# Patient Record
Sex: Male | Born: 1965
Health system: Southern US, Community
[De-identification: ages and names within clinical notes are randomized; demographics above are authoritative.]

## PROBLEM LIST (undated history)

## (undated) DIAGNOSIS — H269 Unspecified cataract: Secondary | ICD-10-CM

## (undated) DIAGNOSIS — R12 Heartburn: Secondary | ICD-10-CM

## (undated) DIAGNOSIS — T7840XA Allergy, unspecified, initial encounter: Secondary | ICD-10-CM

## (undated) DIAGNOSIS — Z87442 Personal history of urinary calculi: Secondary | ICD-10-CM

## (undated) DIAGNOSIS — M2550 Pain in unspecified joint: Secondary | ICD-10-CM

## (undated) DIAGNOSIS — Z8669 Personal history of other diseases of the nervous system and sense organs: Secondary | ICD-10-CM

## (undated) DIAGNOSIS — M1990 Unspecified osteoarthritis, unspecified site: Secondary | ICD-10-CM

## (undated) DIAGNOSIS — Z8619 Personal history of other infectious and parasitic diseases: Secondary | ICD-10-CM

## (undated) DIAGNOSIS — H3552 Pigmentary retinal dystrophy: Secondary | ICD-10-CM

## (undated) DIAGNOSIS — I10 Essential (primary) hypertension: Secondary | ICD-10-CM

## (undated) DIAGNOSIS — K219 Gastro-esophageal reflux disease without esophagitis: Secondary | ICD-10-CM

## (undated) HISTORY — DX: Pain in unspecified joint: M25.50

## (undated) HISTORY — DX: Allergy, unspecified, initial encounter: T78.40XA

## (undated) HISTORY — DX: Heartburn: R12

## (undated) HISTORY — PX: EYE SURGERY: SHX253

## (undated) HISTORY — DX: Pigmentary retinal dystrophy: H35.52

## (undated) HISTORY — DX: Personal history of other infectious and parasitic diseases: Z86.19

## (undated) HISTORY — DX: Hereditary hemochromatosis: E83.110

## (undated) HISTORY — DX: Personal history of other diseases of the nervous system and sense organs: Z86.69

## (undated) HISTORY — PX: JOINT REPLACEMENT: SHX530

---
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
## 1999-02-03 ENCOUNTER — Ambulatory Visit (HOSPITAL_COMMUNITY): Admission: RE | Admit: 1999-02-03 | Discharge: 1999-02-03 | Payer: Self-pay | Admitting: Specialist

## 1999-02-17 ENCOUNTER — Ambulatory Visit (HOSPITAL_COMMUNITY): Admission: RE | Admit: 1999-02-17 | Discharge: 1999-02-17 | Payer: Self-pay | Admitting: Specialist

## 1999-06-14 HISTORY — PX: CATARACT EXTRACTION BILATERAL W/ ANTERIOR VITRECTOMY: SHX1304

## 2006-05-16 ENCOUNTER — Encounter: Admission: RE | Admit: 2006-05-16 | Discharge: 2006-05-16 | Payer: Self-pay

## 2006-06-09 ENCOUNTER — Encounter: Admission: RE | Admit: 2006-06-09 | Discharge: 2006-06-09 | Payer: Self-pay | Admitting: Orthopedic Surgery

## 2007-02-05 ENCOUNTER — Encounter: Admission: RE | Admit: 2007-02-05 | Discharge: 2007-02-05 | Payer: Self-pay | Admitting: Orthopedic Surgery

## 2012-01-13 ENCOUNTER — Encounter (INDEPENDENT_AMBULATORY_CARE_PROVIDER_SITE_OTHER): Payer: Medicare Other | Admitting: Ophthalmology

## 2012-01-13 DIAGNOSIS — H27139 Posterior dislocation of lens, unspecified eye: Secondary | ICD-10-CM

## 2012-01-13 DIAGNOSIS — H2700 Aphakia, unspecified eye: Secondary | ICD-10-CM

## 2012-01-13 DIAGNOSIS — H43819 Vitreous degeneration, unspecified eye: Secondary | ICD-10-CM

## 2012-01-13 DIAGNOSIS — H35459 Secondary pigmentary degeneration, unspecified eye: Secondary | ICD-10-CM

## 2012-01-13 NOTE — H&P (Signed)
Michael Vaughan is an 46 y.o. male.   Chief Complaint:sudden loss of vision left eye HPI: Intra ocular lens dislocation left eye  Retinitis pigmentosa OU  No past surgical history on file.  No family history on file. Social History:  does not have a smoking history on file. He does not have any smokeless tobacco history on file. His alcohol and drug histories not on file.  Allergies: Allergies not on file  No prescriptions prior to admission    Review of systems otherwise negative  There were no vitals taken for this visit.  Physical exam: Mental status: oriented x3. Eyes: See eye exam associated with this date of surgery in media tab.  Scanned in by scanning center Ears, Nose, Throat: within normal limits Neck: Within Normal limits General: within normal limits Chest: Within normal limits Breast: deferred Heart: Within normal limits Abdomen: Within normal limits GU: deferred Extremities: within normal limits Skin: within normal limits  Assessment/Plan Dislocated IntraOcular Lens left eye Plan: To [facility] for Pars plana vitrectomy, removal of intraocular lens from vitreous,placement of secondary intraocular lens with suture.  Laser therapy.  Sherrie George 01/13/2012, 4:27 PM

## 2012-01-23 ENCOUNTER — Encounter (HOSPITAL_COMMUNITY): Payer: Self-pay

## 2012-01-23 MED ORDER — CEFAZOLIN SODIUM-DEXTROSE 2-3 GM-% IV SOLR
2.0000 g | INTRAVENOUS | Status: DC
  Filled 2012-01-23: qty 50

## 2012-01-24 ENCOUNTER — Ambulatory Visit (HOSPITAL_COMMUNITY): Payer: Medicare Other | Admitting: *Deleted

## 2012-01-24 ENCOUNTER — Ambulatory Visit (HOSPITAL_COMMUNITY)
Admission: RE | Admit: 2012-01-24 | Discharge: 2012-01-25 | Disposition: A | Discharge status: Home / Self Care | Payer: Medicare Other | Source: Ambulatory Visit | Attending: Ophthalmology | Admitting: Ophthalmology

## 2012-01-24 ENCOUNTER — Encounter (HOSPITAL_COMMUNITY): Payer: Self-pay | Admitting: Surgery

## 2012-01-24 ENCOUNTER — Encounter (HOSPITAL_COMMUNITY): Payer: Self-pay | Admitting: *Deleted

## 2012-01-24 ENCOUNTER — Encounter (HOSPITAL_COMMUNITY)
Admission: RE | Disposition: A | Discharge status: Home / Self Care | Payer: Self-pay | Source: Ambulatory Visit | Attending: Ophthalmology

## 2012-01-24 ENCOUNTER — Ambulatory Visit (HOSPITAL_COMMUNITY): Payer: Medicare Other

## 2012-01-24 DIAGNOSIS — H27139 Posterior dislocation of lens, unspecified eye: Secondary | ICD-10-CM

## 2012-01-24 DIAGNOSIS — H43819 Vitreous degeneration, unspecified eye: Secondary | ICD-10-CM | POA: Insufficient documentation

## 2012-01-24 DIAGNOSIS — Y831 Surgical operation with implant of artificial internal device as the cause of abnormal reaction of the patient, or of later complication, without mention of misadventure at the time of the procedure: Secondary | ICD-10-CM | POA: Insufficient documentation

## 2012-01-24 DIAGNOSIS — T8522XA Displacement of intraocular lens, initial encounter: Secondary | ICD-10-CM | POA: Diagnosis present

## 2012-01-24 DIAGNOSIS — H3552 Pigmentary retinal dystrophy: Secondary | ICD-10-CM | POA: Insufficient documentation

## 2012-01-24 DIAGNOSIS — H43399 Other vitreous opacities, unspecified eye: Secondary | ICD-10-CM | POA: Insufficient documentation

## 2012-01-24 DIAGNOSIS — T8529XA Other mechanical complication of intraocular lens, initial encounter: Secondary | ICD-10-CM | POA: Insufficient documentation

## 2012-01-24 HISTORY — DX: Unspecified osteoarthritis, unspecified site: M19.90

## 2012-01-24 HISTORY — PX: PARS PLANA VITRECTOMY: SHX2166

## 2012-01-24 HISTORY — DX: Unspecified cataract: H26.9

## 2012-01-24 LAB — CBC
Hemoglobin: 15 g/dL (ref 13.0–17.0)
MCH: 32.2 pg (ref 26.0–34.0)
MCHC: 35.1 g/dL (ref 30.0–36.0)
MCV: 91.6 fL (ref 78.0–100.0)
Platelets: 261 10*3/uL (ref 150–400)
RBC: 4.66 MIL/uL (ref 4.22–5.81)
RDW: 13.1 % (ref 11.5–15.5)
WBC: 9.9 10*3/uL (ref 4.0–10.5)

## 2012-01-24 LAB — SURGICAL PCR SCREEN
MRSA, PCR: NEGATIVE
Staphylococcus aureus: NEGATIVE

## 2012-01-24 SURGERY — PARS PLANA VITRECTOMY WITH 25G REMOVAL/SUTURE INTRAOCULAR LENS
Anesthesia: General | Site: Eye | Laterality: Left | Wound class: Clean

## 2012-01-24 MED ORDER — ACETAZOLAMIDE SODIUM 500 MG IJ SOLR
500.0000 mg | Freq: Once | INTRAMUSCULAR | Status: AC
  Administered 2012-01-25: 500 mg via INTRAVENOUS
  Filled 2012-01-24: qty 500

## 2012-01-24 MED ORDER — TRIAMCINOLONE ACETONIDE 40 MG/ML IJ SUSP
INTRAMUSCULAR | Status: AC
  Filled 2012-01-24: qty 1

## 2012-01-24 MED ORDER — TROPICAMIDE 1 % OP SOLN
OPHTHALMIC | Status: AC
  Administered 2012-01-24: 1 [drp] via OPHTHALMIC
  Filled 2012-01-24: qty 3

## 2012-01-24 MED ORDER — HYDROMORPHONE HCL PF 1 MG/ML IJ SOLN
INTRAMUSCULAR | Status: AC
  Administered 2012-01-24: 0.5 mg via INTRAVENOUS
  Filled 2012-01-24: qty 1

## 2012-01-24 MED ORDER — GLYCOPYRROLATE 0.2 MG/ML IJ SOLN
INTRAMUSCULAR | Status: DC | PRN
  Administered 2012-01-24: .5 mg via INTRAVENOUS

## 2012-01-24 MED ORDER — BSS PLUS IO SOLN
INTRAOCULAR | Status: AC
  Filled 2012-01-24: qty 500

## 2012-01-24 MED ORDER — LIDOCAINE HCL 4 % MT SOLN
OROMUCOSAL | Status: DC | PRN
  Administered 2012-01-24: 4 mL via TOPICAL

## 2012-01-24 MED ORDER — BUPIVACAINE HCL 0.75 % IJ SOLN
INTRAMUSCULAR | Status: DC | PRN
  Administered 2012-01-24: 10 mL

## 2012-01-24 MED ORDER — MORPHINE SULFATE 2 MG/ML IJ SOLN: 1.0000 mg | INTRAMUSCULAR | Status: DC | PRN

## 2012-01-24 MED ORDER — BRIMONIDINE TARTRATE 0.2 % OP SOLN
1.0000 [drp] | Freq: Two times a day (BID) | OPHTHALMIC | Status: DC
  Filled 2012-01-24: qty 5

## 2012-01-24 MED ORDER — ONDANSETRON HCL 4 MG/2ML IJ SOLN: 4.0000 mg | Freq: Once | INTRAMUSCULAR | Status: DC | PRN

## 2012-01-24 MED ORDER — POLYMYXIN B SULFATE 500000 UNITS IJ SOLR
INTRAMUSCULAR | Status: AC
  Filled 2012-01-24: qty 1

## 2012-01-24 MED ORDER — SODIUM HYALURONATE 10 MG/ML IO SOLN
INTRAOCULAR | Status: DC | PRN
  Administered 2012-01-24: 0.85 mL via INTRAOCULAR

## 2012-01-24 MED ORDER — BACITRACIN-POLYMYXIN B 500-10000 UNIT/GM OP OINT
TOPICAL_OINTMENT | OPHTHALMIC | Status: AC
  Filled 2012-01-24: qty 3.5

## 2012-01-24 MED ORDER — ACETAMINOPHEN 325 MG PO TABS: 325.0000 mg | ORAL_TABLET | ORAL | Status: DC | PRN

## 2012-01-24 MED ORDER — ONDANSETRON HCL 4 MG/2ML IJ SOLN: 4.0000 mg | Freq: Four times a day (QID) | INTRAMUSCULAR | Status: DC | PRN

## 2012-01-24 MED ORDER — ATROPINE SULFATE 1 % OP SOLN
OPHTHALMIC | Status: AC
  Filled 2012-01-24: qty 2

## 2012-01-24 MED ORDER — HYALURONIDASE HUMAN 150 UNIT/ML IJ SOLN
INTRAMUSCULAR | Status: AC
  Filled 2012-01-24: qty 1

## 2012-01-24 MED ORDER — LATANOPROST 0.005 % OP SOLN
1.0000 [drp] | Freq: Every day | OPHTHALMIC | Status: DC
  Filled 2012-01-24: qty 2.5

## 2012-01-24 MED ORDER — BSS IO SOLN
INTRAOCULAR | Status: DC | PRN
  Administered 2012-01-24: 500 mL via INTRAOCULAR

## 2012-01-24 MED ORDER — LIDOCAINE HCL (CARDIAC) 20 MG/ML IV SOLN
INTRAVENOUS | Status: DC | PRN
  Administered 2012-01-24: 60 mg via INTRAVENOUS

## 2012-01-24 MED ORDER — TEMAZEPAM 15 MG PO CAPS: 15.0000 mg | ORAL_CAPSULE | Freq: Every evening | ORAL | Status: DC | PRN

## 2012-01-24 MED ORDER — BSS PLUS IO SOLN
INTRAOCULAR | Status: DC | PRN
  Administered 2012-01-24: 1 via OPHTHALMIC

## 2012-01-24 MED ORDER — NEOSTIGMINE METHYLSULFATE 1 MG/ML IJ SOLN
INTRAMUSCULAR | Status: DC | PRN
  Administered 2012-01-24: 3 mg via INTRAVENOUS

## 2012-01-24 MED ORDER — BACITRACIN-POLYMYXIN B 500-10000 UNIT/GM OP OINT
1.0000 "application " | TOPICAL_OINTMENT | Freq: Four times a day (QID) | OPHTHALMIC | Status: DC
  Filled 2012-01-24: qty 3.5

## 2012-01-24 MED ORDER — DOCUSATE SODIUM 100 MG PO CAPS: 100.0000 mg | ORAL_CAPSULE | Freq: Two times a day (BID) | ORAL | Status: DC

## 2012-01-24 MED ORDER — DEXAMETHASONE SODIUM PHOSPHATE 10 MG/ML IJ SOLN
INTRAMUSCULAR | Status: AC
  Filled 2012-01-24: qty 1

## 2012-01-24 MED ORDER — CLINDAMYCIN PHOSPHATE 600 MG/50ML IV SOLN
INTRAVENOUS | Status: AC
  Filled 2012-01-24: qty 50

## 2012-01-24 MED ORDER — MAGNESIUM HYDROXIDE 400 MG/5ML PO SUSP: 15.0000 mL | Freq: Four times a day (QID) | ORAL | Status: DC | PRN

## 2012-01-24 MED ORDER — SODIUM CHLORIDE 0.9 % IJ SOLN
INTRAMUSCULAR | Status: DC | PRN
  Administered 2012-01-24: 10 mL

## 2012-01-24 MED ORDER — MINERAL OIL LIGHT 100 % EX OIL
TOPICAL_OIL | CUTANEOUS | Status: AC
  Filled 2012-01-24: qty 25

## 2012-01-24 MED ORDER — TROPICAMIDE 1 % OP SOLN
1.0000 [drp] | OPHTHALMIC | Status: AC | PRN
  Administered 2012-01-24 (×3): 1 [drp] via OPHTHALMIC

## 2012-01-24 MED ORDER — BUPIVACAINE HCL 0.75 % IJ SOLN
INTRAMUSCULAR | Status: AC
  Filled 2012-01-24: qty 10

## 2012-01-24 MED ORDER — EPINEPHRINE HCL 1 MG/ML IJ SOLN
INTRAMUSCULAR | Status: DC | PRN
  Administered 2012-01-24: 1 mg

## 2012-01-24 MED ORDER — TROPICAMIDE 1 % OP SOLN
OPHTHALMIC | Status: AC
  Filled 2012-01-24: qty 3

## 2012-01-24 MED ORDER — GENTAMICIN SULFATE 40 MG/ML IJ SOLN
INTRAMUSCULAR | Status: DC | PRN
  Administered 2012-01-24: 80 mg

## 2012-01-24 MED ORDER — ROCURONIUM BROMIDE 100 MG/10ML IV SOLN
INTRAVENOUS | Status: DC | PRN
  Administered 2012-01-24: 50 mg via INTRAVENOUS

## 2012-01-24 MED ORDER — GATIFLOXACIN 0.5 % OP SOLN: 1.0000 [drp] | OPHTHALMIC | Status: DC | PRN

## 2012-01-24 MED ORDER — SODIUM CHLORIDE 0.9 % IV SOLN
INTRAVENOUS | Status: DC | PRN
  Administered 2012-01-24: 12:00:00 via INTRAVENOUS

## 2012-01-24 MED ORDER — SODIUM CHLORIDE 0.9 % IV SOLN
INTRAVENOUS | Status: DC
  Administered 2012-01-24: 11:00:00 via INTRAVENOUS

## 2012-01-24 MED ORDER — HYPROMELLOSE (GONIOSCOPIC) 2.5 % OP SOLN
OPHTHALMIC | Status: AC
  Filled 2012-01-24: qty 15

## 2012-01-24 MED ORDER — GENTAMICIN SULFATE 40 MG/ML IJ SOLN
INTRAMUSCULAR | Status: AC
  Filled 2012-01-24: qty 2

## 2012-01-24 MED ORDER — CYCLOPENTOLATE HCL 1 % OP SOLN: 1.0000 [drp] | OPHTHALMIC | Status: DC | PRN

## 2012-01-24 MED ORDER — BSS IO SOLN
INTRAOCULAR | Status: AC
  Filled 2012-01-24: qty 15

## 2012-01-24 MED ORDER — MUPIROCIN 2 % EX OINT
TOPICAL_OINTMENT | CUTANEOUS | Status: AC
  Administered 2012-01-24: 1 via NASAL
  Filled 2012-01-24: qty 22

## 2012-01-24 MED ORDER — TETRACAINE HCL 0.5 % OP SOLN
2.0000 [drp] | Freq: Once | OPHTHALMIC | Status: DC
  Filled 2012-01-24: qty 2

## 2012-01-24 MED ORDER — MUPIROCIN 2 % EX OINT
TOPICAL_OINTMENT | Freq: Two times a day (BID) | CUTANEOUS | Status: DC
  Administered 2012-01-24: 1 via NASAL
  Filled 2012-01-24: qty 22

## 2012-01-24 MED ORDER — GATIFLOXACIN 0.5 % OP SOLN
OPHTHALMIC | Status: AC
  Filled 2012-01-24: qty 2.5

## 2012-01-24 MED ORDER — PROPOFOL 10 MG/ML IV EMUL
INTRAVENOUS | Status: DC | PRN
  Administered 2012-01-24: 200 mg via INTRAVENOUS

## 2012-01-24 MED ORDER — MIDAZOLAM HCL 5 MG/5ML IJ SOLN
INTRAMUSCULAR | Status: DC | PRN
  Administered 2012-01-24: 2 mg via INTRAVENOUS

## 2012-01-24 MED ORDER — TROPICAMIDE 1 % OP SOLN: 1.0000 [drp] | OPHTHALMIC | Status: DC | PRN

## 2012-01-24 MED ORDER — SODIUM CHLORIDE 0.45 % IV SOLN
INTRAVENOUS | Status: DC
  Administered 2012-01-24: 20 mL/h via INTRAVENOUS

## 2012-01-24 MED ORDER — HYDROMORPHONE HCL PF 1 MG/ML IJ SOLN
0.2500 mg | INTRAMUSCULAR | Status: DC | PRN
  Administered 2012-01-24 (×3): 0.5 mg via INTRAVENOUS

## 2012-01-24 MED ORDER — LIDOCAINE HCL 2 % IJ SOLN
INTRAMUSCULAR | Status: AC
  Filled 2012-01-24: qty 1

## 2012-01-24 MED ORDER — POLYMYXIN B SULFATE 500000 UNITS IJ SOLR
INTRAMUSCULAR | Status: DC | PRN
  Administered 2012-01-24: 500000 [IU]

## 2012-01-24 MED ORDER — ACETYLCHOLINE CHLORIDE 1:100 IO SOLR
INTRAOCULAR | Status: AC
  Filled 2012-01-24: qty 1

## 2012-01-24 MED ORDER — PHENYLEPHRINE HCL 2.5 % OP SOLN
1.0000 [drp] | OPHTHALMIC | Status: DC | PRN
  Administered 2012-01-24 (×2): 1 [drp] via OPHTHALMIC

## 2012-01-24 MED ORDER — CYCLOPENTOLATE HCL 1 % OP SOLN
OPHTHALMIC | Status: AC
  Administered 2012-01-24: 1 [drp] via OPHTHALMIC
  Filled 2012-01-24: qty 2

## 2012-01-24 MED ORDER — PHENYLEPHRINE HCL 2.5 % OP SOLN
OPHTHALMIC | Status: AC
  Administered 2012-01-24: 1 [drp] via OPHTHALMIC
  Filled 2012-01-24: qty 3

## 2012-01-24 MED ORDER — 0.9 % SODIUM CHLORIDE (POUR BTL) OPTIME
TOPICAL | Status: DC | PRN
  Administered 2012-01-24: 1000 mL

## 2012-01-24 MED ORDER — PHENYLEPHRINE HCL 2.5 % OP SOLN
1.0000 [drp] | OPHTHALMIC | Status: DC | PRN
  Administered 2012-01-24: 1 [drp] via OPHTHALMIC

## 2012-01-24 MED ORDER — DEXAMETHASONE SODIUM PHOSPHATE 10 MG/ML IJ SOLN
INTRAMUSCULAR | Status: DC | PRN
  Administered 2012-01-24: 10 mg

## 2012-01-24 MED ORDER — CLINDAMYCIN PHOSPHATE 600 MG/50ML IV SOLN
INTRAVENOUS | Status: DC | PRN
  Administered 2012-01-24: 600 mg via INTRAVENOUS

## 2012-01-24 MED ORDER — EPINEPHRINE HCL 1 MG/ML IJ SOLN
INTRAMUSCULAR | Status: AC
  Filled 2012-01-24: qty 1

## 2012-01-24 MED ORDER — GATIFLOXACIN 0.5 % OP SOLN
1.0000 [drp] | Freq: Four times a day (QID) | OPHTHALMIC | Status: DC
  Filled 2012-01-24: qty 2.5

## 2012-01-24 MED ORDER — PREDNISOLONE ACETATE 1 % OP SUSP
1.0000 [drp] | Freq: Four times a day (QID) | OPHTHALMIC | Status: DC
  Filled 2012-01-24: qty 1
  Filled 2012-01-24: qty 5

## 2012-01-24 MED ORDER — CYCLOPENTOLATE HCL 1 % OP SOLN
1.0000 [drp] | OPHTHALMIC | Status: AC | PRN
  Administered 2012-01-24 (×3): 1 [drp] via OPHTHALMIC

## 2012-01-24 MED ORDER — GATIFLOXACIN 0.5 % OP SOLN
1.0000 [drp] | OPHTHALMIC | Status: AC | PRN
  Administered 2012-01-24 (×3): 1 [drp] via OPHTHALMIC

## 2012-01-24 MED ORDER — ACETAZOLAMIDE SODIUM 500 MG IJ SOLR
INTRAMUSCULAR | Status: AC
  Filled 2012-01-24: qty 500

## 2012-01-24 MED ORDER — BACITRACIN-POLYMYXIN B 500-10000 UNIT/GM OP OINT
TOPICAL_OINTMENT | OPHTHALMIC | Status: DC | PRN
  Administered 2012-01-24: 1 via OPHTHALMIC

## 2012-01-24 MED ORDER — GATIFLOXACIN 0.5 % OP SOLN
OPHTHALMIC | Status: AC
  Administered 2012-01-24: 1 [drp] via OPHTHALMIC
  Filled 2012-01-24: qty 2.5

## 2012-01-24 MED ORDER — SODIUM HYALURONATE 10 MG/ML IO SOLN
INTRAOCULAR | Status: AC
  Filled 2012-01-24: qty 0.85

## 2012-01-24 MED ORDER — FENTANYL CITRATE 0.05 MG/ML IJ SOLN
INTRAMUSCULAR | Status: DC | PRN
  Administered 2012-01-24: 150 ug via INTRAVENOUS

## 2012-01-24 MED ORDER — ONDANSETRON HCL 4 MG/2ML IJ SOLN
INTRAMUSCULAR | Status: DC | PRN
  Administered 2012-01-24: 4 mg via INTRAVENOUS

## 2012-01-24 MED ORDER — HYDROCODONE-ACETAMINOPHEN 5-325 MG PO TABS
1.0000 | ORAL_TABLET | ORAL | Status: DC | PRN
Start: 2012-01-24 — End: 2012-01-25
  Administered 2012-01-24: 2 via ORAL
  Filled 2012-01-24: qty 2

## 2012-01-24 SURGICAL SUPPLY — 72 items
ACCESSORY FRAGMATOME (MISCELLANEOUS) IMPLANT
APL SRG 3 HI ABS STRL LF PLS (MISCELLANEOUS)
APPLICATOR DR MATTHEWS STRL (MISCELLANEOUS) IMPLANT
BALL CTTN LRG ABS STRL LF (GAUZE/BANDAGES/DRESSINGS) ×3
BLADE EYE CATARACT 19 1.4 BEAV (BLADE) IMPLANT
BLADE KERATOME 2.75 (BLADE) ×2 IMPLANT
CANNULA FLEX TIP 25G (CANNULA) IMPLANT
CLOTH BEACON ORANGE TIMEOUT ST (SAFETY) ×2 IMPLANT
CORDS BIPOLAR (ELECTRODE) IMPLANT
COTTONBALL LRG STERILE PKG (GAUZE/BANDAGES/DRESSINGS) ×6 IMPLANT
COVER MAYO STAND STRL (DRAPES) IMPLANT
DRAPE INCISE 51X51 W/FILM STRL (DRAPES) ×2 IMPLANT
DRAPE OPHTHALMIC 77X100 STRL (CUSTOM PROCEDURE TRAY) ×2 IMPLANT
FILTER BLUE MILLIPORE (MISCELLANEOUS) IMPLANT
FORCEPS ECKARDT ILM 25G SERR (OPHTHALMIC RELATED) ×2 IMPLANT
FORCEPS HORIZONTAL 25G DISP (OPHTHALMIC RELATED) IMPLANT
GAS OPHTHALMIC (MISCELLANEOUS) IMPLANT
GAUZE PACKING FOLDED 2  STR (GAUZE/BANDAGES/DRESSINGS) ×1
GAUZE PACKING FOLDED 2 STR (GAUZE/BANDAGES/DRESSINGS) ×1 IMPLANT
GLOVE BIOGEL PI IND STRL 7.0 (GLOVE) ×1 IMPLANT
GLOVE BIOGEL PI INDICATOR 7.0 (GLOVE) ×1
GLOVE SS BIOGEL STRL SZ 6.5 (GLOVE) ×2 IMPLANT
GLOVE SS BIOGEL STRL SZ 7 (GLOVE) ×1 IMPLANT
GLOVE SUPERSENSE BIOGEL SZ 6.5 (GLOVE) ×2
GLOVE SUPERSENSE BIOGEL SZ 7 (GLOVE) ×1
GLOVE SURG 8.5 LATEX PF (GLOVE) ×2 IMPLANT
GLOVE SURG SS PI 7.0 STRL IVOR (GLOVE) ×2 IMPLANT
GOWN STRL NON-REIN LRG LVL3 (GOWN DISPOSABLE) ×6 IMPLANT
ILLUMINATOR CHOW PICK 25GA (MISCELLANEOUS) ×2 IMPLANT
KIT BASIN OR (CUSTOM PROCEDURE TRAY) ×2 IMPLANT
KIT ROOM TURNOVER OR (KITS) ×2 IMPLANT
KNIFE CRESCENT 1.75 EDGEAHEAD (BLADE) IMPLANT
LENS IOL POST 1PIECE DIOP 20.5 (Intraocular Lens) ×2 IMPLANT
MARKER SKIN DUAL TIP RULER LAB (MISCELLANEOUS) IMPLANT
MASK EYE SHIELD (GAUZE/BANDAGES/DRESSINGS) ×2 IMPLANT
MICROPICK 25G (MISCELLANEOUS)
NEEDLE 18GX1X1/2 (RX/OR ONLY) (NEEDLE) ×4 IMPLANT
NEEDLE 25GX 5/8IN NON SAFETY (NEEDLE) ×2 IMPLANT
NEEDLE 27GAX1X1/2 (NEEDLE) IMPLANT
NEEDLE FILTER BLUNT 18X 1/2SAF (NEEDLE) ×1
NEEDLE FILTER BLUNT 18X1 1/2 (NEEDLE) ×1 IMPLANT
NEEDLE HYPO 30X.5 LL (NEEDLE) ×2 IMPLANT
NS IRRIG 1000ML POUR BTL (IV SOLUTION) ×2 IMPLANT
PACK VITRECTOMY CUSTOM (CUSTOM PROCEDURE TRAY) ×2 IMPLANT
PAD ARMBOARD 7.5X6 YLW CONV (MISCELLANEOUS) ×4 IMPLANT
PAD EYE OVAL STERILE LF (GAUZE/BANDAGES/DRESSINGS) IMPLANT
PAK VITRECTOMY PIK 25 GA (OPHTHALMIC RELATED) ×2 IMPLANT
PENCIL BIPOLAR 25GA STR DISP (OPHTHALMIC RELATED) IMPLANT
PICK MICROPICK 25G (MISCELLANEOUS) IMPLANT
PROBE DIRECTIONAL LASER (MISCELLANEOUS) IMPLANT
PROBE VITREOUS 25+ ACCURUS (MISCELLANEOUS) IMPLANT
ROLLS DENTAL (MISCELLANEOUS) ×4 IMPLANT
SCRAPER DIAMOND 25GA (OPHTHALMIC RELATED) IMPLANT
SPEAR EYE SURG WECK-CEL (MISCELLANEOUS) ×2 IMPLANT
SPONGE SURGIFOAM ABS GEL 12-7 (HEMOSTASIS) ×2 IMPLANT
STOPCOCK 4 WAY LG BORE MALE ST (IV SETS) IMPLANT
SUT CHROMIC 7 0 TG140 8 (SUTURE) ×2 IMPLANT
SUT ETHILON 10 0 CS140 6 (SUTURE) ×2 IMPLANT
SUT ETHILON 9 0 BV100 4 (SUTURE) IMPLANT
SUT POLY NON ABSORB 10-0 8 STR (SUTURE) ×4 IMPLANT
SUT SILK 4 0 RB 1 (SUTURE) ×2 IMPLANT
SYR 20CC LL (SYRINGE) ×2 IMPLANT
SYR 5ML LL (SYRINGE) IMPLANT
SYR BULB 3OZ (MISCELLANEOUS) ×2 IMPLANT
SYR TB 1ML LUER SLIP (SYRINGE) IMPLANT
SYRINGE 10CC LL (SYRINGE) ×2 IMPLANT
TAPE SURG TRANSPORE 1 IN (GAUZE/BANDAGES/DRESSINGS) ×1 IMPLANT
TAPE SURGICAL TRANSPORE 1 IN (GAUZE/BANDAGES/DRESSINGS) ×1
TOWEL OR 17X24 6PK STRL BLUE (TOWEL DISPOSABLE) ×6 IMPLANT
TROCAR CANNULA 25GA (CANNULA) IMPLANT
WATER STERILE IRR 1000ML POUR (IV SOLUTION) ×2 IMPLANT
WIPE INSTRUMENT VISIWIPE 73X73 (MISCELLANEOUS) ×2 IMPLANT

## 2012-01-24 NOTE — Anesthesia Preprocedure Evaluation (Signed)
Anesthesia Evaluation  Patient identified by MRN, date of birth, ID band Patient awake    Reviewed: Allergy & Precautions, H&P , NPO status , Patient's Chart, lab work & pertinent test results  Airway Mallampati: I TM Distance: >3 FB Neck ROM: full    Dental   Pulmonary          Cardiovascular Rhythm:regular Rate:Normal     Neuro/Psych    GI/Hepatic   Endo/Other    Renal/GU      Musculoskeletal   Abdominal   Peds  Hematology   Anesthesia Other Findings   Reproductive/Obstetrics                           Anesthesia Physical Anesthesia Plan  ASA: I  Anesthesia Plan: General   Post-op Pain Management:    Induction: Intravenous  Airway Management Planned: Oral ETT  Additional Equipment:   Intra-op Plan:   Post-operative Plan: Extubation in OR  Informed Consent: I have reviewed the patients History and Physical, chart, labs and discussed the procedure including the risks, benefits and alternatives for the proposed anesthesia with the patient or authorized representative who has indicated his/her understanding and acceptance.     Plan Discussed with: CRNA, Anesthesiologist and Surgeon  Anesthesia Plan Comments:         Anesthesia Quick Evaluation  

## 2012-01-24 NOTE — Progress Notes (Signed)
Report given ro kay rn as caregiver

## 2012-01-24 NOTE — Anesthesia Postprocedure Evaluation (Signed)
  Anesthesia Post-op Note  Patient: Michael Vaughan  Procedure(s) Performed: Procedure(s) (LRB): PARS PLANA VITRECTOMY WITH 25G REMOVAL/SUTURE INTRAOCULAR LENS (Left)  Patient Location: PACU  Anesthesia Type: General  Level of Consciousness: awake  Airway and Oxygen Therapy: Patient Spontanous Breathing  Post-op Pain: mild  Post-op Assessment: Post-op Vital signs reviewed, Patient's Cardiovascular Status Stable, Respiratory Function Stable, Patent Airway, No signs of Nausea or vomiting and Pain level controlled  Post-op Vital Signs: stable  Complications: No apparent anesthesia complications

## 2012-01-24 NOTE — Op Note (Signed)
Brief Operative note   Preoperative diagnosis:  Pre-Op Diagnosis Codes:    * Posterior dislocation of lens [379.34] Postoperative diagnosis  Post-Op Diagnosis Codes:    * Posterior dislocation of lens [379.34]  Procedures: Vitrectomy to remove IOL from the vitreous and placement of secondary IOL with suture  Left eye  Surgeon:  Sherrie George, MD...  Assistant:  Rosalie Doctor SA    Anesthesia: General  Specimen: none  Estimated blood loss:  1cc  Complications: none  Patient sent to PACU in good condition  Composed by Sherrie George MD  Dictation number: [phone]

## 2012-01-24 NOTE — Transfer of Care (Signed)
Immediate Anesthesia Transfer of Care Note  Patient: Michael Vaughan  Procedure(s) Performed: Procedure(s) (LRB): PARS PLANA VITRECTOMY WITH 25G REMOVAL/SUTURE INTRAOCULAR LENS (Left)  Patient Location: PACU  Anesthesia Type: General  Level of Consciousness: awake, oriented and patient cooperative  Airway & Oxygen Therapy: Patient Spontanous Breathing and Patient connected to nasal cannula oxygen  Post-op Assessment: Report given to PACU RN, Post -op Vital signs reviewed and stable and Patient moving all extremities X 4  Post vital signs: Reviewed and stable  Complications: No apparent anesthesia complications

## 2012-01-24 NOTE — Preoperative (Signed)
Beta Blockers   Reason not to administer Beta Blockers:Not Applicable 

## 2012-01-24 NOTE — H&P (Signed)
I examined the patient today and there is no change in the medical status 

## 2012-01-24 NOTE — Anesthesia Procedure Notes (Addendum)
Performed by: Glendora Score A   Procedure Name: Intubation Date/Time: 01/24/2012 11:50 AM Performed by: Glendora Score A Pre-anesthesia Checklist: Patient identified, Emergency Drugs available, Suction available and Patient being monitored Patient Re-evaluated:Patient Re-evaluated prior to inductionOxygen Delivery Method: Circle system utilized Preoxygenation: Pre-oxygenation with 100% oxygen Intubation Type: IV induction Ventilation: Mask ventilation without difficulty Laryngoscope Size: Mac and 3 Grade View: Grade I Tube type: Oral Tube size: 7.5 mm Number of attempts: 1 Airway Equipment and Method: Stylet and LTA kit utilized Placement Confirmation: ETT inserted through vocal cords under direct vision,  positive ETCO2 and breath sounds checked- equal and bilateral Secured at: 22 cm Tube secured with: Tape Dental Injury: Teeth and Oropharynx as per pre-operative assessment

## 2012-01-25 ENCOUNTER — Encounter (HOSPITAL_COMMUNITY): Payer: Self-pay | Admitting: General Practice

## 2012-01-25 DIAGNOSIS — H27139 Posterior dislocation of lens, unspecified eye: Secondary | ICD-10-CM | POA: Diagnosis present

## 2012-01-25 MED ORDER — PREDNISOLONE ACETATE 1 % OP SUSP: 1.0000 [drp] | Freq: Four times a day (QID) | OPHTHALMIC | Status: AC

## 2012-01-25 MED ORDER — GATIFLOXACIN 0.5 % OP SOLN: 1.0000 [drp] | Freq: Four times a day (QID) | OPHTHALMIC | Status: DC

## 2012-01-25 MED ORDER — BACITRACIN-POLYMYXIN B 500-10000 UNIT/GM OP OINT: 1.0000 "application " | TOPICAL_OINTMENT | Freq: Four times a day (QID) | OPHTHALMIC | Status: AC

## 2012-01-25 NOTE — Discharge Summary (Signed)
Discharge summary not needed on [facility] patients per medical records. 

## 2012-01-25 NOTE — Progress Notes (Signed)
Discharge home.

## 2012-01-25 NOTE — Op Note (Signed)
NAMERALF, KONOPKA               ACCOUNT NO.:  [id number]  MEDICAL RECORD NO.:  [id number]  LOCATION:  [facility]                         FACILITY:  [facility]  PHYSICIAN:  Beulah Gandy. [name], [name] DATE OF BIRTH:  1965/09/03  DATE OF PROCEDURE:  01/24/2012 DATE OF DISCHARGE:                              OPERATIVE REPORT   ADMISSION DIAGNOSES:  Dislocated intraocular lens, left eye.  Vitreous degeneration and opacities, left eye.  Retinitis pigmentosa, left eye.  PROCEDURES:  Pars plana vitrectomy, removal of dislocated intraocular lens from the vitreous cavity, placement of secondary intraocular lens with suture in the left eye.  SURGEON:  Beulah Gandy. [name], [name]  ASSISTANT:  Rosalie Doctor, SA.  ANESTHESIA:  General.  DETAILS:  Usual prep and drape, a half thickness scleral flaps were raised at 3 and 9 o'clock in anticipation of IOL suture.  A corneoscleral wound was performed and created in a three-layered fashion from 10 o'clock to 2 o'clock in anticipation of IOL removal and replacement, 25-gauge trocars placed at 10 o'clock, 2 o'clock, and 4 o'clock with infusion at 4 o'clock.  The pars plana vitrectomy was begun just behind the pupillary axis where capsular remnants were removed and the vitreous was removed.  The vitrectomy was carried posteriorly and temporally until the intraocular lens was identified.  The lens was cleaned from its attachments to the vitreous strands.  A corneoscleral wound was opened with keratome.  The lens was passed from posterior segment to the anterior segment, and then dialed out of the eye and removed with a Sinskey hook.  The inner lens was sent for identification.  Additional vitrectomy was carried out.  Now removing all vitreous to the wound.  Some strands were tight and had to be removed with the vitreous cutter and the Sinskey hook.  The pupil became round.  Two Prolene sutures were placed from 3 o'clock to 9 o'clock posterior to the iris and anterior  to the ciliary sulcus.  These sutures were drawn out through the corneoscleral wound.  A new intraocular lens made by [employer] model CD7OBD, power 20.5D, length 12.5 mm, optic 7.0 mm serial #[id number] was brought on the field, inspected and cleaned.  The Prolene sutures were attached to the eyelets of the lens.  Provisc was placed in the anterior chamber.  The intraocular lens was passed into the anterior chamber, then into the posterior chamber.  It was dialed into place and the Prolene sutures were knotted beneath the scleral flaps.  The implant was found to be secure at this point.  The corneal wound was closed with 5 interrupted 10-0 nylon sutures.  The wounds were tested and found to be secured. Additional pars plana vitrectomy was performed at this time with Provisc being placed on the corneal surface.  The vitrectomy was carried to the mid periphery in a core fashion and then also into the far periphery until all vitreous and vitreous granules were removed.  The instruments removed from the eye and the 25-gauge trocars were removed from the eye. The wounds were tested and found to be secure.  The conjunctiva was closed with 7-0 chromic suture.  Polymyxin and gentamicin were irrigated into  tenon space.  Marcaine was injected around the globe for postop pain. Decadron 10 mg was injected into the lower subconjunctival space.  The closing pressure was 10 with a Barraquer tonometer.  Polysporin patch and shield were placed.  The patient was awakened, taken to recovery in satisfactory condition.     Beulah Gandy. [name], [name]     JDM/MEDQ  D:  01/24/2012  T:  01/25/2012  Job:  [id number]

## 2012-01-25 NOTE — Progress Notes (Signed)
01/25/2012, 6:55 AM  Mental Status:  Awake, Alert, Oriented  Anterior segment: Cornea  Clear, wound intact    Anterior Chamber Clear    Lens:   IOL well centered  Intra Ocular Pressure 16 mmHg with Tonopen  Vitreous: Clear   Retina:  Attached   Impression: Excellent result Retina attached   Final Diagnosis: Active Problems:  dislocated intraocular lens left eye    Plan: start post operative eye drops.  Discharge to home.  Give post operative instructions  Sherrie George 01/25/2012, 6:55 AM

## 2012-01-26 ENCOUNTER — Encounter (HOSPITAL_COMMUNITY): Payer: Self-pay | Admitting: Ophthalmology

## 2012-01-31 ENCOUNTER — Inpatient Hospital Stay (INDEPENDENT_AMBULATORY_CARE_PROVIDER_SITE_OTHER): Payer: Medicare Other | Admitting: Ophthalmology

## 2012-01-31 DIAGNOSIS — H2700 Aphakia, unspecified eye: Secondary | ICD-10-CM

## 2012-02-06 ENCOUNTER — Encounter (INDEPENDENT_AMBULATORY_CARE_PROVIDER_SITE_OTHER): Payer: Medicare Other | Admitting: Ophthalmology

## 2012-02-07 ENCOUNTER — Ambulatory Visit (INDEPENDENT_AMBULATORY_CARE_PROVIDER_SITE_OTHER): Payer: Medicare Other | Admitting: Ophthalmology

## 2012-02-07 DIAGNOSIS — H2700 Aphakia, unspecified eye: Secondary | ICD-10-CM

## 2012-02-17 ENCOUNTER — Ambulatory Visit (INDEPENDENT_AMBULATORY_CARE_PROVIDER_SITE_OTHER): Payer: Medicare Other | Admitting: Ophthalmology

## 2012-02-17 DIAGNOSIS — H2700 Aphakia, unspecified eye: Secondary | ICD-10-CM

## 2012-03-19 ENCOUNTER — Encounter (INDEPENDENT_AMBULATORY_CARE_PROVIDER_SITE_OTHER): Payer: Medicare Other | Admitting: Ophthalmology

## 2012-03-19 DIAGNOSIS — H2700 Aphakia, unspecified eye: Secondary | ICD-10-CM

## 2012-05-24 ENCOUNTER — Ambulatory Visit (INDEPENDENT_AMBULATORY_CARE_PROVIDER_SITE_OTHER): Payer: Medicare Other | Admitting: Family Medicine

## 2012-05-24 ENCOUNTER — Encounter: Payer: Self-pay | Admitting: Family Medicine

## 2012-05-24 VITALS — BP 128/80 | HR 60 | Temp 97.9°F | Ht 70.0 in | Wt 166.0 lb

## 2012-05-24 DIAGNOSIS — Z83438 Family history of other disorder of lipoprotein metabolism and other lipidemia: Secondary | ICD-10-CM

## 2012-05-24 DIAGNOSIS — M19041 Primary osteoarthritis, right hand: Secondary | ICD-10-CM | POA: Insufficient documentation

## 2012-05-24 DIAGNOSIS — Z125 Encounter for screening for malignant neoplasm of prostate: Secondary | ICD-10-CM

## 2012-05-24 DIAGNOSIS — H3552 Pigmentary retinal dystrophy: Secondary | ICD-10-CM

## 2012-05-24 DIAGNOSIS — Z0000 Encounter for general adult medical examination without abnormal findings: Secondary | ICD-10-CM | POA: Insufficient documentation

## 2012-05-24 DIAGNOSIS — M129 Arthropathy, unspecified: Secondary | ICD-10-CM

## 2012-05-24 DIAGNOSIS — R12 Heartburn: Secondary | ICD-10-CM

## 2012-05-24 DIAGNOSIS — Z23 Encounter for immunization: Secondary | ICD-10-CM

## 2012-05-24 DIAGNOSIS — M1990 Unspecified osteoarthritis, unspecified site: Secondary | ICD-10-CM

## 2012-05-24 DIAGNOSIS — Z79899 Other long term (current) drug therapy: Secondary | ICD-10-CM

## 2012-05-24 NOTE — Patient Instructions (Addendum)
Get Dr. Anastasio Auerbach or Digby's advice on AREDS 2 multivitamin. Tdap today (tetanus and pertussis). Return at your convenience fasting for blood work. Good to see you today, call us with questions.

## 2012-05-24 NOTE — Progress Notes (Addendum)
Subjective:    Patient ID: Michael Vaughan, male    DOB: May 04, 1966, 46 y.o.   MRN: [id number]  HPI CC: new pt to establish, requests CPE  H/o retinitis pigmentosa (Dr. Hazle Quant and [name]).  H/o cataract extraction 2001.  Lens replacement L eye 01/2012.  Has f/u planned next week.   Arthropathy - long-term aleve.  Since 1990s.  Works well for arthritis.  Tylenol doesn't help.  Does bleed easily   Heartburn - controlled with tums.  Preventative: Not fasting today. Prostate cancer screening - brother developed prostate cancer at age 10 yo.  Discussed.  Would like to start screening today. Tdap - requests today. 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  Past histories reviewed and updated if relevant as below. Patient Active Problem List  Diagnosis  . Dislocated IOL (intraocular lens), posterior   Past Medical History  Diagnosis Date  . Arthritis     takes aleve regularly  . Retinitis pigmentosa   . Heartburn   . History of chicken pox   . History of migraine 1990s    remote   Past Surgical History  Procedure Date  . Cataract extraction bilateral w/ anterior vitrectomy 2001  . Hernia repair 1972    at age 23  . Pars plana vitrectomy 01/24/2012    Procedure: PARS PLANA VITRECTOMY WITH 25G REMOVAL/SUTURE INTRAOCULAR LENS;  Surgeon: Sherrie George, MD;  Location: [facility] OR;  Service: Ophthalmology;  Laterality: Left;   History  Substance Use Topics  . Smoking status: Former Smoker -- 1.0 packs/day for 20 years    Types: Cigarettes    Quit date: 03/12/2004  . Smokeless tobacco: Current User    Types: Snuff, Chew     Comment: quit in 2005, occasional smokeless tobacco  . Alcohol Use: No    Family History  Problem Relation Age of Onset  . Cancer Paternal Aunt     colon  . Cancer Father     CLL spread to brain  . Cancer Paternal Aunt     breast   . Cancer Brother 71    prostate  . Hyperlipidemia Other     father's side  . CAD Paternal Uncle     MI, CABG  . Stroke Maternal Grandmother   . Diabetes Other     mother's side  . Hypertension Father     and mother and family   Allergies  Allergen Reactions  . Augmentin (Amoxicillin-Pot Clavulanate) Shortness Of Breath, Swelling and Rash    No problems with amox   No current outpatient prescriptions on file prior to visit.    Review of Systems  Constitutional: Negative for fever, chills, activity change, appetite change, fatigue and unexpected weight change.  HENT: Negative for hearing loss and neck pain.   Eyes: Positive for visual disturbance.  Respiratory: Negative for cough, chest tightness, shortness of breath and wheezing.   Cardiovascular: Negative for chest pain, palpitations and leg swelling.  Gastrointestinal: Negative for nausea, vomiting, abdominal pain, diarrhea, constipation, blood in stool and abdominal distention.  Genitourinary: Negative for hematuria and difficulty urinating.  Musculoskeletal: Negative for myalgias and arthralgias.  Skin: Negative for rash.  Neurological: Negative for dizziness, seizures, syncope and headaches.  Hematological: Bruises/bleeds easily (attributes to  aleve).  Psychiatric/Behavioral: Negative for dysphoric mood. The patient is not nervous/anxious.        Objective:   Physical Exam  Nursing note and vitals reviewed. Constitutional: He is oriented to person, place, and time. He appears well-developed and well-nourished. No distress.  HENT:  Head: Normocephalic and atraumatic.  Right Ear: Hearing, tympanic membrane, external ear and ear canal normal.  Left Ear: Hearing, tympanic membrane, external ear and ear canal normal.  Nose: Nose normal.  Mouth/Throat:  Oropharynx is clear and moist. No oropharyngeal exudate.  Eyes: Conjunctivae normal and EOM are normal. Pupils are equal, round, and reactive to light. No scleral icterus.  Neck: Normal range of motion. Neck supple. No thyromegaly present.  Cardiovascular: Normal rate, regular rhythm, normal heart sounds and intact distal pulses.   No murmur heard. Pulses:      Radial pulses are 2+ on the right side, and 2+ on the left side.  Pulmonary/Chest: Effort normal and breath sounds normal. No respiratory distress. He has no wheezes. He has no rales.  Abdominal: Soft. Bowel sounds are normal. He exhibits no distension and no mass. There is no tenderness. There is no rebound and no guarding.  Genitourinary: Rectum normal and prostate normal. Rectal exam shows no external hemorrhoid, no internal hemorrhoid, no fissure, no mass, no tenderness and anal tone normal. Prostate is not enlarged (20gm) and not tender.  Musculoskeletal: Normal range of motion. He exhibits no edema.  Lymphadenopathy:    He has no cervical adenopathy.  Neurological: He is alert and oriented to person, place, and time.       CN grossly intact, station and gait intact  Skin: Skin is warm and dry. No rash noted.  Psychiatric: He has a normal mood and affect. His behavior is normal. Judgment and thought content normal.       Assessment & Plan:

## 2012-05-24 NOTE — Assessment & Plan Note (Signed)
Discussed GI risks of regular NSAID use.

## 2012-05-24 NOTE — Assessment & Plan Note (Addendum)
I have personally reviewed the Medicare Annual Wellness questionnaire and have noted 1. The patient's medical and social history 2. Their use of alcohol, tobacco or illicit drugs 3. Their current medications and supplements 4. The patient's functional ability including ADL's, fall risks, home safety risks and hearing or visual impairment. 5. Diet and physical activity 6. Evidence for depression or mood disorders The patients weight, height, BMI have been recorded in the chart.  Hearing and vision has been addressed. I have made referrals, counseling and provided education to the patient based review of the above and I have provided the pt with a written personalized care plan for preventive services. See scanned questionairre.  Reviewed preventative protocols and updated unless pt declined. Tdap today.  DRE today.  Will return for PSA when returns fasting for blood work.

## 2012-05-24 NOTE — Assessment & Plan Note (Signed)
Minimal tums use

## 2012-05-24 NOTE — Assessment & Plan Note (Signed)
Followed by ophtho regularly

## 2012-05-24 NOTE — Addendum Note (Signed)
Addended by: Sueanne Margarita on: 05/24/2012 10:39 AM   Modules accepted: Orders

## 2012-05-28 ENCOUNTER — Encounter (INDEPENDENT_AMBULATORY_CARE_PROVIDER_SITE_OTHER): Payer: Medicare Other | Admitting: Ophthalmology

## 2012-05-28 DIAGNOSIS — H348192 Central retinal vein occlusion, unspecified eye, stable: Secondary | ICD-10-CM

## 2012-05-28 DIAGNOSIS — H43819 Vitreous degeneration, unspecified eye: Secondary | ICD-10-CM

## 2012-06-07 ENCOUNTER — Encounter: Payer: Self-pay | Admitting: *Deleted

## 2012-06-07 ENCOUNTER — Other Ambulatory Visit (INDEPENDENT_AMBULATORY_CARE_PROVIDER_SITE_OTHER): Payer: Medicare Other

## 2012-06-07 DIAGNOSIS — Z0000 Encounter for general adult medical examination without abnormal findings: Secondary | ICD-10-CM

## 2012-06-07 DIAGNOSIS — Z83438 Family history of other disorder of lipoprotein metabolism and other lipidemia: Secondary | ICD-10-CM

## 2012-06-07 DIAGNOSIS — Z79899 Other long term (current) drug therapy: Secondary | ICD-10-CM

## 2012-06-07 DIAGNOSIS — Z125 Encounter for screening for malignant neoplasm of prostate: Secondary | ICD-10-CM

## 2012-06-07 DIAGNOSIS — H3552 Pigmentary retinal dystrophy: Secondary | ICD-10-CM

## 2012-06-07 DIAGNOSIS — Z8349 Family history of other endocrine, nutritional and metabolic diseases: Secondary | ICD-10-CM

## 2012-06-07 LAB — COMPREHENSIVE METABOLIC PANEL
ALT: 49 U/L (ref 0–53)
Albumin: 3.7 g/dL (ref 3.5–5.2)
CO2: 30 mEq/L (ref 19–32)
Calcium: 9 mg/dL (ref 8.4–10.5)
Chloride: 105 mEq/L (ref 96–112)
GFR: 130.55 mL/min (ref >60.00)
Sodium: 140 mEq/L (ref 135–145)
Total Protein: 6.2 g/dL (ref 6.0–8.3)

## 2012-06-07 LAB — PSA, MEDICARE: PSA: 0.13 ng/ml (ref 0.10–4.00)

## 2013-05-12 ENCOUNTER — Other Ambulatory Visit: Payer: Self-pay | Admitting: Family Medicine

## 2013-05-12 DIAGNOSIS — Z125 Encounter for screening for malignant neoplasm of prostate: Secondary | ICD-10-CM

## 2013-05-12 DIAGNOSIS — M1990 Unspecified osteoarthritis, unspecified site: Secondary | ICD-10-CM

## 2013-05-12 DIAGNOSIS — Z8042 Family history of malignant neoplasm of prostate: Secondary | ICD-10-CM

## 2013-05-13 HISTORY — DX: Hereditary hemochromatosis: E83.110

## 2013-05-17 ENCOUNTER — Other Ambulatory Visit: Payer: Medicare Other

## 2013-05-24 ENCOUNTER — Ambulatory Visit (INDEPENDENT_AMBULATORY_CARE_PROVIDER_SITE_OTHER): Payer: Medicare Other | Admitting: Family Medicine

## 2013-05-24 ENCOUNTER — Encounter: Payer: Self-pay | Admitting: Family Medicine

## 2013-05-24 VITALS — BP 118/76 | HR 60 | Temp 98.1°F | Ht 70.0 in | Wt 164.0 lb

## 2013-05-24 DIAGNOSIS — Z125 Encounter for screening for malignant neoplasm of prostate: Secondary | ICD-10-CM

## 2013-05-24 DIAGNOSIS — M129 Arthropathy, unspecified: Secondary | ICD-10-CM

## 2013-05-24 DIAGNOSIS — R7309 Other abnormal glucose: Secondary | ICD-10-CM

## 2013-05-24 DIAGNOSIS — M1990 Unspecified osteoarthritis, unspecified site: Secondary | ICD-10-CM

## 2013-05-24 DIAGNOSIS — Z0000 Encounter for general adult medical examination without abnormal findings: Secondary | ICD-10-CM

## 2013-05-24 DIAGNOSIS — R739 Hyperglycemia, unspecified: Secondary | ICD-10-CM

## 2013-05-24 DIAGNOSIS — H3552 Pigmentary retinal dystrophy: Secondary | ICD-10-CM

## 2013-05-24 LAB — CBC WITH DIFFERENTIAL/PLATELET
Basophils Absolute: 0 K/uL (ref 0.0–0.1)
Basophils Relative: 0.6 % (ref 0.0–3.0)
Eosinophils Absolute: 0.2 K/uL (ref 0.0–0.7)
Eosinophils Relative: 2.2 % (ref 0.0–5.0)
HCT: 43.7 % (ref 39.0–52.0)
Hemoglobin: 15 g/dL (ref 13.0–17.0)
Lymphocytes Relative: 35.4 % (ref 12.0–46.0)
Lymphs Abs: 2.6 K/uL (ref 0.7–4.0)
MCHC: 34.4 g/dL (ref 30.0–36.0)
MCV: 94.1 fl (ref 78.0–100.0)
Monocytes Absolute: 0.5 K/uL (ref 0.1–1.0)
Monocytes Relative: 6.3 % (ref 3.0–12.0)
Neutro Abs: 4.1 K/uL (ref 1.4–7.7)
Neutrophils Relative %: 55.5 % (ref 43.0–77.0)
Platelets: 243 K/uL (ref 150.0–400.0)
RBC: 4.64 Mil/uL (ref 4.22–5.81)
RDW: 14.1 % (ref 11.5–14.6)
WBC: 7.4 K/uL (ref 4.5–10.5)

## 2013-05-24 LAB — COMPREHENSIVE METABOLIC PANEL WITH GFR
ALT: 55 U/L — ABNORMAL HIGH (ref 0–53)
AST: 41 U/L — ABNORMAL HIGH (ref 0–37)
Albumin: 4.5 g/dL (ref 3.5–5.2)
Alkaline Phosphatase: 70 U/L (ref 39–117)
BUN: 16 mg/dL (ref 6–23)
CO2: 33 meq/L — ABNORMAL HIGH (ref 19–32)
Calcium: 9.6 mg/dL (ref 8.4–10.5)
Chloride: 102 meq/L (ref 96–112)
Creatinine, Ser: 0.7 mg/dL (ref 0.4–1.5)
GFR: 119.93 mL/min (ref >60.00)
Glucose, Bld: 82 mg/dL (ref 70–99)
Potassium: 4.9 meq/L (ref 3.5–5.1)
Sodium: 139 meq/L (ref 135–145)
Total Bilirubin: 2.4 mg/dL — ABNORMAL HIGH (ref 0.3–1.2)
Total Protein: 6.9 g/dL (ref 6.0–8.3)

## 2013-05-24 LAB — TSH: TSH: 4.07 u[IU]/mL (ref 0.35–5.50)

## 2013-05-24 LAB — PSA, MEDICARE: PSA: 0.15 ng/ml (ref 0.10–4.00)

## 2013-05-24 NOTE — Assessment & Plan Note (Signed)
Continue to f/u with ophtho

## 2013-05-24 NOTE — Patient Instructions (Signed)
Blood work today. I think you are doing well! Good to see you today, call us with questions. Return in 1 year for next physical or sooner if needed.

## 2013-05-24 NOTE — Progress Notes (Signed)
Pre-visit discussion using our clinic review tool. No additional management support is needed unless otherwise documented below in the visit note.  

## 2013-05-24 NOTE — Assessment & Plan Note (Signed)
I have personally reviewed the Medicare Annual Wellness questionnaire and have noted 1. The patient's medical and social history 2. Their use of alcohol, tobacco or illicit drugs 3. Their current medications and supplements 4. The patient's functional ability including ADL's, fall risks, home safety risks and hearing or visual impairment. 5. Diet and physical activity 6. Evidence for depression or mood disorders The patients weight, height, BMI have been recorded in the chart.  Hearing and vision has been addressed. I have made referrals, counseling and provided education to the patient based review of the above and I have provided the pt with a written personalized care plan for preventive services. See scanned questionairre.  Reviewed preventative protocols and updated unless pt declined. DRE reassuring today.  PSA today.

## 2013-05-24 NOTE — Progress Notes (Addendum)
Subjective:    Patient ID: Michael Vaughan, male    DOB: [birth year], 47 y.o.   MRN: [id number]  HPI CC: medicare wellness visit  Clif returns today for his annual exam.  H/o retinitis pigmentosa (Dr. Hazle Quant and [name]). H/o cataract extraction 2001. Lens replacement L eye 01/2012. On disability for vision loss.  Sees yearly (next appt is upcoming this month)  Arthropathy - long-term aleve use since 1990s. Tylenol doesn't help. manily hand pain occasional knee and hip and lower back pain.  No shoulder pain.  States his wife notices he has trouble gaining weight. Wt Readings from Last 3 Encounters:  05/24/13 164 lb (74.39 kg)  05/24/12 166 lb (75.297 kg)  01/23/12 155 lb (70.308 kg)    Hearing screen passed. Vision screen - known issue, sees ophtho regularly. Denies falls/depression,anhedonia,sadness.  Preventative:  Fasting today. Prostate cancer screening - brother developed prostate cancer at age 69 yo. Discussed. Would like to continue screening regularly. Tdap - 05/2012  Flu shot - 02/2013  Caffeine: 4-6 cups coffee  Lives with wife, 1 son, other grown children, dog and cows  Occupation: on disability for vision loss, legally blind, raises cows  Edu: HS  Activity: raises cows, some hunting/fishing  Diet: good water, daily fruits/vegetables   Medications and allergies reviewed and updated in chart.  Past histories reviewed and updated if relevant as below. Patient Active Problem List   Diagnosis Date Noted  . Medicare annual wellness visit, initial 05/24/2012  . Arthritis   . Retinitis pigmentosa   . Heartburn   . Dislocated IOL (intraocular lens), posterior 01/25/2012   Past Medical History  Diagnosis Date  . Arthritis     takes aleve regularly  . Retinitis pigmentosa   . Heartburn   . History of chicken pox   . History of migraine 1990s    remote   Past Surgical History  Procedure Laterality Date  . Cataract extraction bilateral w/ anterior vitrectomy   2001  . Hernia repair  1972    at age 32  . Pars plana vitrectomy  01/24/2012    Procedure: PARS PLANA VITRECTOMY WITH 25G REMOVAL/SUTURE INTRAOCULAR LENS;  Surgeon: Sherrie George, MD;  Location: [facility] OR;  Service: Ophthalmology;  Laterality: Left;   History  Substance Use Topics  . Smoking status: Former Smoker -- 1.00 packs/day for 20 years    Types: Cigarettes    Quit date: 03/12/2004  . Smokeless tobacco: Current User    Types: Snuff, Chew     Comment: quit in 2005, occasional smokeless tobacco  . Alcohol Use: No   Family History  Problem Relation Age of Onset  . Cancer Paternal Aunt     colon  . Cancer Father     CLL spread to brain  . Cancer Paternal Aunt     breast   . Cancer Brother 16    prostate  . Hyperlipidemia Other     father's side  . CAD Paternal Uncle     MI, CABG  . Stroke Maternal Grandmother   . Diabetes Other     mother's side  . Hypertension Father     and mother and family   Allergies  Allergen Reactions  . Augmentin [Amoxicillin-Pot Clavulanate] Shortness Of Breath, Swelling and Rash    No problems with amox   Current Outpatient Prescriptions on File Prior to Visit  Medication Sig Dispense Refill  . Ascorbic Acid (VITAMIN C) 1000 MG tablet Take 1,000 mg by mouth daily.      Marland Kitchen  naproxen sodium (ANAPROX) 220 MG tablet Take 440 mg by mouth daily.       . pilocarpine (PILOCAR) 1 % ophthalmic solution Place 1 drop into the left eye 2 (two) times daily.        No current facility-administered medications on file prior to visit.     Review of Systems  Constitutional: Negative for fever, chills, activity change, appetite change, fatigue and unexpected weight change.  HENT: Negative for hearing loss.   Eyes: Negative for visual disturbance.  Respiratory: Negative for cough, chest tightness, shortness of breath and wheezing.   Cardiovascular: Negative for chest pain, palpitations and leg swelling.  Gastrointestinal: Negative for nausea, vomiting,  abdominal pain, diarrhea, constipation, blood in stool and abdominal distention.  Genitourinary: Negative for hematuria and difficulty urinating.  Musculoskeletal: Negative for arthralgias, myalgias and neck pain.  Skin: Negative for rash.  Neurological: Negative for dizziness, seizures, syncope and headaches.  Hematological: Negative for adenopathy. Does not bruise/bleed easily.  Psychiatric/Behavioral: Negative for dysphoric mood. The patient is not nervous/anxious.        Objective:   Physical Exam  Nursing note and vitals reviewed. Constitutional: He is oriented to person, place, and time. He appears well-developed and well-nourished. No distress.  HENT:  Head: Normocephalic and atraumatic.  Right Ear: Hearing, tympanic membrane, external ear and ear canal normal.  Left Ear: Hearing, tympanic membrane, external ear and ear canal normal.  Nose: Nose normal.  Mouth/Throat: Oropharynx is clear and moist. No oropharyngeal exudate.  Eyes: Conjunctivae and EOM are normal. Pupils are equal, round, and reactive to light. No scleral icterus.  Neck: Normal range of motion. Neck supple. No thyromegaly present.  Cardiovascular: Normal rate, regular rhythm, normal heart sounds and intact distal pulses.   No murmur heard. Pulses:      Radial pulses are 2+ on the right side, and 2+ on the left side.  Pulmonary/Chest: Effort normal and breath sounds normal. No respiratory distress. He has no wheezes. He has no rales.  Abdominal: Soft. Bowel sounds are normal. He exhibits no distension and no mass. There is hepatomegaly (to 2 fingerwidths below costal margin). There is no splenomegaly. There is no tenderness. There is no rebound and no guarding.  Genitourinary: Rectum normal and prostate normal. Rectal exam shows no external hemorrhoid, no internal hemorrhoid, no fissure, no mass, no tenderness and anal tone normal. Prostate is not enlarged (15gm) and not tender.  Prominent prostate central divet    Musculoskeletal: Normal range of motion. He exhibits no edema.  Lymphadenopathy:    He has no cervical adenopathy.  Neurological: He is alert and oriented to person, place, and time.  CN grossly intact, station and gait intact  Skin: Skin is warm and dry. No rash noted.  Psychiatric: He has a normal mood and affect. His behavior is normal. Judgment and thought content normal.       Assessment & Plan:

## 2013-05-25 ENCOUNTER — Other Ambulatory Visit: Payer: Self-pay | Admitting: Family Medicine

## 2013-05-25 DIAGNOSIS — R160 Hepatomegaly, not elsewhere classified: Secondary | ICD-10-CM

## 2013-05-27 ENCOUNTER — Other Ambulatory Visit: Payer: Self-pay | Admitting: Family Medicine

## 2013-05-27 LAB — BILIRUBIN, FRACTIONATED(TOT/DIR/INDIR): Indirect Bilirubin: 1.6 mg/dL — ABNORMAL HIGH (ref 0.0–0.9)

## 2013-06-07 ENCOUNTER — Other Ambulatory Visit: Payer: Self-pay | Admitting: Family Medicine

## 2013-06-07 ENCOUNTER — Ambulatory Visit
Admission: RE | Admit: 2013-06-07 | Discharge: 2013-06-07 | Disposition: A | Discharge status: Home / Self Care | Payer: Medicare Other | Source: Ambulatory Visit | Attending: Family Medicine | Admitting: Family Medicine

## 2013-06-07 DIAGNOSIS — R160 Hepatomegaly, not elsewhere classified: Secondary | ICD-10-CM

## 2013-06-12 ENCOUNTER — Ambulatory Visit (INDEPENDENT_AMBULATORY_CARE_PROVIDER_SITE_OTHER): Payer: Self-pay | Admitting: Ophthalmology

## 2013-06-19 ENCOUNTER — Other Ambulatory Visit (INDEPENDENT_AMBULATORY_CARE_PROVIDER_SITE_OTHER): Payer: Medicare Other

## 2013-06-19 DIAGNOSIS — R160 Hepatomegaly, not elsewhere classified: Secondary | ICD-10-CM

## 2013-06-19 DIAGNOSIS — R7402 Elevation of levels of lactic acid dehydrogenase (LDH): Secondary | ICD-10-CM

## 2013-06-19 DIAGNOSIS — R7401 Elevation of levels of liver transaminase levels: Secondary | ICD-10-CM

## 2013-06-19 DIAGNOSIS — R740 Nonspecific elevation of levels of transaminase and lactic acid dehydrogenase [LDH]: Secondary | ICD-10-CM

## 2013-06-19 DIAGNOSIS — R17 Unspecified jaundice: Secondary | ICD-10-CM

## 2013-06-19 LAB — IBC PANEL
IRON: 176 ug/dL — AB (ref 42–165)
SATURATION RATIOS: 90.2 % — AB (ref 20.0–50.0)
TRANSFERRIN: 139.4 mg/dL — AB (ref 212.0–360.0)

## 2013-06-19 LAB — HEPATIC FUNCTION PANEL
ALBUMIN: 4 g/dL (ref 3.5–5.2)
ALK PHOS: 65 U/L (ref 39–117)
ALT: 49 U/L (ref 0–53)
AST: 36 U/L (ref 0–37)
Bilirubin, Direct: 0.1 mg/dL (ref 0.0–0.3)
TOTAL PROTEIN: 6.5 g/dL (ref 6.0–8.3)
Total Bilirubin: 1.6 mg/dL — ABNORMAL HIGH (ref 0.3–1.2)

## 2013-06-19 LAB — FERRITIN: Ferritin: >1500 ng/mL — ABNORMAL HIGH (ref 22.0–322.0)

## 2013-06-19 LAB — PROTIME-INR
INR: 1.2 ratio — ABNORMAL HIGH (ref 0.8–1.0)
Prothrombin Time: 12.6 s — ABNORMAL HIGH (ref 10.2–12.4)

## 2013-06-20 LAB — HEPATITIS PANEL, ACUTE
HCV Ab: NEGATIVE
Hep A IgM: NONREACTIVE
Hep B C IgM: NONREACTIVE
Hepatitis B Surface Ag: NEGATIVE

## 2013-06-23 ENCOUNTER — Encounter: Payer: Self-pay | Admitting: Family Medicine

## 2013-06-23 ENCOUNTER — Other Ambulatory Visit: Payer: Self-pay | Admitting: Family Medicine

## 2013-06-24 ENCOUNTER — Telehealth: Payer: Self-pay | Admitting: *Deleted

## 2013-06-24 NOTE — Telephone Encounter (Signed)
Opened in error

## 2013-06-27 ENCOUNTER — Encounter: Payer: Self-pay | Admitting: Gastroenterology

## 2013-06-27 ENCOUNTER — Other Ambulatory Visit (INDEPENDENT_AMBULATORY_CARE_PROVIDER_SITE_OTHER): Payer: Medicare Other

## 2013-07-02 LAB — HEMOCHROMATOSIS DNA-PCR(C282Y,H63D)

## 2013-07-15 ENCOUNTER — Encounter: Payer: Self-pay | Admitting: Gastroenterology

## 2013-07-15 ENCOUNTER — Ambulatory Visit (INDEPENDENT_AMBULATORY_CARE_PROVIDER_SITE_OTHER): Payer: Medicare Other | Admitting: Gastroenterology

## 2013-07-15 NOTE — Progress Notes (Signed)
    History of Present Illness: This is a 48 year old male who was recently found to have mild hepatomegaly on physical exam and on abdominal ultrasound with a liver span of 12 cm. A mild indirect hyperbilirubinemia was noted. AST and ALT were minimally elevated on LFTs in 05/2013 but LFTs have been normal on other occasions. Iron overload was diagnosed with an iron saturation of 90% and ferritin of >1500. Hemochromatosis DNA testing showed his genotype is homozygous for C282Y. He denies any family history of hemachromatosis. He has joint complaints for many years.  Review of Systems: Pertinent positive and negative review of systems were noted in the above HPI section. All other review of systems were otherwise negative.  Current Medications, Allergies, Past Medical History, Past Surgical History, Family History and Social History were reviewed in [facility] electronic medical record.  Physical Exam: General: Well developed , well nourished, no acute distress Head: Normocephalic and atraumatic Eyes:  sclerae anicteric, EOMI Ears: Normal auditory acuity Mouth: No deformity or lesions Neck: Supple, no masses or thyromegaly Lungs: Clear throughout to auscultation Heart: Regular rate and rhythm; no murmurs, rubs or bruits Abdomen: Soft, non tender and non distended. Liver edge about 3-4 cm below the costal margin, liver span by percussion appears normal. No masses, splenomegaly or hernias noted. Normal Bowel sounds Musculoskeletal: Symmetrical with no gross deformities  Skin: No lesions on visible extremities Pulses:  Normal pulses noted Extremities: No clubbing, cyanosis, edema or deformities noted Neurological: Alert oriented x 4, grossly nonfocal Cervical Nodes:  No significant cervical adenopathy Inguinal Nodes: No significant inguinal adenopathy Psychological:  Alert and cooperative. Normal mood and affect  Assessment and Recommendations:  1. Hemochromatosis. Mildly elevated  transaminases in December. Borderline hepatomegaly. He had a long discussion about hemachromatosis its clinical manifestations and long-term treatment program to include regular phlebotomies. He is looking into where he would like to have his phlebotomies performed and will contact us within a few days to begin monthly phlebotomies, along with regular CBCs, iron saturations and ferritins. I advised him to have his living first degree relatives tested for iron overload and hemachromatosis genes-his mother, his siblings and his 2 children.

## 2013-07-15 NOTE — Patient Instructions (Addendum)
You have been given a separate informational sheet regarding your tobacco use, the importance of quitting and local resources to help you quit.  After you have contacted [facility] about the charges for phlebotomy, please contact our office at [phone].  Thank you for choosing me and [facility].  Venita LickMalcolm T. Pleas KochStark, Jr., MD., Clementeen GrahamFACG

## 2013-07-22 ENCOUNTER — Telehealth: Payer: Self-pay | Admitting: Gastroenterology

## 2013-07-22 NOTE — Telephone Encounter (Signed)
[facility] will not allow patient to donate the blood.  He can have a therapeutic phlebotomy with them, but will be a monthly charge.  He would rather come here.  New orders placed per office note on 07/15/13.  He will come for his first draw next week, when his wife also has an appt in the building.

## 2013-08-09 ENCOUNTER — Other Ambulatory Visit (INDEPENDENT_AMBULATORY_CARE_PROVIDER_SITE_OTHER): Payer: Medicare Other

## 2013-08-09 LAB — CBC WITH DIFFERENTIAL/PLATELET
BASOS PCT: 0.6 % (ref 0.0–3.0)
Basophils Absolute: 0 10*3/uL (ref 0.0–0.1)
EOS ABS: 0.2 10*3/uL (ref 0.0–0.7)
Eosinophils Relative: 1.9 % (ref 0.0–5.0)
HEMATOCRIT: 43.6 % (ref 39.0–52.0)
HEMOGLOBIN: 14.3 g/dL (ref 13.0–17.0)
LYMPHS ABS: 2.4 10*3/uL (ref 0.7–4.0)
Lymphocytes Relative: 27.5 % (ref 12.0–46.0)
MCHC: 32.9 g/dL (ref 30.0–36.0)
MCV: 96.9 fl (ref 78.0–100.0)
MONO ABS: 0.4 10*3/uL (ref 0.1–1.0)
Monocytes Relative: 4.1 % (ref 3.0–12.0)
NEUTROS ABS: 5.7 10*3/uL (ref 1.4–7.7)
Neutrophils Relative %: 65.9 % (ref 43.0–77.0)
Platelets: 215 10*3/uL (ref 150.0–400.0)
RBC: 4.5 Mil/uL (ref 4.22–5.81)
RDW: 13.5 % (ref 11.5–14.6)
WBC: 8.7 10*3/uL (ref 4.5–10.5)

## 2013-08-09 LAB — FERRITIN: Ferritin: 1458.9 ng/mL — ABNORMAL HIGH (ref 22.0–322.0)

## 2013-08-09 LAB — IBC PANEL
Iron: 174 ug/dL — ABNORMAL HIGH (ref 42–165)
Saturation Ratios: 89.9 % — ABNORMAL HIGH (ref 20.0–50.0)
TRANSFERRIN: 138.3 mg/dL — AB (ref 212.0–360.0)

## 2013-09-16 ENCOUNTER — Other Ambulatory Visit (INDEPENDENT_AMBULATORY_CARE_PROVIDER_SITE_OTHER): Payer: Medicare Other

## 2013-09-16 LAB — CBC WITH DIFFERENTIAL/PLATELET
BASOS PCT: 0.4 % (ref 0.0–3.0)
Basophils Absolute: 0 10*3/uL (ref 0.0–0.1)
EOS ABS: 0.2 10*3/uL (ref 0.0–0.7)
Eosinophils Relative: 2.2 % (ref 0.0–5.0)
HEMATOCRIT: 42.5 % (ref 39.0–52.0)
HEMOGLOBIN: 14.4 g/dL (ref 13.0–17.0)
LYMPHS PCT: 30.5 % (ref 12.0–46.0)
Lymphs Abs: 2.2 10*3/uL (ref 0.7–4.0)
MCHC: 33.9 g/dL (ref 30.0–36.0)
MCV: 94.7 fl (ref 78.0–100.0)
Monocytes Absolute: 0.5 10*3/uL (ref 0.1–1.0)
Monocytes Relative: 6.9 % (ref 3.0–12.0)
NEUTROS ABS: 4.3 10*3/uL (ref 1.4–7.7)
Neutrophils Relative %: 60 % (ref 43.0–77.0)
Platelets: 217 10*3/uL (ref 150.0–400.0)
RBC: 4.49 Mil/uL (ref 4.22–5.81)
RDW: 13.3 % (ref 11.5–14.6)
WBC: 7.2 10*3/uL (ref 4.5–10.5)

## 2013-09-16 LAB — IBC PANEL
Iron: 181 ug/dL — ABNORMAL HIGH (ref 42–165)
Saturation Ratios: 88.1 % — ABNORMAL HIGH (ref 20.0–50.0)
Transferrin: 146.8 mg/dL — ABNORMAL LOW (ref 212.0–360.0)

## 2013-09-16 LAB — FERRITIN: Ferritin: 1137.9 ng/mL — ABNORMAL HIGH (ref 22.0–322.0)

## 2013-10-07 ENCOUNTER — Other Ambulatory Visit (INDEPENDENT_AMBULATORY_CARE_PROVIDER_SITE_OTHER): Payer: Medicare Other

## 2013-10-07 LAB — CBC WITH DIFFERENTIAL/PLATELET
BASOS ABS: 0 10*3/uL (ref 0.0–0.1)
Basophils Relative: 0.5 % (ref 0.0–3.0)
Eosinophils Absolute: 0.1 10*3/uL (ref 0.0–0.7)
Eosinophils Relative: 1 % (ref 0.0–5.0)
HEMATOCRIT: 43.6 % (ref 39.0–52.0)
HEMOGLOBIN: 14.6 g/dL (ref 13.0–17.0)
Lymphocytes Relative: 33 % (ref 12.0–46.0)
Lymphs Abs: 2.8 10*3/uL (ref 0.7–4.0)
MCHC: 33.6 g/dL (ref 30.0–36.0)
MCV: 95.4 fl (ref 78.0–100.0)
MONO ABS: 0.4 10*3/uL (ref 0.1–1.0)
Monocytes Relative: 4.4 % (ref 3.0–12.0)
NEUTROS ABS: 5.2 10*3/uL (ref 1.4–7.7)
Neutrophils Relative %: 61.1 % (ref 43.0–77.0)
PLATELETS: 235 10*3/uL (ref 150.0–400.0)
RBC: 4.57 Mil/uL (ref 4.22–5.81)
RDW: 13.8 % (ref 11.5–14.6)
WBC: 8.6 10*3/uL (ref 4.5–10.5)

## 2013-10-07 LAB — IBC PANEL
Iron: 207 ug/dL — ABNORMAL HIGH (ref 42–165)
Saturation Ratios: 90.9 % — ABNORMAL HIGH (ref 20.0–50.0)
Transferrin: 162.6 mg/dL — ABNORMAL LOW (ref 212.0–360.0)

## 2013-10-07 LAB — FERRITIN: FERRITIN: 1079.5 ng/mL — AB (ref 22.0–322.0)

## 2013-11-11 ENCOUNTER — Other Ambulatory Visit (INDEPENDENT_AMBULATORY_CARE_PROVIDER_SITE_OTHER): Payer: Medicare Other

## 2013-11-11 ENCOUNTER — Other Ambulatory Visit: Payer: Medicare Other

## 2013-11-11 LAB — CBC WITH DIFFERENTIAL/PLATELET
BASOS ABS: 0 10*3/uL (ref 0.0–0.1)
Basophils Relative: 0.5 % (ref 0.0–3.0)
EOS PCT: 1.2 % (ref 0.0–5.0)
Eosinophils Absolute: 0.1 10*3/uL (ref 0.0–0.7)
HEMATOCRIT: 42.1 % (ref 39.0–52.0)
HEMOGLOBIN: 14.3 g/dL (ref 13.0–17.0)
LYMPHS ABS: 2.5 10*3/uL (ref 0.7–4.0)
Lymphocytes Relative: 29 % (ref 12.0–46.0)
MCHC: 33.8 g/dL (ref 30.0–36.0)
MCV: 94.8 fl (ref 78.0–100.0)
MONO ABS: 0.5 10*3/uL (ref 0.1–1.0)
Monocytes Relative: 5.8 % (ref 3.0–12.0)
Neutro Abs: 5.6 10*3/uL (ref 1.4–7.7)
Neutrophils Relative %: 63.5 % (ref 43.0–77.0)
PLATELETS: 239 10*3/uL (ref 150.0–400.0)
RBC: 4.44 Mil/uL (ref 4.22–5.81)
RDW: 13.4 % (ref 11.5–15.5)
WBC: 8.8 10*3/uL (ref 4.0–10.5)

## 2013-12-10 ENCOUNTER — Telehealth: Payer: Self-pay | Admitting: Gastroenterology

## 2013-12-10 ENCOUNTER — Other Ambulatory Visit (INDEPENDENT_AMBULATORY_CARE_PROVIDER_SITE_OTHER): Payer: Medicare Other

## 2013-12-10 LAB — CBC WITH DIFFERENTIAL/PLATELET
BASOS ABS: 0 10*3/uL (ref 0.0–0.1)
Basophils Relative: 0.4 % (ref 0.0–3.0)
Eosinophils Absolute: 0.2 10*3/uL (ref 0.0–0.7)
Eosinophils Relative: 1.7 % (ref 0.0–5.0)
HEMATOCRIT: 44.5 % (ref 39.0–52.0)
Hemoglobin: 15.4 g/dL (ref 13.0–17.0)
LYMPHS ABS: 3.1 10*3/uL (ref 0.7–4.0)
Lymphocytes Relative: 33 % (ref 12.0–46.0)
MCHC: 34.7 g/dL (ref 30.0–36.0)
MCV: 94.7 fl (ref 78.0–100.0)
MONO ABS: 0.6 10*3/uL (ref 0.1–1.0)
Monocytes Relative: 6.1 % (ref 3.0–12.0)
Neutro Abs: 5.6 10*3/uL (ref 1.4–7.7)
Neutrophils Relative %: 58.8 % (ref 43.0–77.0)
PLATELETS: 254 10*3/uL (ref 150.0–400.0)
RBC: 4.7 Mil/uL (ref 4.22–5.81)
RDW: 13.6 % (ref 11.5–15.5)
WBC: 9.5 10*3/uL (ref 4.0–10.5)

## 2013-12-10 LAB — IBC PANEL
Iron: 205 ug/dL — ABNORMAL HIGH (ref 42–165)
Saturation Ratios: 91.6 % — ABNORMAL HIGH (ref 20.0–50.0)
Transferrin: 159.8 mg/dL — ABNORMAL LOW (ref 212.0–360.0)

## 2013-12-10 LAB — FERRITIN: Ferritin: 1156.7 ng/mL — ABNORMAL HIGH (ref 22.0–322.0)

## 2013-12-10 NOTE — Telephone Encounter (Signed)
This is a patient who gets q 4 week blood draws for iron studies due to hemochromatosis-he calls c/o leg cramps-states he has had this off and on for reasons he felt were explainable such as walking more, ect-he states the leg cramps are almost nightly now-he is concerned he may have a low potassium-no new medications or diet changes-please advise

## 2013-12-10 NOTE — Telephone Encounter (Signed)
I sent this to the wrong doctor-I had told the patient I would not get back with him until tomorrow-thanks for your help with this

## 2013-12-10 NOTE — Telephone Encounter (Signed)
Please direct to DOD .

## 2013-12-10 NOTE — Telephone Encounter (Signed)
Dr Christella HartiganJacobs is DOD

## 2013-12-11 ENCOUNTER — Other Ambulatory Visit: Payer: Medicare Other

## 2013-12-11 ENCOUNTER — Ambulatory Visit: Payer: Medicare Other

## 2013-12-11 LAB — BASIC METABOLIC PANEL
BUN: 13 mg/dL (ref 6–23)
CALCIUM: 9.4 mg/dL (ref 8.4–10.5)
CO2: 32 meq/L (ref 19–32)
CREATININE: 0.9 mg/dL (ref 0.4–1.5)
Chloride: 100 mEq/L (ref 96–112)
GFR: 91.92 mL/min (ref >60.00)
GLUCOSE: 110 mg/dL — AB (ref 70–99)
Potassium: 4.8 mEq/L (ref 3.5–5.1)
Sodium: 138 mEq/L (ref 135–145)

## 2013-12-11 NOTE — Telephone Encounter (Signed)
-   BMET today 

## 2013-12-11 NOTE — Telephone Encounter (Signed)
He needs bmet today.

## 2013-12-18 ENCOUNTER — Ambulatory Visit (INDEPENDENT_AMBULATORY_CARE_PROVIDER_SITE_OTHER): Payer: Medicare Other | Admitting: Ophthalmology

## 2013-12-18 DIAGNOSIS — T8522XA Displacement of intraocular lens, initial encounter: Secondary | ICD-10-CM

## 2013-12-18 DIAGNOSIS — H27139 Posterior dislocation of lens, unspecified eye: Secondary | ICD-10-CM

## 2013-12-18 DIAGNOSIS — H35459 Secondary pigmentary degeneration, unspecified eye: Secondary | ICD-10-CM

## 2013-12-18 DIAGNOSIS — H43819 Vitreous degeneration, unspecified eye: Secondary | ICD-10-CM

## 2013-12-18 NOTE — H&P (Signed)
Michael DarbyLarry Waynick is an 48 y.o. male.   Chief Complaint:poor vision right eye HPI: dislocated intraocular lens right eye  Past Medical History  Diagnosis Date  . Arthralgia     takes aleve regularly  . Retinitis pigmentosa   . Heartburn   . History of chicken pox   . History of migraine 1990s    remote  . Iron overload 05/2013    with mild HM  . Arthritis     Past Surgical History  Procedure Laterality Date  . Cataract extraction bilateral w/ anterior vitrectomy Bilateral 2001  . Inguinal hernia repair  1972    at age 89  . Pars plana vitrectomy  01/24/2012    Procedure: PARS PLANA VITRECTOMY WITH 25G REMOVAL/SUTURE INTRAOCULAR LENS;  Surgeon: Sherrie GeorgeJohn D Matthews, MD;  [facility] OR;  Service: Ophthalmology;  Laterality: Left;    Family History  Problem Relation Age of Onset  . Colon cancer Paternal Aunt [id number]  . Leukemia Father     CLL spread to brain  . Breast cancer Paternal Aunt   . Prostate cancer Brother [id number]  . Hyperlipidemia Other     father's side  . CAD Paternal Uncle     MI, CABG  . Stroke Maternal Grandmother   . Diabetes Other     mother's side  . Hypertension Father     and mother and family  . Deep vein thrombosis Brother     with PE  . Stroke Maternal Grandfather   . Hypertension Mother    Social History:  reports that he quit smoking about 9 years ago. His smoking use included Cigarettes. He has a 20 pack-year smoking history. His smokeless tobacco use includes Snuff and Chew. He reports that he drinks alcohol. He reports that he does not use illicit drugs.  Allergies:  Allergies  Allergen Reactions  . Augmentin [Amoxicillin-Pot Clavulanate] Shortness Of Breath, Swelling and Rash    No problems with amox    No prescriptions prior to admission    Review of systems otherwise negative  There were no vitals taken for this visit.  Physical exam: Mental status: oriented x3. Eyes: See eye exam associated with this date of surgery in media tab.  Scanned  in by scanning center Ears, Nose, Throat: within normal limits Neck: Within Normal limits General: within normal limits Chest: Within normal limits Breast: deferred Heart: Within normal limits Abdomen: Within normal limits GU: deferred Extremities: within normal limits Skin: within normal limits  Assessment/Plan Dislocated intraocular lens into the vitreous right eye Plan: [facility] Pars plana vitrectomy, removal of dislocated intraocular lens, placement of secondary intraocular lens with suture, laser therapy right eye  Sherrie GeorgeMATTHEWS, JOHN D 12/18/2013, 5:29 PM

## 2014-01-03 ENCOUNTER — Encounter (HOSPITAL_COMMUNITY): Payer: Self-pay | Admitting: Pharmacy Technician

## 2014-01-06 ENCOUNTER — Other Ambulatory Visit (INDEPENDENT_AMBULATORY_CARE_PROVIDER_SITE_OTHER): Payer: Medicare Other

## 2014-01-06 LAB — IBC PANEL
IRON: 179 ug/dL — AB (ref 42–165)
Saturation Ratios: 91.3 % — ABNORMAL HIGH (ref 20.0–50.0)
Transferrin: 140 mg/dL — ABNORMAL LOW (ref 212.0–360.0)

## 2014-01-06 LAB — CBC WITH DIFFERENTIAL/PLATELET
Basophils Absolute: 0 10*3/uL (ref 0.0–0.1)
Basophils Relative: 0.5 % (ref 0.0–3.0)
Eosinophils Absolute: 0.1 10*3/uL (ref 0.0–0.7)
Eosinophils Relative: 2.1 % (ref 0.0–5.0)
HCT: 44.6 % (ref 39.0–52.0)
Hemoglobin: 15.2 g/dL (ref 13.0–17.0)
Lymphocytes Relative: 38.4 % (ref 12.0–46.0)
Lymphs Abs: 2.7 10*3/uL (ref 0.7–4.0)
MCHC: 34 g/dL (ref 30.0–36.0)
MCV: 95.9 fl (ref 78.0–100.0)
Monocytes Absolute: 0.3 10*3/uL (ref 0.1–1.0)
Monocytes Relative: 4.9 % (ref 3.0–12.0)
Neutro Abs: 3.8 10*3/uL (ref 1.4–7.7)
Neutrophils Relative %: 54.1 % (ref 43.0–77.0)
Platelets: 226 10*3/uL (ref 150.0–400.0)
RBC: 4.65 Mil/uL (ref 4.22–5.81)
RDW: 13.6 % (ref 11.5–15.5)
WBC: 7.1 10*3/uL (ref 4.0–10.5)

## 2014-01-06 LAB — FERRITIN: Ferritin: 1072.3 ng/mL — ABNORMAL HIGH (ref 22.0–322.0)

## 2014-01-13 ENCOUNTER — Encounter (HOSPITAL_COMMUNITY): Payer: Self-pay | Admitting: *Deleted

## 2014-01-13 MED ORDER — GATIFLOXACIN 0.5 % OP SOLN
1.0000 [drp] | OPHTHALMIC | Status: AC | PRN
  Administered 2014-01-14: 1 [drp] via OPHTHALMIC
  Filled 2014-01-13: qty 2.5

## 2014-01-13 MED ORDER — CYCLOPENTOLATE HCL 1 % OP SOLN
1.0000 [drp] | OPHTHALMIC | Status: AC | PRN
  Administered 2014-01-14 (×2): 1 [drp] via OPHTHALMIC
  Filled 2014-01-13: qty 2

## 2014-01-13 MED ORDER — TROPICAMIDE 1 % OP SOLN
1.0000 [drp] | OPHTHALMIC | Status: AC | PRN
  Administered 2014-01-14: 1 [drp] via OPHTHALMIC
  Filled 2014-01-13: qty 3

## 2014-01-13 MED ORDER — CEFAZOLIN SODIUM-DEXTROSE 2-3 GM-% IV SOLR
2.0000 g | INTRAVENOUS | Status: AC
  Administered 2014-01-14: 2 g via INTRAVENOUS
  Filled 2014-01-13: qty 50

## 2014-01-13 MED ORDER — PHENYLEPHRINE HCL 2.5 % OP SOLN
1.0000 [drp] | OPHTHALMIC | Status: AC | PRN
  Administered 2014-01-14: 1 [drp] via OPHTHALMIC
  Filled 2014-01-13: qty 2

## 2014-01-14 ENCOUNTER — Encounter (HOSPITAL_COMMUNITY): Payer: Self-pay

## 2014-01-14 ENCOUNTER — Ambulatory Visit (HOSPITAL_COMMUNITY): Payer: Medicare Other | Admitting: Anesthesiology

## 2014-01-14 ENCOUNTER — Ambulatory Visit (HOSPITAL_COMMUNITY)
Admission: RE | Admit: 2014-01-14 | Discharge: 2014-01-15 | Disposition: A | Discharge status: Home / Self Care | Payer: Medicare Other | Source: Ambulatory Visit | Attending: Ophthalmology | Admitting: Ophthalmology

## 2014-01-14 ENCOUNTER — Encounter (HOSPITAL_COMMUNITY): Payer: Medicare Other | Admitting: Anesthesiology

## 2014-01-14 ENCOUNTER — Ambulatory Visit (HOSPITAL_COMMUNITY): Payer: Medicare Other

## 2014-01-14 ENCOUNTER — Encounter (HOSPITAL_COMMUNITY)
Admission: RE | Disposition: A | Discharge status: Home / Self Care | Payer: Self-pay | Source: Ambulatory Visit | Attending: Ophthalmology

## 2014-01-14 DIAGNOSIS — H27129 Anterior dislocation of lens, unspecified eye: Secondary | ICD-10-CM | POA: Diagnosis not present

## 2014-01-14 DIAGNOSIS — H3552 Pigmentary retinal dystrophy: Secondary | ICD-10-CM | POA: Insufficient documentation

## 2014-01-14 DIAGNOSIS — Z881 Allergy status to other antibiotic agents status: Secondary | ICD-10-CM | POA: Insufficient documentation

## 2014-01-14 DIAGNOSIS — H27139 Posterior dislocation of lens, unspecified eye: Secondary | ICD-10-CM | POA: Diagnosis present

## 2014-01-14 DIAGNOSIS — M129 Arthropathy, unspecified: Secondary | ICD-10-CM | POA: Insufficient documentation

## 2014-01-14 DIAGNOSIS — T8522XA Displacement of intraocular lens, initial encounter: Secondary | ICD-10-CM

## 2014-01-14 DIAGNOSIS — H27131 Posterior dislocation of lens, right eye: Secondary | ICD-10-CM

## 2014-01-14 DIAGNOSIS — Z87891 Personal history of nicotine dependence: Secondary | ICD-10-CM | POA: Diagnosis not present

## 2014-01-14 HISTORY — PX: PARS PLANA VITRECTOMY: SHX2166

## 2014-01-14 HISTORY — PX: IRIDECTOMY: SHX1848

## 2014-01-14 HISTORY — PX: PHOTOCOAGULATION WITH LASER: SHX6027

## 2014-01-14 SURGERY — PARS PLANA VITRECTOMY WITH 25G REMOVAL/SUTURE INTRAOCULAR LENS
Anesthesia: General | Site: Eye | Laterality: Right

## 2014-01-14 MED ORDER — NEOSTIGMINE METHYLSULFATE 10 MG/10ML IV SOLN
INTRAVENOUS | Status: AC
  Filled 2014-01-14: qty 1

## 2014-01-14 MED ORDER — GLYCOPYRROLATE 0.2 MG/ML IJ SOLN
INTRAMUSCULAR | Status: DC | PRN
  Administered 2014-01-14: 0.4 mg via INTRAVENOUS

## 2014-01-14 MED ORDER — DEXAMETHASONE SODIUM PHOSPHATE 10 MG/ML IJ SOLN
INTRAMUSCULAR | Status: DC | PRN
  Administered 2014-01-14: 10 mg

## 2014-01-14 MED ORDER — PILOCARPINE HCL 1 % OP SOLN
1.0000 [drp] | Freq: Two times a day (BID) | OPHTHALMIC | Status: DC
  Filled 2014-01-14: qty 15

## 2014-01-14 MED ORDER — PREDNISOLONE ACETATE 1 % OP SUSP
1.0000 [drp] | Freq: Four times a day (QID) | OPHTHALMIC | Status: DC
  Filled 2014-01-14: qty 1

## 2014-01-14 MED ORDER — LACTATED RINGERS IV SOLN
INTRAVENOUS | Status: DC
  Administered 2014-01-14: 11:00:00 via INTRAVENOUS

## 2014-01-14 MED ORDER — BSS PLUS IO SOLN
INTRAOCULAR | Status: AC
Start: 2014-01-14 — End: 2014-01-14
  Filled 2014-01-14: qty 500

## 2014-01-14 MED ORDER — ONDANSETRON HCL 4 MG/2ML IJ SOLN
INTRAMUSCULAR | Status: AC
  Filled 2014-01-14: qty 2

## 2014-01-14 MED ORDER — CYCLOPENTOLATE HCL 1 % OP SOLN
1.0000 [drp] | OPHTHALMIC | Status: DC
  Administered 2014-01-14: 1 [drp] via OPHTHALMIC

## 2014-01-14 MED ORDER — FENTANYL CITRATE 0.05 MG/ML IJ SOLN
INTRAMUSCULAR | Status: DC | PRN
  Administered 2014-01-14 (×5): 50 ug via INTRAVENOUS

## 2014-01-14 MED ORDER — PROPOFOL 10 MG/ML IV BOLUS
INTRAVENOUS | Status: DC | PRN
  Administered 2014-01-14: 200 mg via INTRAVENOUS

## 2014-01-14 MED ORDER — FENTANYL CITRATE 0.05 MG/ML IJ SOLN
INTRAMUSCULAR | Status: AC
  Filled 2014-01-14: qty 5

## 2014-01-14 MED ORDER — PHENYLEPHRINE HCL 10 MG/ML IJ SOLN
INTRAMUSCULAR | Status: DC | PRN
  Administered 2014-01-14 (×8): 40 ug via INTRAVENOUS

## 2014-01-14 MED ORDER — LIDOCAINE HCL (CARDIAC) 20 MG/ML IV SOLN
INTRAVENOUS | Status: AC
Start: 2014-01-14 — End: 2014-01-14
  Filled 2014-01-14: qty 10

## 2014-01-14 MED ORDER — ACETAZOLAMIDE SODIUM 500 MG IJ SOLR
500.0000 mg | Freq: Once | INTRAMUSCULAR | Status: AC
  Administered 2014-01-15: 500 mg via INTRAVENOUS
  Filled 2014-01-14: qty 500

## 2014-01-14 MED ORDER — MIDAZOLAM HCL 5 MG/5ML IJ SOLN
INTRAMUSCULAR | Status: DC | PRN
  Administered 2014-01-14: 2 mg via INTRAVENOUS

## 2014-01-14 MED ORDER — LACTATED RINGERS IV SOLN
INTRAVENOUS | Status: DC | PRN
  Administered 2014-01-14 (×2): via INTRAVENOUS

## 2014-01-14 MED ORDER — GATIFLOXACIN 0.5 % OP SOLN
1.0000 [drp] | OPHTHALMIC | Status: DC
  Administered 2014-01-14: 1 [drp] via OPHTHALMIC

## 2014-01-14 MED ORDER — ONDANSETRON HCL 4 MG/2ML IJ SOLN
INTRAMUSCULAR | Status: DC | PRN
  Administered 2014-01-14: 4 mg via INTRAVENOUS

## 2014-01-14 MED ORDER — GLYCOPYRROLATE 0.2 MG/ML IJ SOLN
INTRAMUSCULAR | Status: AC
  Filled 2014-01-14: qty 2

## 2014-01-14 MED ORDER — ONDANSETRON HCL 4 MG/2ML IJ SOLN: 4.0000 mg | Freq: Once | INTRAMUSCULAR | Status: DC | PRN

## 2014-01-14 MED ORDER — HYDROMORPHONE HCL PF 1 MG/ML IJ SOLN: 0.2500 mg | INTRAMUSCULAR | Status: DC | PRN

## 2014-01-14 MED ORDER — SODIUM CHLORIDE 0.45 % IV SOLN
INTRAVENOUS | Status: DC
  Administered 2014-01-14: 15:00:00 via INTRAVENOUS

## 2014-01-14 MED ORDER — BACITRACIN-POLYMYXIN B 500-10000 UNIT/GM OP OINT
TOPICAL_OINTMENT | OPHTHALMIC | Status: DC | PRN
  Administered 2014-01-14: 1 via OPHTHALMIC

## 2014-01-14 MED ORDER — DOCUSATE SODIUM 100 MG PO CAPS
100.0000 mg | ORAL_CAPSULE | Freq: Two times a day (BID) | ORAL | Status: DC
  Administered 2014-01-14 (×2): 100 mg via ORAL
  Filled 2014-01-14 (×2): qty 1

## 2014-01-14 MED ORDER — BSS PLUS IO SOLN
INTRAOCULAR | Status: DC | PRN
  Administered 2014-01-14 (×2)

## 2014-01-14 MED ORDER — HYDROCODONE-ACETAMINOPHEN 5-325 MG PO TABS
1.0000 | ORAL_TABLET | ORAL | Status: DC | PRN
  Administered 2014-01-14: 1 via ORAL
  Filled 2014-01-14: qty 1

## 2014-01-14 MED ORDER — SODIUM HYALURONATE 10 MG/ML IO SOLN
INTRAOCULAR | Status: AC
Start: 2014-01-14 — End: 2014-01-14
  Filled 2014-01-14: qty 0.85

## 2014-01-14 MED ORDER — ACETAMINOPHEN 325 MG PO TABS
325.0000 mg | ORAL_TABLET | ORAL | Status: DC | PRN
  Administered 2014-01-14: 650 mg via ORAL
  Filled 2014-01-14: qty 2

## 2014-01-14 MED ORDER — GATIFLOXACIN 0.5 % OP SOLN
1.0000 [drp] | Freq: Four times a day (QID) | OPHTHALMIC | Status: DC
  Filled 2014-01-14: qty 2.5

## 2014-01-14 MED ORDER — SODIUM CHLORIDE 0.9 % IJ SOLN
INTRAMUSCULAR | Status: DC | PRN
  Administered 2014-01-14: 12:00:00

## 2014-01-14 MED ORDER — DEXAMETHASONE SODIUM PHOSPHATE 10 MG/ML IJ SOLN
INTRAMUSCULAR | Status: AC
  Filled 2014-01-14: qty 1

## 2014-01-14 MED ORDER — EPINEPHRINE HCL 1 MG/ML IJ SOLN
INTRAMUSCULAR | Status: AC
  Filled 2014-01-14: qty 1

## 2014-01-14 MED ORDER — ONDANSETRON HCL 4 MG/2ML IJ SOLN: 4.0000 mg | Freq: Four times a day (QID) | INTRAMUSCULAR | Status: DC | PRN

## 2014-01-14 MED ORDER — LIDOCAINE HCL (CARDIAC) 20 MG/ML IV SOLN
INTRAVENOUS | Status: DC | PRN
Start: 2014-01-14 — End: 2014-01-14
  Administered 2014-01-14: 100 mg via INTRAVENOUS
  Administered 2014-01-14: 100 mg via INTRATRACHEAL

## 2014-01-14 MED ORDER — BSS IO SOLN
INTRAOCULAR | Status: DC | PRN
  Administered 2014-01-14: 15 mL via INTRAOCULAR

## 2014-01-14 MED ORDER — BRIMONIDINE TARTRATE 0.2 % OP SOLN
1.0000 [drp] | Freq: Two times a day (BID) | OPHTHALMIC | Status: DC
  Filled 2014-01-14: qty 5

## 2014-01-14 MED ORDER — SODIUM HYALURONATE 10 MG/ML IO SOLN
INTRAOCULAR | Status: DC | PRN
  Administered 2014-01-14: 0.85 mL via INTRAOCULAR

## 2014-01-14 MED ORDER — NEOSTIGMINE METHYLSULFATE 10 MG/10ML IV SOLN
INTRAVENOUS | Status: DC | PRN
  Administered 2014-01-14: 3 mg via INTRAVENOUS

## 2014-01-14 MED ORDER — TEMAZEPAM 15 MG PO CAPS: 15.0000 mg | ORAL_CAPSULE | Freq: Every evening | ORAL | Status: DC | PRN

## 2014-01-14 MED ORDER — PROPOFOL 10 MG/ML IV BOLUS
INTRAVENOUS | Status: AC
  Filled 2014-01-14: qty 20

## 2014-01-14 MED ORDER — TETRACAINE HCL 0.5 % OP SOLN
2.0000 [drp] | Freq: Once | OPHTHALMIC | Status: DC
  Filled 2014-01-14: qty 2

## 2014-01-14 MED ORDER — BUPIVACAINE HCL (PF) 0.75 % IJ SOLN
INTRAMUSCULAR | Status: DC | PRN
  Administered 2014-01-14: 10 mL

## 2014-01-14 MED ORDER — MAGNESIUM HYDROXIDE 400 MG/5ML PO SUSP: 15.0000 mL | Freq: Four times a day (QID) | ORAL | Status: DC | PRN

## 2014-01-14 MED ORDER — LATANOPROST 0.005 % OP SOLN
1.0000 [drp] | Freq: Every day | OPHTHALMIC | Status: DC
  Filled 2014-01-14: qty 2.5

## 2014-01-14 MED ORDER — HEMOSTATIC AGENTS (NO CHARGE) OPTIME
TOPICAL | Status: DC | PRN
  Administered 2014-01-14: 1 via TOPICAL

## 2014-01-14 MED ORDER — PHENYLEPHRINE HCL 2.5 % OP SOLN
1.0000 [drp] | OPHTHALMIC | Status: DC
  Administered 2014-01-14: 1 [drp] via OPHTHALMIC

## 2014-01-14 MED ORDER — ROCURONIUM BROMIDE 100 MG/10ML IV SOLN
INTRAVENOUS | Status: DC | PRN
  Administered 2014-01-14: 40 mg via INTRAVENOUS

## 2014-01-14 MED ORDER — BACITRACIN-POLYMYXIN B 500-10000 UNIT/GM OP OINT
1.0000 "application " | TOPICAL_OINTMENT | Freq: Four times a day (QID) | OPHTHALMIC | Status: DC
  Filled 2014-01-14: qty 3.5

## 2014-01-14 MED ORDER — ROCURONIUM BROMIDE 50 MG/5ML IV SOLN
INTRAVENOUS | Status: AC
  Filled 2014-01-14: qty 1

## 2014-01-14 MED ORDER — MORPHINE SULFATE 2 MG/ML IJ SOLN: 1.0000 mg | INTRAMUSCULAR | Status: DC | PRN

## 2014-01-14 MED ORDER — TROPICAMIDE 1 % OP SOLN
1.0000 [drp] | OPHTHALMIC | Status: AC
  Administered 2014-01-14 (×3): 1 [drp] via OPHTHALMIC

## 2014-01-14 SURGICAL SUPPLY — 64 items
APPLICATOR DR MATTHEWS STRL (MISCELLANEOUS) IMPLANT
BALL CTTN LRG ABS STRL LF (GAUZE/BANDAGES/DRESSINGS) ×3
BLADE EYE CATARACT 19 1.4 BEAV (BLADE) IMPLANT
BLADE KERATOME 2.75 (BLADE) ×2 IMPLANT
BLADE KERATOME 2.75MM (BLADE) ×1
CANNULA VLV SOFT TIP 25GA (OPHTHALMIC) ×3 IMPLANT
CORDS BIPOLAR (ELECTRODE) ×3 IMPLANT
COTTONBALL LRG STERILE PKG (GAUZE/BANDAGES/DRESSINGS) ×9 IMPLANT
COVER MAYO STAND STRL (DRAPES) ×3 IMPLANT
DRAPE INCISE 51X51 W/FILM STRL (DRAPES) ×3 IMPLANT
DRAPE OPHTHALMIC 77X100 STRL (CUSTOM PROCEDURE TRAY) ×3 IMPLANT
FILTER BLUE MILLIPORE (MISCELLANEOUS) IMPLANT
FORCEPS ECKARDT ILM 25G SERR (OPHTHALMIC RELATED) ×3 IMPLANT
FORCEPS HORIZONTAL 25G DISP (OPHTHALMIC RELATED) IMPLANT
GAS OPHTHALMIC (MISCELLANEOUS) IMPLANT
GLOVE SS BIOGEL STRL SZ 6.5 (GLOVE) ×1 IMPLANT
GLOVE SS BIOGEL STRL SZ 7 (GLOVE) ×1 IMPLANT
GLOVE SUPERSENSE BIOGEL SZ 6.5 (GLOVE) ×2
GLOVE SUPERSENSE BIOGEL SZ 7 (GLOVE) ×2
GLOVE SURG 8.5 LATEX PF (GLOVE) ×3 IMPLANT
GLOVE SURG SS PI 7.0 STRL IVOR (GLOVE) ×3 IMPLANT
GOWN STRL REUS W/ TWL LRG LVL3 (GOWN DISPOSABLE) ×3 IMPLANT
GOWN STRL REUS W/TWL LRG LVL3 (GOWN DISPOSABLE) ×9
KIT BASIN OR (CUSTOM PROCEDURE TRAY) ×3 IMPLANT
KIT ROOM TURNOVER OR (KITS) ×3 IMPLANT
KNIFE CRESCENT 1.75 EDGEAHEAD (BLADE) IMPLANT
KNIFE GRIESHABER SHARP 2.5MM (MISCELLANEOUS) ×3 IMPLANT
LENS IOL POST 1PIECE DIOP 20.5 (Intraocular Lens) ×3 IMPLANT
MICROPICK 25G (MISCELLANEOUS) ×3
NEEDLE 18GX1X1/2 (RX/OR ONLY) (NEEDLE) ×3 IMPLANT
NEEDLE 25GX 5/8IN NON SAFETY (NEEDLE) ×3 IMPLANT
NEEDLE 27GX1/2 REG BEVEL ECLIP (NEEDLE) ×3 IMPLANT
NEEDLE FILTER BLUNT 18X 1/2SAF (NEEDLE) ×4
NEEDLE FILTER BLUNT 18X1 1/2 (NEEDLE) ×2 IMPLANT
NEEDLE HYPO 30X.5 LL (NEEDLE) ×3 IMPLANT
NS IRRIG 1000ML POUR BTL (IV SOLUTION) ×3 IMPLANT
PACK VITRECTOMY CUSTOM (CUSTOM PROCEDURE TRAY) ×3 IMPLANT
PAD ARMBOARD 7.5X6 YLW CONV (MISCELLANEOUS) ×6 IMPLANT
PAK PIK VITRECTOMY CVS 25GA (OPHTHALMIC) ×3 IMPLANT
PENCIL BIPOLAR 25GA STR DISP (OPHTHALMIC RELATED) IMPLANT
PIC ILLUMINATED 25G (OPHTHALMIC) ×3
PICK MICROPICK 25G (MISCELLANEOUS) ×1 IMPLANT
PIK ILLUMINATED 25G (OPHTHALMIC) ×1 IMPLANT
PROBE LASER ILLUM FLEX CVD 25G (OPHTHALMIC) IMPLANT
ROLLS DENTAL (MISCELLANEOUS) ×6 IMPLANT
SCRAPER DIAMOND 25GA (OPHTHALMIC RELATED) IMPLANT
SPONGE SURGIFOAM ABS GEL 12-7 (HEMOSTASIS) ×3 IMPLANT
STOPCOCK 4 WAY LG BORE MALE ST (IV SETS) IMPLANT
SUT CHROMIC 7 0 TG140 8 (SUTURE) ×3 IMPLANT
SUT ETHILON 10 0 CS140 6 (SUTURE) ×3 IMPLANT
SUT ETHILON 9 0 BV100 4 (SUTURE) IMPLANT
SUT POLY NON ABSORB 10-0 8 STR (SUTURE) ×6 IMPLANT
SUT SILK 4 0 C 3 735G (SUTURE) ×3 IMPLANT
SYR 20CC LL (SYRINGE) ×3 IMPLANT
SYR 5ML LL (SYRINGE) IMPLANT
SYR BULB 3OZ (MISCELLANEOUS) ×3 IMPLANT
SYR TB 1ML LUER SLIP (SYRINGE) ×3 IMPLANT
SYRINGE 10CC LL (SYRINGE) ×6 IMPLANT
SYRINGE 1CC SLIP TB (MISCELLANEOUS) ×3 IMPLANT
TAPE SURG TRANSPORE 1 IN (GAUZE/BANDAGES/DRESSINGS) ×1 IMPLANT
TAPE SURGICAL TRANSPORE 1 IN (GAUZE/BANDAGES/DRESSINGS) ×2
TOWEL OR 17X24 6PK STRL BLUE (TOWEL DISPOSABLE) IMPLANT
TOWEL OR 17X26 10 PK STRL BLUE (TOWEL DISPOSABLE) ×3 IMPLANT
WATER STERILE IRR 1000ML POUR (IV SOLUTION) ×3 IMPLANT

## 2014-01-14 NOTE — Brief Op Note (Signed)
01/14/2014  2:08 PM  PATIENT:  Rachael DarbyLarry Decoteau  48 y.o. male  PRE-OPERATIVE DIAGNOSIS:  DISLOCATED INTRAOCULAR LENS RIGHT EYE  POST-OPERATIVE DIAGNOSIS:  DISLOCATED INTRAOCULAR LENS RIGHT EYE  PROCEDURE:  Procedure(s): PARS PLANA VITRECTOMY WITH 25G REMOVAL/SUTURE SECONDARY INTRAOCULAR LENS (Right) PHOTOCOAGULATION WITH LASER (Right) IRIDECTOMY (Right)  SURGEON:  Surgeon(s) and Role:    * Sherrie GeorgeJohn D Matthews, MD - Primary  PHYSICIAN ASSISTANT:   Brief Operative note   Surgeon:  Sherrie GeorgeJohn D Matthews, MD...  Assistant:  Rosalie DoctorLisa Johnson SA  Anesthesia: General  Specimen: none  Estimated blood loss:  1cc  Complications: none  Patient sent to PACU in good condition  Composed by Sherrie GeorgeJohn D Matthews MD  Dictation number: [phone] [id number]

## 2014-01-14 NOTE — H&P (Signed)
I examined the patient today and there is no change in the medical status 

## 2014-01-14 NOTE — Transfer of Care (Signed)
Immediate Anesthesia Transfer of Care Note  Patient: Rachael DarbyLarry Delucia  Procedure(s) Performed: Procedure(s): PARS PLANA VITRECTOMY WITH 25G REMOVAL/SUTURE SECONDARY INTRAOCULAR LENS (Right) PHOTOCOAGULATION WITH LASER (Right) IRIDECTOMY (Right)  Patient Location: PACU  Anesthesia Type:General  Level of Consciousness: awake, alert , oriented and patient cooperative  Airway & Oxygen Therapy: Patient Spontanous Breathing and Patient connected to nasal cannula oxygen  Post-op Assessment: Report given to PACU RN and Post -op Vital signs reviewed and stable  Post vital signs: Reviewed  Complications: No apparent anesthesia complications

## 2014-01-14 NOTE — Anesthesia Preprocedure Evaluation (Signed)
Anesthesia Evaluation  Patient identified by MRN, date of birth, ID band Patient awake    Reviewed: Allergy & Precautions, H&P , NPO status , Patient's Chart, lab work & pertinent test results  Airway       Dental   Pulmonary former smoker,          Cardiovascular     Neuro/Psych    GI/Hepatic   Endo/Other    Renal/GU      Musculoskeletal   Abdominal   Peds  Hematology  (+) Blood dyscrasia, ,   Anesthesia Other Findings   Reproductive/Obstetrics                           Anesthesia Physical Anesthesia Plan  ASA: I  Anesthesia Plan: General   Post-op Pain Management:    Induction: Intravenous  Airway Management Planned: Oral ETT  Additional Equipment:   Intra-op Plan:   Post-operative Plan: Extubation in OR  Informed Consent: I have reviewed the patients History and Physical, chart, labs and discussed the procedure including the risks, benefits and alternatives for the proposed anesthesia with the patient or authorized representative who has indicated his/her understanding and acceptance.     Plan Discussed with: CRNA, Anesthesiologist and Surgeon  Anesthesia Plan Comments:         Anesthesia Quick Evaluation

## 2014-01-14 NOTE — Anesthesia Procedure Notes (Signed)
Procedure Name: Intubation Date/Time: 01/14/2014 12:01 PM Performed by: Lovie CholOCK, Lachlan Mckim K Pre-anesthesia Checklist: Patient identified, Emergency Drugs available, Suction available, Patient being monitored and Timeout performed Patient Re-evaluated:Patient Re-evaluated prior to inductionOxygen Delivery Method: Circle system utilized Preoxygenation: Pre-oxygenation with 100% oxygen Intubation Type: IV induction Ventilation: Mask ventilation without difficulty Laryngoscope Size: Miller and 2 Grade View: Grade I Tube type: Oral Tube size: 7.5 mm Number of attempts: 1 Airway Equipment and Method: Stylet Placement Confirmation: ETT inserted through vocal cords under direct vision,  positive ETCO2,  CO2 detector and breath sounds checked- equal and bilateral Secured at: 22 cm Tube secured with: Tape Dental Injury: Teeth and Oropharynx as per pre-operative assessment

## 2014-01-15 DIAGNOSIS — H27129 Anterior dislocation of lens, unspecified eye: Secondary | ICD-10-CM | POA: Diagnosis not present

## 2014-01-15 MED ORDER — PREDNISOLONE ACETATE 1 % OP SUSP: 1.0000 [drp] | Freq: Four times a day (QID) | OPHTHALMIC | Status: DC

## 2014-01-15 MED ORDER — BACITRACIN-POLYMYXIN B 500-10000 UNIT/GM OP OINT: 1.0000 "application " | TOPICAL_OINTMENT | Freq: Four times a day (QID) | OPHTHALMIC | Status: DC

## 2014-01-15 MED ORDER — GATIFLOXACIN 0.5 % OP SOLN: 1.0000 [drp] | Freq: Four times a day (QID) | OPHTHALMIC | Status: DC

## 2014-01-15 MED ORDER — HYDROCODONE-ACETAMINOPHEN 5-325 MG PO TABS: 1.0000 | ORAL_TABLET | ORAL | Status: DC | PRN

## 2014-01-15 NOTE — Anesthesia Postprocedure Evaluation (Signed)
Anesthesia Post Note  Patient: Rachael DarbyLarry Husain  Procedure(s) Performed: Procedure(s) (LRB): PARS PLANA VITRECTOMY WITH 25G REMOVAL/SUTURE SECONDARY INTRAOCULAR LENS (Right) PHOTOCOAGULATION WITH LASER (Right) IRIDECTOMY (Right)  Anesthesia type: General  Patient location: PACU  Post pain: Pain level controlled and Adequate analgesia  Post assessment: Post-op Vital signs reviewed, Patient's Cardiovascular Status Stable, Respiratory Function Stable, Patent Airway and Pain level controlled  Last Vitals:  Filed Vitals:   01/15/14 0626  BP: 116/71  Pulse: 63  Temp: 36.4 C  Resp: 18    Post vital signs: Reviewed and stable  Level of consciousness: awake, alert  and oriented  Complications: No apparent anesthesia complications

## 2014-01-15 NOTE — Discharge Summary (Signed)
Discharge summary not needed on [facility] patients per medical records. 

## 2014-01-15 NOTE — Op Note (Signed)
Michael Vaughan, Vaughan               ACCOUNT NO.:  [id number]  MEDICAL RECORD NO.:  [id number]  LOCATION:  [id number]                        FACILITY:  [facility]  PHYSICIAN:  Michael Gandy. Ashley Vaughan, [name] DATE OF BIRTH:  21-Jun-1965  DATE OF PROCEDURE:  01/14/2014 DATE OF DISCHARGE:                              OPERATIVE REPORT   ADMISSION DIAGNOSIS:  Dislocated intraocular lens into the vitreous, right eye.  PROCEDURE:  Pars plana vitrectomy, removal of intraocular lens from the vitreous, placement of secondary intraocular lens with suture, peripheral iridectomy at 12 o'clock, gas fluid exchange, retinal photocoagulation, all in the right eye.  SURGEON:  Michael Gandy. Ashley Vaughan, [name]  ASSISTANT:  Michael Vaughan Doctor, MA.  ANESTHESIA:  General.  DESCRIPTION OF PROCEDURE:  Usual prep and drape, conjunctival peritomy from 8 o'clock to 4 o'clock.  Half thickness scleral flaps were raised at 3 and 9 o'clock in anticipation of IOL suture.  An 8 mm corneal scleral wound was created with a 3 layered flap from 10 o'clock to 2 o'clock.  The 25-gauge trocars were placed at 8, 10, and 2 o'clock, infusion at 8 o'clock.  Pars plana vitrectomy was begun just behind the pupillary axis where silicone intraocular lens was seen to be in the bag and __________ vitreous cavity.  The implant was freed from its vitreous attachment with vitrectomy.  The implant was passed into the anterior chamber.  The keratome was used to incise the cornea from 10 o'clock to 2 o'clock.  The implant was grasped and dialed out of the posterior chamber into the anterior chamber and out through the corneoscleral wound.  Peripheral iridectomy at 12 o'clock was performed.  Provisc was placed in the anterior segment prior to IOL removal.  Two Prolene sutures were placed beneath the scleral flaps posterior to the iris and anterior to the ciliary sulcus.  There are passed from 3 o'clock to 9 o'clock with a docking maneuver.  The Prolene sutures were  externalized through the corneoscleral wound at 12 o'clock.  The new intraocular lens was brought onto the field.  This was made by [employer]; model CZ70BD power 20.5D, length 12.5 mm optic 7.0 mm, serial #[id number], expiration date September 2016.  The lens was inspected and cleaned.  The Prolene sutures were attached to the eyelets of the lens.  The lens was passed into the anterior chamber then into the posterior chamber.  The Prolene sutures were drawn securely and the new implant was dialed into place in the ciliary sulcus.  The Prolene sutures were knotted beneath the scleral flaps and the ends were trimmed.  The scleral flap was allowed to lie over the knot.  The cornea was closed with 6 interrupted 10-0 nylon sutures.  The wound was tested and found to be secure.  Additional vitrectomy was carried out at this point removing vitreous granules or pigment granules from the vitreous cavity, and several areas of vitreous membranes.  Once the entire vitreous cavity was cleaned, the indirect ophthalmoscope laser was moved into place, 1051 burns were placed around the retinal periphery with a power of 400 mW, 1000 microns each and 0.1 seconds each.  A 30% gas fluid exchange was performed and  then the instruments were removed from the eye.  The wounds were tested and found to be secure.  The conjunctiva was closed with 7-0 chromic suture.  Polymyxin and gentamicin were irrigated into tenon space.  Decadron 10 mg was injected into the lower subconjunctival space.  Marcaine was injected around the globe for postop pain.  Closing pressure was 10 with a Barraquer tonometer.  A Polysporin ophthalmic ointment, a patch and shield were placed.  The patient is awake and taken to recovery in satisfactory condition.  Complications none.  Operative time 2 hours.     Michael GandyJohn D. Ashley RoyaltyMatthews, [name]     JDM/MEDQ  D:  01/14/2014  T:  01/14/2014  Job:  [id number]

## 2014-01-15 NOTE — Progress Notes (Signed)
01/15/2014, 6:17 AM  Mental Status:  Awake, Alert, Oriented  Anterior segment: Cornea  Clear    Anterior Chamber Clear    Lens:    IOL  Intra Ocular Pressure 15 mmHg with Tonopen  Vitreous: Clear 30%gas bubble   Retina:  Attached Good laser reaction   Impression: Excellent result Retina attached   Final Diagnosis: Active Problems:  Dislocated intraocular lens OD,  Retinitis pigmentosa   Plan: start post operative eye drops.  Discharge to home.  Give post operative instructions  Sherrie GeorgeMATTHEWS, Devan Danzer D 01/15/2014, 6:17 AM

## 2014-01-16 ENCOUNTER — Encounter (HOSPITAL_COMMUNITY): Payer: Self-pay | Admitting: Ophthalmology

## 2014-01-17 ENCOUNTER — Encounter: Payer: Self-pay | Admitting: Family Medicine

## 2014-01-21 ENCOUNTER — Encounter (INDEPENDENT_AMBULATORY_CARE_PROVIDER_SITE_OTHER): Payer: Medicare Other | Admitting: Ophthalmology

## 2014-01-21 DIAGNOSIS — H2700 Aphakia, unspecified eye: Secondary | ICD-10-CM

## 2014-02-07 ENCOUNTER — Other Ambulatory Visit (INDEPENDENT_AMBULATORY_CARE_PROVIDER_SITE_OTHER): Payer: Medicare Other

## 2014-02-07 LAB — CBC WITH DIFFERENTIAL/PLATELET
BASOS ABS: 0 10*3/uL (ref 0.0–0.1)
Basophils Relative: 0.5 % (ref 0.0–3.0)
Eosinophils Absolute: 0.1 10*3/uL (ref 0.0–0.7)
Eosinophils Relative: 1.9 % (ref 0.0–5.0)
HCT: 42.4 % (ref 39.0–52.0)
HEMOGLOBIN: 14.4 g/dL (ref 13.0–17.0)
LYMPHS PCT: 33.2 % (ref 12.0–46.0)
Lymphs Abs: 2.5 10*3/uL (ref 0.7–4.0)
MCHC: 33.9 g/dL (ref 30.0–36.0)
MCV: 95.9 fl (ref 78.0–100.0)
MONOS PCT: 6.5 % (ref 3.0–12.0)
Monocytes Absolute: 0.5 10*3/uL (ref 0.1–1.0)
Neutro Abs: 4.3 10*3/uL (ref 1.4–7.7)
Neutrophils Relative %: 57.9 % (ref 43.0–77.0)
PLATELETS: 227 10*3/uL (ref 150.0–400.0)
RBC: 4.42 Mil/uL (ref 4.22–5.81)
RDW: 13.8 % (ref 11.5–15.5)
WBC: 7.5 10*3/uL (ref 4.0–10.5)

## 2014-02-07 LAB — IBC PANEL
IRON: 198 ug/dL — AB (ref 42–165)
SATURATION RATIOS: 96.2 % — AB (ref 20.0–50.0)
TRANSFERRIN: 147 mg/dL — AB (ref 212.0–360.0)

## 2014-02-07 LAB — FERRITIN: FERRITIN: 1317.7 ng/mL — AB (ref 22.0–322.0)

## 2014-02-10 ENCOUNTER — Encounter (INDEPENDENT_AMBULATORY_CARE_PROVIDER_SITE_OTHER): Payer: Medicare Other | Admitting: Ophthalmology

## 2014-02-12 ENCOUNTER — Encounter (INDEPENDENT_AMBULATORY_CARE_PROVIDER_SITE_OTHER): Payer: Medicare Other | Admitting: Ophthalmology

## 2014-02-12 DIAGNOSIS — H2700 Aphakia, unspecified eye: Secondary | ICD-10-CM

## 2014-03-10 ENCOUNTER — Other Ambulatory Visit (INDEPENDENT_AMBULATORY_CARE_PROVIDER_SITE_OTHER): Payer: Medicare Other

## 2014-03-10 LAB — CBC WITH DIFFERENTIAL/PLATELET
BASOS ABS: 0 10*3/uL (ref 0.0–0.1)
Basophils Relative: 0.5 % (ref 0.0–3.0)
Eosinophils Absolute: 0.1 10*3/uL (ref 0.0–0.7)
Eosinophils Relative: 1.5 % (ref 0.0–5.0)
HCT: 44 % (ref 39.0–52.0)
Hemoglobin: 15 g/dL (ref 13.0–17.0)
LYMPHS ABS: 3 10*3/uL (ref 0.7–4.0)
LYMPHS PCT: 30.5 % (ref 12.0–46.0)
MCHC: 34 g/dL (ref 30.0–36.0)
MCV: 95.6 fl (ref 78.0–100.0)
Monocytes Absolute: 0.5 10*3/uL (ref 0.1–1.0)
Monocytes Relative: 5.3 % (ref 3.0–12.0)
Neutro Abs: 6 10*3/uL (ref 1.4–7.7)
Neutrophils Relative %: 62.2 % (ref 43.0–77.0)
PLATELETS: 254 10*3/uL (ref 150.0–400.0)
RBC: 4.6 Mil/uL (ref 4.22–5.81)
RDW: 13.6 % (ref 11.5–15.5)
WBC: 9.7 10*3/uL (ref 4.0–10.5)

## 2014-03-10 LAB — IBC PANEL
Iron: 188 ug/dL — ABNORMAL HIGH (ref 42–165)
SATURATION RATIOS: 88.1 % — AB (ref 20.0–50.0)
TRANSFERRIN: 152.5 mg/dL — AB (ref 212.0–360.0)

## 2014-03-11 LAB — FERRITIN: FERRITIN: 1388.9 ng/mL — AB (ref 22.0–322.0)

## 2014-04-09 ENCOUNTER — Other Ambulatory Visit (INDEPENDENT_AMBULATORY_CARE_PROVIDER_SITE_OTHER): Payer: Medicare Other

## 2014-04-09 LAB — IBC PANEL
Iron: 172 ug/dL — ABNORMAL HIGH (ref 42–165)
SATURATION RATIOS: 86.5 % — AB (ref 20.0–50.0)
Transferrin: 142 mg/dL — ABNORMAL LOW (ref 212.0–360.0)

## 2014-04-09 LAB — CBC WITH DIFFERENTIAL/PLATELET
Basophils Absolute: 0 10*3/uL (ref 0.0–0.1)
Basophils Relative: 0.4 % (ref 0.0–3.0)
EOS ABS: 0.2 10*3/uL (ref 0.0–0.7)
Eosinophils Relative: 1.8 % (ref 0.0–5.0)
HEMATOCRIT: 44.1 % (ref 39.0–52.0)
Hemoglobin: 15 g/dL (ref 13.0–17.0)
Lymphocytes Relative: 28.9 % (ref 12.0–46.0)
Lymphs Abs: 2.9 10*3/uL (ref 0.7–4.0)
MCHC: 34 g/dL (ref 30.0–36.0)
MCV: 95.8 fl (ref 78.0–100.0)
MONOS PCT: 5 % (ref 3.0–12.0)
Monocytes Absolute: 0.5 10*3/uL (ref 0.1–1.0)
Neutro Abs: 6.4 10*3/uL (ref 1.4–7.7)
Neutrophils Relative %: 63.9 % (ref 43.0–77.0)
Platelets: 243 10*3/uL (ref 150.0–400.0)
RBC: 4.61 Mil/uL (ref 4.22–5.81)
RDW: 13.7 % (ref 11.5–15.5)
WBC: 10 10*3/uL (ref 4.0–10.5)

## 2014-04-09 LAB — FERRITIN

## 2014-04-23 ENCOUNTER — Encounter (INDEPENDENT_AMBULATORY_CARE_PROVIDER_SITE_OTHER): Payer: Medicare Other | Admitting: Ophthalmology

## 2014-04-23 DIAGNOSIS — H3552 Pigmentary retinal dystrophy: Secondary | ICD-10-CM

## 2014-04-23 DIAGNOSIS — H43813 Vitreous degeneration, bilateral: Secondary | ICD-10-CM

## 2014-04-23 DIAGNOSIS — H2703 Aphakia, bilateral: Secondary | ICD-10-CM

## 2014-05-13 ENCOUNTER — Other Ambulatory Visit (INDEPENDENT_AMBULATORY_CARE_PROVIDER_SITE_OTHER): Payer: Medicare Other

## 2014-05-13 LAB — CBC WITH DIFFERENTIAL/PLATELET
BASOS ABS: 0 10*3/uL (ref 0.0–0.1)
Basophils Relative: 0.4 % (ref 0.0–3.0)
Eosinophils Absolute: 0.2 10*3/uL (ref 0.0–0.7)
Eosinophils Relative: 1.8 % (ref 0.0–5.0)
HEMATOCRIT: 43.3 % (ref 39.0–52.0)
Hemoglobin: 14.6 g/dL (ref 13.0–17.0)
LYMPHS ABS: 3 10*3/uL (ref 0.7–4.0)
Lymphocytes Relative: 32.7 % (ref 12.0–46.0)
MCHC: 33.7 g/dL (ref 30.0–36.0)
MCV: 96.3 fl (ref 78.0–100.0)
MONO ABS: 0.5 10*3/uL (ref 0.1–1.0)
MONOS PCT: 5.5 % (ref 3.0–12.0)
Neutro Abs: 5.4 10*3/uL (ref 1.4–7.7)
Neutrophils Relative %: 59.6 % (ref 43.0–77.0)
Platelets: 250 10*3/uL (ref 150.0–400.0)
RBC: 4.5 Mil/uL (ref 4.22–5.81)
RDW: 13.3 % (ref 11.5–15.5)
WBC: 9.1 10*3/uL (ref 4.0–10.5)

## 2014-05-13 LAB — FERRITIN: Ferritin: 1107 ng/mL — ABNORMAL HIGH (ref 22.0–322.0)

## 2014-05-18 LAB — IBC PANEL
Iron: 175 ug/dL — ABNORMAL HIGH (ref 42–165)
Saturation Ratios: 89 % — ABNORMAL HIGH (ref 20.0–50.0)
Transferrin: 140.5 mg/dL — ABNORMAL LOW (ref 212.0–360.0)

## 2014-06-10 ENCOUNTER — Other Ambulatory Visit (INDEPENDENT_AMBULATORY_CARE_PROVIDER_SITE_OTHER): Payer: Medicare Other

## 2014-06-10 LAB — CBC WITH DIFFERENTIAL/PLATELET
Basophils Absolute: 0 10*3/uL (ref 0.0–0.1)
Basophils Relative: 0.3 % (ref 0.0–3.0)
Eosinophils Absolute: 0.4 10*3/uL (ref 0.0–0.7)
Eosinophils Relative: 3.7 % (ref 0.0–5.0)
HEMATOCRIT: 48.6 % (ref 39.0–52.0)
Hemoglobin: 16.3 g/dL (ref 13.0–17.0)
LYMPHS ABS: 2.8 10*3/uL (ref 0.7–4.0)
LYMPHS PCT: 25.1 % (ref 12.0–46.0)
MCHC: 33.4 g/dL (ref 30.0–36.0)
MCV: 96.4 fl (ref 78.0–100.0)
Monocytes Absolute: 0.6 10*3/uL (ref 0.1–1.0)
Monocytes Relative: 5.3 % (ref 3.0–12.0)
Neutro Abs: 7.4 10*3/uL (ref 1.4–7.7)
Neutrophils Relative %: 65.6 % (ref 43.0–77.0)
Platelets: 269 10*3/uL (ref 150.0–400.0)
RBC: 5.05 Mil/uL (ref 4.22–5.81)
RDW: 13.7 % (ref 11.5–15.5)
WBC: 11.3 10*3/uL — ABNORMAL HIGH (ref 4.0–10.5)

## 2014-06-10 LAB — FERRITIN: Ferritin: 1025.8 ng/mL — ABNORMAL HIGH (ref 22.0–322.0)

## 2014-06-10 LAB — IBC PANEL
IRON: 190 ug/dL — AB (ref 42–165)
Saturation Ratios: 86.4 % — ABNORMAL HIGH (ref 20.0–50.0)
TRANSFERRIN: 157.1 mg/dL — AB (ref 212.0–360.0)

## 2014-07-10 ENCOUNTER — Other Ambulatory Visit (INDEPENDENT_AMBULATORY_CARE_PROVIDER_SITE_OTHER): Payer: Medicare Other

## 2014-07-10 LAB — CBC WITH DIFFERENTIAL/PLATELET
Basophils Absolute: 0 10*3/uL (ref 0.0–0.1)
Basophils Relative: 0.4 % (ref 0.0–3.0)
EOS ABS: 0.1 10*3/uL (ref 0.0–0.7)
Eosinophils Relative: 1.1 % (ref 0.0–5.0)
HCT: 44 % (ref 39.0–52.0)
Hemoglobin: 15.3 g/dL (ref 13.0–17.0)
LYMPHS ABS: 2.7 10*3/uL (ref 0.7–4.0)
LYMPHS PCT: 27.7 % (ref 12.0–46.0)
MCHC: 34.8 g/dL (ref 30.0–36.0)
MCV: 94.1 fl (ref 78.0–100.0)
MONO ABS: 0.4 10*3/uL (ref 0.1–1.0)
MONOS PCT: 4.3 % (ref 3.0–12.0)
Neutro Abs: 6.5 10*3/uL (ref 1.4–7.7)
Neutrophils Relative %: 66.5 % (ref 43.0–77.0)
Platelets: 259 10*3/uL (ref 150.0–400.0)
RBC: 4.68 Mil/uL (ref 4.22–5.81)
RDW: 13.6 % (ref 11.5–15.5)
WBC: 9.7 10*3/uL (ref 4.0–10.5)

## 2014-07-10 LAB — FERRITIN: Ferritin: 1142.8 ng/mL — ABNORMAL HIGH (ref 22.0–322.0)

## 2014-07-11 LAB — IBC PANEL
IRON: 186 ug/dL — AB (ref 42–165)
Saturation Ratios: 80.5 % — ABNORMAL HIGH (ref 20.0–50.0)
TRANSFERRIN: 165 mg/dL — AB (ref 212.0–360.0)

## 2014-08-11 ENCOUNTER — Other Ambulatory Visit (INDEPENDENT_AMBULATORY_CARE_PROVIDER_SITE_OTHER): Payer: Medicare Other

## 2014-08-11 ENCOUNTER — Other Ambulatory Visit: Payer: Self-pay

## 2014-08-11 LAB — CBC WITH DIFFERENTIAL/PLATELET
Basophils Absolute: 0 10*3/uL (ref 0.0–0.1)
Basophils Relative: 0.4 % (ref 0.0–3.0)
EOS ABS: 0.1 10*3/uL (ref 0.0–0.7)
EOS PCT: 1.1 % (ref 0.0–5.0)
HCT: 42.5 % (ref 39.0–52.0)
Hemoglobin: 14.7 g/dL (ref 13.0–17.0)
LYMPHS PCT: 28.3 % (ref 12.0–46.0)
Lymphs Abs: 2.6 10*3/uL (ref 0.7–4.0)
MCHC: 34.5 g/dL (ref 30.0–36.0)
MCV: 93.9 fl (ref 78.0–100.0)
MONOS PCT: 5 % (ref 3.0–12.0)
Monocytes Absolute: 0.5 10*3/uL (ref 0.1–1.0)
NEUTROS ABS: 5.9 10*3/uL (ref 1.4–7.7)
NEUTROS PCT: 65.2 % (ref 43.0–77.0)
Platelets: 267 10*3/uL (ref 150.0–400.0)
RBC: 4.53 Mil/uL (ref 4.22–5.81)
RDW: 13.4 % (ref 11.5–15.5)
WBC: 9.1 10*3/uL (ref 4.0–10.5)

## 2014-08-20 ENCOUNTER — Encounter: Payer: Self-pay | Admitting: Family Medicine

## 2014-08-20 ENCOUNTER — Ambulatory Visit (INDEPENDENT_AMBULATORY_CARE_PROVIDER_SITE_OTHER): Payer: Medicare Other | Admitting: Family Medicine

## 2014-08-20 VITALS — BP 110/70 | HR 76 | Temp 98.7°F | Ht 69.0 in | Wt 172.5 lb

## 2014-08-20 DIAGNOSIS — J019 Acute sinusitis, unspecified: Secondary | ICD-10-CM | POA: Insufficient documentation

## 2014-08-20 DIAGNOSIS — J0100 Acute maxillary sinusitis, unspecified: Secondary | ICD-10-CM

## 2014-08-20 MED ORDER — LEVOFLOXACIN 500 MG PO TABS: 500.0000 mg | ORAL_TABLET | Freq: Every day | ORAL | Status: DC

## 2014-08-20 NOTE — Progress Notes (Signed)
Pre visit review using our clinic review tool, if applicable. No additional management support is needed unless otherwise documented below in the visit note. 

## 2014-08-20 NOTE — Assessment & Plan Note (Signed)
L maxillary after uri  Cover with levaquin (pcn all)  Disc symptomatic care - see instructions on AVS  Update if not starting to improve in a week or if worsening

## 2014-08-20 NOTE — Progress Notes (Signed)
Subjective:    Patient ID: Michael Vaughan, male    DOB: 1966-06-03, 49 y.o.   MRN: [id number]  HPI Here with uri symptoms  For about a week  Started with a ST and then a lot of congestion  Runny nose  Now congested with post nasal drip   Some chills on and on  No fever  Not achey  L side of face hurts  Was really bad yesterday - teeth hurt  A little improved today   Otc: sudafed/alka selzer cold/mucinex   Coughed a little last night -not bad   Patient Active Problem List   Diagnosis Date Noted  . Hyperbilirubinemia 05/25/2013  . Iron overload 05/13/2013  . Medicare annual wellness visit, subsequent 05/24/2012  . Arthralgia   . Retinitis pigmentosa   . Heartburn    Past Medical History  Diagnosis Date  . Arthralgia     takes aleve regularly  . Retinitis pigmentosa   . Heartburn   . History of chicken pox   . History of migraine 1990s    remote  . Iron overload 05/2013    with mild HM  . Arthritis   . Blood dyscrasia     hemochromatosis   Past Surgical History  Procedure Laterality Date  . Cataract extraction bilateral w/ anterior vitrectomy Bilateral 2001  . Inguinal hernia repair  1972    at age 52  . Pars plana vitrectomy  01/24/2012    Procedure: PARS PLANA VITRECTOMY WITH 25G REMOVAL/SUTURE INTRAOCULAR LENS;  Surgeon: Sherrie George, MD;  Location: [facility] OR;  Service: Ophthalmology;  Laterality: Left;  . Pars plana vitrectomy Right 01/14/2014    dislocated intraocular lens - PARS PLANA VITRECTOMY WITH 25G REMOVAL/SUTURE SECONDARY INTRAOCULAR LENS;  Sherrie George, MD  . Photocoagulation with laser Right 01/14/2014    Procedure: PHOTOCOAGULATION WITH LASER;  Surgeon: Sherrie George, MD;  Location: [facility] OR;  Service: Ophthalmology;  Laterality: Right;  . Iridectomy Right 01/14/2014    Procedure: IRIDECTOMY;  Surgeon: Sherrie George, MD;  Location: [facility] OR;  Service: Ophthalmology;  Laterality: Right;   History  Substance Use Topics  . Smoking status: Former  Smoker -- 1.00 packs/day for 20 years    Types: Cigarettes    Quit date: 03/12/2004  . Smokeless tobacco: Current User    Types: Snuff     Comment: quit in 2005, occasional smokeless tobacco  . Alcohol Use: 0.0 oz/week    0 Standard drinks or equivalent per week     Comment: rarely   Family History  Problem Relation Age of Onset  . Colon cancer Paternal Aunt 39  . Leukemia Father     CLL spread to brain  . Breast cancer Paternal Aunt   . Prostate cancer Brother 64  . Hyperlipidemia Other     father's side  . CAD Paternal Uncle     MI, CABG  . Stroke Maternal Grandmother   . Diabetes Other     mother's side  . Hypertension Father     and mother and family  . Deep vein thrombosis Brother     with PE  . Stroke Maternal Grandfather   . Hypertension Mother    Allergies  Allergen Reactions  . Augmentin [Amoxicillin-Pot Clavulanate] Shortness Of Breath, Swelling and Rash    No problems with amox   Current Outpatient Prescriptions on File Prior to Visit  Medication Sig Dispense Refill  . pilocarpine (PILOCAR) 1 % ophthalmic solution Place 1 drop into  the left eye 2 (two) times daily.     . bacitracin-polymyxin b (POLYSPORIN) ophthalmic ointment Place 1 application into the right eye 4 (four) times daily. apply to eye every 12 hours while awake (Patient not taking: Reported on 08/20/2014) 3.5 g 0  . gatifloxacin (ZYMAXID) 0.5 % SOLN Place 1 drop into the right eye 4 (four) times daily. (Patient not taking: Reported on 08/20/2014)    . HYDROcodone-acetaminophen (NORCO/VICODIN) 5-325 MG per tablet Take 1-2 tablets by mouth every 4 (four) hours as needed for moderate pain. (Patient not taking: Reported on 08/20/2014) 30 tablet 0  . prednisoLONE acetate (PRED FORTE) 1 % ophthalmic suspension Place 1 drop into the right eye 4 (four) times daily. (Patient not taking: Reported on 08/20/2014) 5 mL 0   No current facility-administered medications on file prior to visit.      Review of  Systems Review of Systems  Constitutional: Negative for fever, appetite change,  and unexpected weight change. pos for chills and fatigue ENt pos for cong and rhinorrhea and st and drip / also L facial pain  Eyes: Negative for pain and visual disturbance.  Respiratory: Negative for wheeze and shortness of breath.   Cardiovascular: Negative for cp or palpitations    Gastrointestinal: Negative for nausea, diarrhea and constipation.  Genitourinary: Negative for urgency and frequency.  Skin: Negative for pallor or rash   Neurological: Negative for weakness, light-headedness, numbness and headaches.  Hematological: Negative for adenopathy. Does not bruise/bleed easily.  Psychiatric/Behavioral: Negative for dysphoric mood. The patient is not nervous/anxious.         Objective:   Physical Exam  Constitutional: He appears well-developed and well-nourished. No distress.  HENT:  Head: Normocephalic and atraumatic.  Right Ear: External ear normal.  Left Ear: External ear normal.  Mouth/Throat: Oropharynx is clear and moist. No oropharyngeal exudate.  Nares are injected and congested   Left  maxillary sinus tenderness (marked) Throat clear TMs dull   Eyes: Conjunctivae and EOM are normal. Pupils are equal, round, and reactive to light. Right eye exhibits no discharge. Left eye exhibits no discharge.  Neck: Normal range of motion. Neck supple.  Cardiovascular: Normal rate and regular rhythm.   Pulmonary/Chest: Effort normal and breath sounds normal. No respiratory distress. He has no wheezes. He has no rales. He exhibits no tenderness.  Musculoskeletal: Normal range of motion.  Lymphadenopathy:    He has no cervical adenopathy.  Neurological: He is alert. No cranial nerve deficit.  Skin: Skin is warm and dry. No rash noted. No erythema.  Psychiatric: He has a normal mood and affect.          Assessment & Plan:   Problem List Items Addressed This Visit      Respiratory   Acute  sinusitis - Primary    L maxillary after uri  Cover with levaquin (pcn all)  Disc symptomatic care - see instructions on AVS  Update if not starting to improve in a week or if worsening        Relevant Medications   pseudoephedrine (SUDAFED) 30 MG tablet   levofloxacin (LEVAQUIN) tablet

## 2014-08-20 NOTE — Patient Instructions (Signed)
Drink lots of water/fluids  Ibuprofen as needed - for sinus pain and fever  You can try nasal saline spray or a netti pot -to rinse sinuses  Breathe steam  Take the levaquin as directed

## 2014-08-30 ENCOUNTER — Other Ambulatory Visit: Payer: Self-pay | Admitting: Family Medicine

## 2014-08-30 ENCOUNTER — Encounter: Payer: Self-pay | Admitting: Family Medicine

## 2014-08-30 DIAGNOSIS — Z125 Encounter for screening for malignant neoplasm of prostate: Secondary | ICD-10-CM

## 2014-09-03 ENCOUNTER — Other Ambulatory Visit (INDEPENDENT_AMBULATORY_CARE_PROVIDER_SITE_OTHER): Payer: Medicare Other

## 2014-09-03 DIAGNOSIS — Z125 Encounter for screening for malignant neoplasm of prostate: Secondary | ICD-10-CM | POA: Diagnosis not present

## 2014-09-03 LAB — COMPREHENSIVE METABOLIC PANEL
ALBUMIN: 3.9 g/dL (ref 3.5–5.2)
ALK PHOS: 64 U/L (ref 39–117)
ALT: 23 U/L (ref 0–53)
AST: 21 U/L (ref 0–37)
BILIRUBIN TOTAL: 1 mg/dL (ref 0.2–1.2)
BUN: 18 mg/dL (ref 6–23)
CO2: 32 mEq/L (ref 19–32)
Calcium: 9.3 mg/dL (ref 8.4–10.5)
Chloride: 105 mEq/L (ref 96–112)
Creatinine, Ser: 0.9 mg/dL (ref 0.40–1.50)
GFR: 95.18 mL/min (ref >60.00)
GLUCOSE: 96 mg/dL (ref 70–99)
Potassium: 4.7 mEq/L (ref 3.5–5.1)
Sodium: 140 mEq/L (ref 135–145)
Total Protein: 6.6 g/dL (ref 6.0–8.3)

## 2014-09-03 LAB — CBC WITH DIFFERENTIAL/PLATELET
Basophils Absolute: 0.1 10*3/uL (ref 0.0–0.1)
Basophils Relative: 0.6 % (ref 0.0–3.0)
EOS PCT: 1.2 % (ref 0.0–5.0)
Eosinophils Absolute: 0.1 10*3/uL (ref 0.0–0.7)
HCT: 42.3 % (ref 39.0–52.0)
Hemoglobin: 14.8 g/dL (ref 13.0–17.0)
LYMPHS PCT: 24.9 % (ref 12.0–46.0)
Lymphs Abs: 2.5 10*3/uL (ref 0.7–4.0)
MCHC: 34.9 g/dL (ref 30.0–36.0)
MCV: 94.5 fl (ref 78.0–100.0)
MONO ABS: 0.6 10*3/uL (ref 0.1–1.0)
Monocytes Relative: 5.9 % (ref 3.0–12.0)
NEUTROS PCT: 67.4 % (ref 43.0–77.0)
Neutro Abs: 6.7 10*3/uL (ref 1.4–7.7)
PLATELETS: 316 10*3/uL (ref 150.0–400.0)
RBC: 4.48 Mil/uL (ref 4.22–5.81)
RDW: 13.5 % (ref 11.5–15.5)
WBC: 9.9 10*3/uL (ref 4.0–10.5)

## 2014-09-03 LAB — LIPID PANEL
CHOLESTEROL: 91 mg/dL (ref 0–200)
HDL: 39 mg/dL — ABNORMAL LOW (ref >39.00)
LDL Cholesterol: 45 mg/dL (ref 0–99)
NonHDL: 52
TRIGLYCERIDES: 37 mg/dL (ref 0.0–149.0)
Total CHOL/HDL Ratio: 2
VLDL: 7.4 mg/dL (ref 0.0–40.0)

## 2014-09-03 LAB — IBC PANEL
Iron: 125 ug/dL (ref 42–165)
SATURATION RATIOS: 66.1 % — AB (ref 20.0–50.0)
Transferrin: 135 mg/dL — ABNORMAL LOW (ref 212.0–360.0)

## 2014-09-03 LAB — FERRITIN: Ferritin: 910.5 ng/mL — ABNORMAL HIGH (ref 22.0–322.0)

## 2014-09-03 LAB — PSA: PSA: 0.19 ng/mL (ref 0.10–4.00)

## 2014-09-10 ENCOUNTER — Encounter: Payer: Self-pay | Admitting: Family Medicine

## 2014-09-10 ENCOUNTER — Ambulatory Visit (INDEPENDENT_AMBULATORY_CARE_PROVIDER_SITE_OTHER): Payer: Medicare Other | Admitting: Family Medicine

## 2014-09-10 ENCOUNTER — Other Ambulatory Visit (INDEPENDENT_AMBULATORY_CARE_PROVIDER_SITE_OTHER): Payer: Medicare Other

## 2014-09-10 VITALS — BP 122/78 | HR 72 | Temp 98.3°F | Ht 70.0 in | Wt 175.0 lb

## 2014-09-10 DIAGNOSIS — H3552 Pigmentary retinal dystrophy: Secondary | ICD-10-CM

## 2014-09-10 DIAGNOSIS — Z7189 Other specified counseling: Secondary | ICD-10-CM | POA: Insufficient documentation

## 2014-09-10 DIAGNOSIS — Z0000 Encounter for general adult medical examination without abnormal findings: Secondary | ICD-10-CM | POA: Insufficient documentation

## 2014-09-10 DIAGNOSIS — J0100 Acute maxillary sinusitis, unspecified: Secondary | ICD-10-CM

## 2014-09-10 LAB — CBC WITH DIFFERENTIAL/PLATELET
BASOS PCT: 0.4 % (ref 0.0–3.0)
Basophils Absolute: 0 10*3/uL (ref 0.0–0.1)
Eosinophils Absolute: 0.1 10*3/uL (ref 0.0–0.7)
Eosinophils Relative: 1.4 % (ref 0.0–5.0)
HCT: 41 % (ref 39.0–52.0)
HEMOGLOBIN: 14.1 g/dL (ref 13.0–17.0)
LYMPHS ABS: 2.3 10*3/uL (ref 0.7–4.0)
LYMPHS PCT: 26.1 % (ref 12.0–46.0)
MCHC: 34.4 g/dL (ref 30.0–36.0)
MCV: 94.6 fl (ref 78.0–100.0)
MONOS PCT: 4.4 % (ref 3.0–12.0)
Monocytes Absolute: 0.4 10*3/uL (ref 0.1–1.0)
Neutro Abs: 5.8 10*3/uL (ref 1.4–7.7)
Neutrophils Relative %: 67.7 % (ref 43.0–77.0)
Platelets: 293 10*3/uL (ref 150.0–400.0)
RBC: 4.33 Mil/uL (ref 4.22–5.81)
RDW: 13.3 % (ref 11.5–15.5)
WBC: 8.6 10*3/uL (ref 4.0–10.5)

## 2014-09-10 MED ORDER — DOXYCYCLINE HYCLATE 100 MG PO CAPS: 100.0000 mg | ORAL_CAPSULE | Freq: Two times a day (BID) | ORAL | Status: DC

## 2014-09-10 MED ORDER — FLUTICASONE PROPIONATE 50 MCG/ACT NA SUSP: 2.0000 | Freq: Every day | NASAL | Status: DC

## 2014-09-10 NOTE — Assessment & Plan Note (Signed)
Persistent L maxillary sinusitis sxs - treat with doxy course as well as start flonase (but check with ophtho prior to starting INS).

## 2014-09-10 NOTE — Assessment & Plan Note (Signed)
Preventative protocols reviewed and updated unless pt declined. Discussed healthy diet and lifestyle.  

## 2014-09-10 NOTE — Patient Instructions (Addendum)
Good to see you today, call us with questions  For persistent sinus congestion - take doxycycline course and check with eye doctor if we can start flonase nasal steroid. Return as needed or in 1 year for next medicare wellness visit

## 2014-09-10 NOTE — Progress Notes (Signed)
Pre visit review using our clinic review tool, if applicable. No additional management support is needed unless otherwise documented below in the visit note. 

## 2014-09-10 NOTE — Progress Notes (Signed)
BP 122/78 mmHg  Pulse 72  Temp(Src) 98.3 F (36.8 C) (Oral)  Ht 5\' 10"  (1.778 m)  Wt 175 lb (79.379 kg)  BMI 25.11 kg/m2   CC: CPE  Subjective:    Patient ID: Michael Vaughan, male    DOB: 04/17/1966, 49 y.o.   MRN: [id number]  HPI: Michael Vaughan is a 49 y.o. male presenting on 09/10/2014 for Annual Exam   Hereditary hemochromatosis - monthly blood draws through GI.   Recent eye injury (broken broom handle) seeing Dr Allena KatzPatel ophtho for this as well as h/o retinitis pigmentosa. Due for f/u with Dr Hazle Quantigby.  Recent sinus infection - treated with levaquin and pseudophed. Persistent head congestion despite decongestant, benadryl, antihistamine, mucinex and nasal saline. +seasonal allergies. Persistent R maxillary sinus pain/pressure.  Hearing screen passed. Vision screen - known issue, sees ophtho regularly. Denies falls/depression,anhedonia,sadness.  Preventative: Prostate cancer screening - brother developed prostate cancer at age 50 yo. Discussed. Would like to continue screening regularly. Tdap - 05/2012  Flu shot - 02/2013  Advanced directive - has not set up. would want wife to be HCPOA.  Seat belt use discussed Sunscreen use discussed   Caffeine: 4-6 cups coffee  Lives with wife, 1 son, other grown children, dog and cows  Occupation: on disability for vision loss, legally blind, raises cows  Edu: HS  Activity: raises cows, some hunting/fishing  Diet: good water, daily fruits/vegetables   Relevant past medical, surgical, family and social history reviewed and updated as indicated. Interim medical history since our last visit reviewed. Allergies and medications reviewed and updated. Current Outpatient Prescriptions on File Prior to Visit  Medication Sig  . pilocarpine (PILOCAR) 1 % ophthalmic solution Place 1 drop into the left eye 2 (two) times daily.   . prednisoLONE acetate (PRED FORTE) 1 % ophthalmic suspension Place 1 drop into the right eye 4 (four) times daily.    . pseudoephedrine (SUDAFED) 30 MG tablet Take 30 mg by mouth as needed for congestion.   No current facility-administered medications on file prior to visit.    Review of Systems  Constitutional: Positive for fever (recent sinusitis). Negative for chills, activity change, appetite change, fatigue and unexpected weight change.  HENT: Positive for congestion, rhinorrhea and sinus pressure. Negative for hearing loss.        Recent sinus infection  Eyes: Negative for visual disturbance.  Respiratory: Negative for cough, chest tightness, shortness of breath and wheezing.   Cardiovascular: Negative for chest pain, palpitations and leg swelling.  Gastrointestinal: Negative for nausea, vomiting, abdominal pain, diarrhea, constipation, blood in stool and abdominal distention.  Genitourinary: Negative for hematuria and difficulty urinating.  Musculoskeletal: Negative for myalgias, arthralgias and neck pain.  Skin: Negative for rash.  Neurological: Positive for headaches (sinus pressure). Negative for dizziness, seizures and syncope.  Hematological: Negative for adenopathy. Does not bruise/bleed easily.  Psychiatric/Behavioral: Negative for dysphoric mood. The patient is not nervous/anxious.    Per HPI unless specifically indicated above     Objective:    BP 122/78 mmHg  Pulse 72  Temp(Src) 98.3 F (36.8 C) (Oral)  Ht 5\' 10"  (1.778 m)  Wt 175 lb (79.379 kg)  BMI 25.11 kg/m2  Wt Readings from Last 3 Encounters:  09/10/14 175 lb (79.379 kg)  08/20/14 172 lb 8 oz (78.245 kg)  01/14/14 150 lb (68.04 kg)    Physical Exam  Constitutional: He is oriented to person, place, and time. He appears well-developed and well-nourished. No distress.  HENT:  Head:  Normocephalic and atraumatic.  Right Ear: Hearing, tympanic membrane, external ear and ear canal normal.  Left Ear: Hearing, tympanic membrane, external ear and ear canal normal.  Nose: Right sinus exhibits no maxillary sinus tenderness and  no frontal sinus tenderness. Left sinus exhibits maxillary sinus tenderness. Left sinus exhibits no frontal sinus tenderness.  Mouth/Throat: Uvula is midline, oropharynx is clear and moist and mucous membranes are normal. No oropharyngeal exudate, posterior oropharyngeal edema or posterior oropharyngeal erythema.  Eyes: EOM are normal. Pupils are equal, round, and reactive to light. Right conjunctiva has a hemorrhage. No scleral icterus.  R conjunctival hemorrhage after injury  Neck: Normal range of motion. Neck supple. No thyromegaly present.  Cardiovascular: Normal rate, regular rhythm, normal heart sounds and intact distal pulses.   No murmur heard. Pulses:      Radial pulses are 2+ on the right side, and 2+ on the left side.  Pulmonary/Chest: Effort normal and breath sounds normal. No respiratory distress. He has no wheezes. He has no rales.  Abdominal: Soft. Bowel sounds are normal. He exhibits no distension and no mass. There is no tenderness. There is no rebound and no guarding.  Genitourinary: Rectum normal and prostate normal. Rectal exam shows no external hemorrhoid, no internal hemorrhoid, no fissure, no mass, no tenderness and anal tone normal. Prostate is not enlarged and not tender.  Musculoskeletal: Normal range of motion. He exhibits no edema.  Lymphadenopathy:    He has no cervical adenopathy.  Neurological: He is alert and oriented to person, place, and time.  CN grossly intact, station and gait intact  Skin: Skin is warm and dry. No rash noted.  Psychiatric: He has a normal mood and affect. His behavior is normal. Judgment and thought content normal.  Nursing note and vitals reviewed.  Results for orders placed or performed in visit on 09/03/14  Lipid panel  Result Value Ref Range   Cholesterol 91 0 - 200 mg/dL   Triglycerides 04.5 0.0 - 149.0 mg/dL   HDL 40.98 (L) >11.91 mg/dL   VLDL 7.4 0.0 - 47.8 mg/dL   LDL Cholesterol 45 0 - 99 mg/dL   Total CHOL/HDL Ratio 2     NonHDL 52.00   Comprehensive metabolic panel  Result Value Ref Range   Sodium 140 135 - 145 mEq/L   Potassium 4.7 3.5 - 5.1 mEq/L   Chloride 105 96 - 112 mEq/L   CO2 32 19 - 32 mEq/L   Glucose, Bld 96 70 - 99 mg/dL   BUN 18 6 - 23 mg/dL   Creatinine, Ser 2.95 0.40 - 1.50 mg/dL   Total Bilirubin 1.0 0.2 - 1.2 mg/dL   Alkaline Phosphatase 64 39 - 117 U/L   AST 21 0 - 37 U/L   ALT 23 0 - 53 U/L   Total Protein 6.6 6.0 - 8.3 g/dL   Albumin 3.9 3.5 - 5.2 g/dL   Calcium 9.3 8.4 - 62.1 mg/dL   GFR 30.86 >57.84 mL/min  PSA  Result Value Ref Range   PSA 0.19 0.10 - 4.00 ng/mL  CBC with Differential/Platelet  Result Value Ref Range   WBC 9.9 4.0 - 10.5 K/uL   RBC 4.48 4.22 - 5.81 Mil/uL   Hemoglobin 14.8 13.0 - 17.0 g/dL   HCT 69.6 29.5 - 28.4 %   MCV 94.5 78.0 - 100.0 fl   MCHC 34.9 30.0 - 36.0 g/dL   RDW 13.2 44.0 - 10.2 %   Platelets 316.0 150.0 - 400.0 K/uL  Neutrophils Relative % 67.4 43.0 - 77.0 %   Lymphocytes Relative 24.9 12.0 - 46.0 %   Monocytes Relative 5.9 3.0 - 12.0 %   Eosinophils Relative 1.2 0.0 - 5.0 %   Basophils Relative 0.6 0.0 - 3.0 %   Neutro Abs 6.7 1.4 - 7.7 K/uL   Lymphs Abs 2.5 0.7 - 4.0 K/uL   Monocytes Absolute 0.6 0.1 - 1.0 K/uL   Eosinophils Absolute 0.1 0.0 - 0.7 K/uL   Basophils Absolute 0.1 0.0 - 0.1 K/uL  IBC panel  Result Value Ref Range   Iron 125 42 - 165 ug/dL   Transferrin 161.0 (L) 212.0 - 360.0 mg/dL   Saturation Ratios 96.0 (H) 20.0 - 50.0 %  Ferritin  Result Value Ref Range   Ferritin 910.5 (H) 22.0 - 322.0 ng/mL      Assessment & Plan:   Problem List Items Addressed This Visit    Retinitis pigmentosa    Follows regularly with ophtho.      Medicare annual wellness visit, subsequent - Primary    I have personally reviewed the Medicare Annual Wellness questionnaire and have noted 1. The patient's medical and social history 2. Their use of alcohol, tobacco or illicit drugs 3. Their current medications and  supplements 4. The patient's functional ability including ADL's, fall risks, home safety risks and hearing or visual impairment. 5. Diet and physical activity 6. Evidence for depression or mood disorders The patients weight, height, BMI have been recorded in the chart.  Hearing and vision has been addressed. I have made referrals, counseling and provided education to the patient based review of the above and I have provided the pt with a written personalized care plan for preventive services. Provider list updated - see scanned questionairre. Reviewed preventative protocols and updated unless pt declined.       Hereditary hemochromatosis    Monthly blood draws per GI      Health maintenance examination    Preventative protocols reviewed and updated unless pt declined. Discussed healthy diet and lifestyle.       Advanced care planning/counseling discussion    Advanced directive - has not set up. would want wife to be HCPOA.      Acute sinusitis    Persistent L maxillary sinusitis sxs - treat with doxy course as well as start flonase (but check with ophtho prior to starting INS).      Relevant Medications   doxy 10d   flonase       Follow up plan: Return as needed, for annual exam, prior fasting for blood work.

## 2014-09-10 NOTE — Assessment & Plan Note (Signed)
Advanced directive - has not set up. would want wife to be HCPOA.

## 2014-09-10 NOTE — Assessment & Plan Note (Signed)
Monthly blood draws per GI

## 2014-09-10 NOTE — Assessment & Plan Note (Signed)
Follows regularly with ophtho. 

## 2014-09-10 NOTE — Assessment & Plan Note (Signed)

## 2014-10-10 ENCOUNTER — Other Ambulatory Visit (INDEPENDENT_AMBULATORY_CARE_PROVIDER_SITE_OTHER): Payer: Medicare Other

## 2014-10-10 LAB — CBC WITH DIFFERENTIAL/PLATELET
Basophils Absolute: 0 10*3/uL (ref 0.0–0.1)
Basophils Relative: 0.4 % (ref 0.0–3.0)
EOS PCT: 2.1 % (ref 0.0–5.0)
Eosinophils Absolute: 0.2 10*3/uL (ref 0.0–0.7)
HCT: 41.4 % (ref 39.0–52.0)
HEMOGLOBIN: 14.5 g/dL (ref 13.0–17.0)
LYMPHS ABS: 2.7 10*3/uL (ref 0.7–4.0)
Lymphocytes Relative: 34.7 % (ref 12.0–46.0)
MCHC: 34.9 g/dL (ref 30.0–36.0)
MCV: 93.6 fl (ref 78.0–100.0)
Monocytes Absolute: 0.4 10*3/uL (ref 0.1–1.0)
Monocytes Relative: 5.1 % (ref 3.0–12.0)
NEUTROS PCT: 57.7 % (ref 43.0–77.0)
Neutro Abs: 4.5 10*3/uL (ref 1.4–7.7)
Platelets: 229 10*3/uL (ref 150.0–400.0)
RBC: 4.42 Mil/uL (ref 4.22–5.81)
RDW: 13.5 % (ref 11.5–15.5)
WBC: 7.7 10*3/uL (ref 4.0–10.5)

## 2014-12-11 ENCOUNTER — Other Ambulatory Visit (INDEPENDENT_AMBULATORY_CARE_PROVIDER_SITE_OTHER): Payer: Medicare Other

## 2014-12-11 LAB — CBC WITH DIFFERENTIAL/PLATELET
BASOS ABS: 0 10*3/uL (ref 0.0–0.1)
BASOS PCT: 0.5 % (ref 0.0–3.0)
EOS ABS: 0.1 10*3/uL (ref 0.0–0.7)
Eosinophils Relative: 1.3 % (ref 0.0–5.0)
HCT: 46.1 % (ref 39.0–52.0)
Hemoglobin: 15.7 g/dL (ref 13.0–17.0)
LYMPHS ABS: 2.7 10*3/uL (ref 0.7–4.0)
LYMPHS PCT: 29.8 % (ref 12.0–46.0)
MCHC: 34.1 g/dL (ref 30.0–36.0)
MCV: 94.1 fl (ref 78.0–100.0)
Monocytes Absolute: 0.5 10*3/uL (ref 0.1–1.0)
Monocytes Relative: 5.8 % (ref 3.0–12.0)
NEUTROS PCT: 62.6 % (ref 43.0–77.0)
Neutro Abs: 5.7 10*3/uL (ref 1.4–7.7)
PLATELETS: 271 10*3/uL (ref 150.0–400.0)
RBC: 4.89 Mil/uL (ref 4.22–5.81)
RDW: 13.4 % (ref 11.5–15.5)
WBC: 9.1 10*3/uL (ref 4.0–10.5)

## 2015-01-14 ENCOUNTER — Other Ambulatory Visit (INDEPENDENT_AMBULATORY_CARE_PROVIDER_SITE_OTHER): Payer: Medicare Other

## 2015-01-14 LAB — CBC WITH DIFFERENTIAL/PLATELET
BASOS PCT: 0.5 % (ref 0.0–3.0)
Basophils Absolute: 0 10*3/uL (ref 0.0–0.1)
EOS ABS: 0.1 10*3/uL (ref 0.0–0.7)
EOS PCT: 1.2 % (ref 0.0–5.0)
HEMATOCRIT: 41.8 % (ref 39.0–52.0)
Hemoglobin: 14.7 g/dL (ref 13.0–17.0)
LYMPHS ABS: 2.4 10*3/uL (ref 0.7–4.0)
LYMPHS PCT: 31 % (ref 12.0–46.0)
MCHC: 35.1 g/dL (ref 30.0–36.0)
MCV: 94.3 fl (ref 78.0–100.0)
MONO ABS: 0.4 10*3/uL (ref 0.1–1.0)
Monocytes Relative: 5.1 % (ref 3.0–12.0)
Neutro Abs: 4.8 10*3/uL (ref 1.4–7.7)
Neutrophils Relative %: 62.2 % (ref 43.0–77.0)
PLATELETS: 237 10*3/uL (ref 150.0–400.0)
RBC: 4.44 Mil/uL (ref 4.22–5.81)
RDW: 13.9 % (ref 11.5–15.5)
WBC: 7.7 10*3/uL (ref 4.0–10.5)

## 2015-01-15 ENCOUNTER — Other Ambulatory Visit: Payer: Self-pay

## 2015-02-06 ENCOUNTER — Other Ambulatory Visit (INDEPENDENT_AMBULATORY_CARE_PROVIDER_SITE_OTHER): Payer: Medicare Other

## 2015-02-06 LAB — CBC WITH DIFFERENTIAL/PLATELET
BASOS ABS: 0 10*3/uL (ref 0.0–0.1)
Basophils Relative: 0.6 % (ref 0.0–3.0)
EOS PCT: 1.6 % (ref 0.0–5.0)
Eosinophils Absolute: 0.1 10*3/uL (ref 0.0–0.7)
HEMATOCRIT: 42.5 % (ref 39.0–52.0)
Hemoglobin: 14.8 g/dL (ref 13.0–17.0)
LYMPHS ABS: 2.4 10*3/uL (ref 0.7–4.0)
LYMPHS PCT: 37.4 % (ref 12.0–46.0)
MCHC: 34.8 g/dL (ref 30.0–36.0)
MCV: 95.9 fl (ref 78.0–100.0)
MONOS PCT: 6 % (ref 3.0–12.0)
Monocytes Absolute: 0.4 10*3/uL (ref 0.1–1.0)
NEUTROS ABS: 3.5 10*3/uL (ref 1.4–7.7)
NEUTROS PCT: 54.4 % (ref 43.0–77.0)
PLATELETS: 222 10*3/uL (ref 150.0–400.0)
RBC: 4.43 Mil/uL (ref 4.22–5.81)
RDW: 14 % (ref 11.5–15.5)
WBC: 6.4 10*3/uL (ref 4.0–10.5)

## 2015-02-06 LAB — FERRITIN: FERRITIN: 646.3 ng/mL — AB (ref 22.0–322.0)

## 2015-02-06 LAB — IBC PANEL
Iron: 175 ug/dL — ABNORMAL HIGH (ref 42–165)
Saturation Ratios: 81.2 % — ABNORMAL HIGH (ref 20.0–50.0)
Transferrin: 154 mg/dL — ABNORMAL LOW (ref 212.0–360.0)

## 2015-03-13 ENCOUNTER — Other Ambulatory Visit (INDEPENDENT_AMBULATORY_CARE_PROVIDER_SITE_OTHER): Payer: Medicare Other

## 2015-03-13 LAB — CBC WITH DIFFERENTIAL/PLATELET
BASOS PCT: 0.3 % (ref 0.0–3.0)
Basophils Absolute: 0 10*3/uL (ref 0.0–0.1)
EOS PCT: 1.8 % (ref 0.0–5.0)
Eosinophils Absolute: 0.2 10*3/uL (ref 0.0–0.7)
HEMATOCRIT: 45.2 % (ref 39.0–52.0)
HEMOGLOBIN: 15.3 g/dL (ref 13.0–17.0)
LYMPHS PCT: 36.8 % (ref 12.0–46.0)
Lymphs Abs: 3.1 10*3/uL (ref 0.7–4.0)
MCHC: 33.8 g/dL (ref 30.0–36.0)
MCV: 96.5 fl (ref 78.0–100.0)
MONO ABS: 0.5 10*3/uL (ref 0.1–1.0)
MONOS PCT: 5.8 % (ref 3.0–12.0)
Neutro Abs: 4.7 10*3/uL (ref 1.4–7.7)
Neutrophils Relative %: 55.3 % (ref 43.0–77.0)
Platelets: 261 10*3/uL (ref 150.0–400.0)
RBC: 4.68 Mil/uL (ref 4.22–5.81)
RDW: 13.3 % (ref 11.5–15.5)
WBC: 8.5 10*3/uL (ref 4.0–10.5)

## 2015-04-13 ENCOUNTER — Other Ambulatory Visit (INDEPENDENT_AMBULATORY_CARE_PROVIDER_SITE_OTHER): Payer: Medicare Other

## 2015-04-13 LAB — CBC WITH DIFFERENTIAL/PLATELET
BASOS ABS: 0 10*3/uL (ref 0.0–0.1)
Basophils Relative: 0.4 % (ref 0.0–3.0)
Eosinophils Absolute: 0.1 10*3/uL (ref 0.0–0.7)
Eosinophils Relative: 1.3 % (ref 0.0–5.0)
HCT: 44.4 % (ref 39.0–52.0)
HEMOGLOBIN: 15.2 g/dL (ref 13.0–17.0)
LYMPHS ABS: 2 10*3/uL (ref 0.7–4.0)
Lymphocytes Relative: 26 % (ref 12.0–46.0)
MCHC: 34.3 g/dL (ref 30.0–36.0)
MCV: 95 fl (ref 78.0–100.0)
MONO ABS: 0.5 10*3/uL (ref 0.1–1.0)
MONOS PCT: 6.5 % (ref 3.0–12.0)
NEUTROS PCT: 65.8 % (ref 43.0–77.0)
Neutro Abs: 5.2 10*3/uL (ref 1.4–7.7)
Platelets: 234 10*3/uL (ref 150.0–400.0)
RBC: 4.68 Mil/uL (ref 4.22–5.81)
RDW: 13.4 % (ref 11.5–15.5)
WBC: 7.8 10*3/uL (ref 4.0–10.5)

## 2015-04-27 ENCOUNTER — Encounter: Payer: Self-pay | Admitting: Gastroenterology

## 2015-06-11 ENCOUNTER — Other Ambulatory Visit (INDEPENDENT_AMBULATORY_CARE_PROVIDER_SITE_OTHER): Payer: Medicare Other

## 2015-06-11 LAB — CBC WITH DIFFERENTIAL/PLATELET
BASOS PCT: 0.4 % (ref 0.0–3.0)
Basophils Absolute: 0 10*3/uL (ref 0.0–0.1)
EOS PCT: 1.4 % (ref 0.0–5.0)
Eosinophils Absolute: 0.1 10*3/uL (ref 0.0–0.7)
HEMATOCRIT: 45.5 % (ref 39.0–52.0)
HEMOGLOBIN: 15.2 g/dL (ref 13.0–17.0)
LYMPHS PCT: 26.5 % (ref 12.0–46.0)
Lymphs Abs: 2.4 10*3/uL (ref 0.7–4.0)
MCHC: 33.4 g/dL (ref 30.0–36.0)
MCV: 95.9 fl (ref 78.0–100.0)
MONOS PCT: 4.6 % (ref 3.0–12.0)
Monocytes Absolute: 0.4 10*3/uL (ref 0.1–1.0)
NEUTROS ABS: 6.2 10*3/uL (ref 1.4–7.7)
Neutrophils Relative %: 67.1 % (ref 43.0–77.0)
PLATELETS: 250 10*3/uL (ref 150.0–400.0)
RBC: 4.75 Mil/uL (ref 4.22–5.81)
RDW: 13 % (ref 11.5–15.5)
WBC: 9.2 10*3/uL (ref 4.0–10.5)

## 2015-08-14 ENCOUNTER — Other Ambulatory Visit: Payer: Self-pay | Admitting: Gastroenterology

## 2015-08-14 ENCOUNTER — Other Ambulatory Visit (INDEPENDENT_AMBULATORY_CARE_PROVIDER_SITE_OTHER): Payer: Medicare Other

## 2015-08-14 LAB — CBC WITH DIFFERENTIAL/PLATELET
BASOS PCT: 0.6 % (ref 0.0–3.0)
Basophils Absolute: 0 10*3/uL (ref 0.0–0.1)
EOS ABS: 0.1 10*3/uL (ref 0.0–0.7)
EOS PCT: 1.5 % (ref 0.0–5.0)
HEMATOCRIT: 40.9 % (ref 39.0–52.0)
HEMOGLOBIN: 14.4 g/dL (ref 13.0–17.0)
LYMPHS PCT: 30.7 % (ref 12.0–46.0)
Lymphs Abs: 2.4 10*3/uL (ref 0.7–4.0)
MCHC: 35.1 g/dL (ref 30.0–36.0)
MCV: 93.4 fl (ref 78.0–100.0)
MONOS PCT: 4.7 % (ref 3.0–12.0)
Monocytes Absolute: 0.4 10*3/uL (ref 0.1–1.0)
Neutro Abs: 4.9 10*3/uL (ref 1.4–7.7)
Neutrophils Relative %: 62.5 % (ref 43.0–77.0)
Platelets: 242 10*3/uL (ref 150.0–400.0)
RBC: 4.38 Mil/uL (ref 4.22–5.81)
RDW: 13.2 % (ref 11.5–15.5)
WBC: 7.8 10*3/uL (ref 4.0–10.5)

## 2015-08-18 ENCOUNTER — Other Ambulatory Visit: Payer: Self-pay

## 2015-10-12 ENCOUNTER — Other Ambulatory Visit: Payer: Self-pay

## 2015-10-12 ENCOUNTER — Other Ambulatory Visit: Payer: Self-pay | Admitting: *Deleted

## 2015-10-12 ENCOUNTER — Other Ambulatory Visit (INDEPENDENT_AMBULATORY_CARE_PROVIDER_SITE_OTHER): Payer: Medicare Other

## 2015-10-12 LAB — IBC PANEL
Iron: 177 ug/dL — ABNORMAL HIGH (ref 42–165)
Saturation Ratios: 86 % — ABNORMAL HIGH (ref 20.0–50.0)
Transferrin: 147 mg/dL — ABNORMAL LOW (ref 212.0–360.0)

## 2015-10-12 LAB — CBC WITH DIFFERENTIAL/PLATELET
Basophils Absolute: 0 10*3/uL (ref 0.0–0.1)
Basophils Relative: 0.5 % (ref 0.0–3.0)
EOS PCT: 1.6 % (ref 0.0–5.0)
Eosinophils Absolute: 0.1 10*3/uL (ref 0.0–0.7)
HEMATOCRIT: 40.7 % (ref 39.0–52.0)
HEMOGLOBIN: 14 g/dL (ref 13.0–17.0)
LYMPHS PCT: 24.6 % (ref 12.0–46.0)
Lymphs Abs: 2.3 10*3/uL (ref 0.7–4.0)
MCHC: 34.5 g/dL (ref 30.0–36.0)
MCV: 95.1 fl (ref 78.0–100.0)
MONO ABS: 0.4 10*3/uL (ref 0.1–1.0)
Monocytes Relative: 4.5 % (ref 3.0–12.0)
Neutro Abs: 6.4 10*3/uL (ref 1.4–7.7)
Neutrophils Relative %: 68.8 % (ref 43.0–77.0)
Platelets: 243 10*3/uL (ref 150.0–400.0)
RBC: 4.28 Mil/uL (ref 4.22–5.81)
RDW: 13.4 % (ref 11.5–15.5)
WBC: 9.3 10*3/uL (ref 4.0–10.5)

## 2015-10-13 LAB — FERRITIN: FERRITIN: 726.4 ng/mL — AB (ref 22.0–322.0)

## 2015-10-23 ENCOUNTER — Other Ambulatory Visit: Payer: Self-pay

## 2015-10-27 ENCOUNTER — Ambulatory Visit (INDEPENDENT_AMBULATORY_CARE_PROVIDER_SITE_OTHER): Payer: Medicare Other

## 2015-10-27 VITALS — BP 110/68 | HR 60 | Temp 97.8°F | Ht 69.5 in | Wt 170.0 lb

## 2015-10-27 DIAGNOSIS — Z125 Encounter for screening for malignant neoplasm of prostate: Secondary | ICD-10-CM

## 2015-10-27 DIAGNOSIS — Z114 Encounter for screening for human immunodeficiency virus [HIV]: Secondary | ICD-10-CM

## 2015-10-27 DIAGNOSIS — Z0000 Encounter for general adult medical examination without abnormal findings: Secondary | ICD-10-CM

## 2015-10-27 LAB — PSA, MEDICARE: PSA: 0.16 ng/mL (ref 0.10–4.00)

## 2015-10-27 LAB — COMPREHENSIVE METABOLIC PANEL
ALBUMIN: 4.2 g/dL (ref 3.5–5.2)
ALK PHOS: 67 U/L (ref 39–117)
ALT: 26 U/L (ref 0–53)
AST: 23 U/L (ref 0–37)
BILIRUBIN TOTAL: 1.6 mg/dL — AB (ref 0.2–1.2)
BUN: 15 mg/dL (ref 6–23)
CO2: 29 mEq/L (ref 19–32)
Calcium: 9.5 mg/dL (ref 8.4–10.5)
Chloride: 107 mEq/L (ref 96–112)
Creatinine, Ser: 0.73 mg/dL (ref 0.40–1.50)
GFR: 120.62 mL/min (ref >60.00)
Glucose, Bld: 110 mg/dL — ABNORMAL HIGH (ref 70–99)
POTASSIUM: 4.6 meq/L (ref 3.5–5.1)
Sodium: 142 mEq/L (ref 135–145)
TOTAL PROTEIN: 6.3 g/dL (ref 6.0–8.3)

## 2015-10-27 NOTE — Progress Notes (Signed)
Pre visit review using our clinic review tool, if applicable. No additional management support is needed unless otherwise documented below in the visit note. 

## 2015-10-27 NOTE — Patient Instructions (Signed)
[name] , Thank you for taking time to come for your Medicare Wellness Visit. I appreciate your ongoing commitment to your health goals. Please review the following plan we discussed and let me know if I can assist you in the future.   These are the goals we discussed: Goals    . Increase physical activity     Starting 10/27/2015, I will continue to walk at least 30 min daily.        This is a list of the screening recommended for you and due dates:  Health Maintenance  Topic Date Due  . Colon Cancer Screening  10/26/2016*  . Flu Shot  01/12/2016  . Tetanus Vaccine  05/24/2022  . DTaP/Tdap/Td vaccine  Completed  . HIV Screening  Completed  *Topic was postponed. The date shown is not the original due date.    Preventive Care for Adults  A healthy lifestyle and preventive care can promote health and wellness. Preventive health guidelines for adults include the following key practices.  . A routine yearly physical is a good way to check with your health care provider about your health and preventive screening. It is a chance to share any concerns and updates on your health and to receive a thorough exam.  . Visit your dentist for a routine exam and preventive care every 6 months. Brush your teeth twice a day and floss once a day. Good oral hygiene prevents tooth decay and gum disease.  . The frequency of eye exams is based on your age, health, family medical history, use  of contact lenses, and other factors. Follow your health care provider's ecommendations for frequency of eye exams.  . Eat a healthy diet. Foods like vegetables, fruits, whole grains, low-fat dairy products, and lean protein foods contain the nutrients you need without too many calories. Decrease your intake of foods high in solid fats, added sugars, and salt. Eat the right amount of calories for you. Get information about a proper diet from your health care provider, if necessary.  . Regular physical exercise is  one of the most important things you can do for your health. Most adults should get at least 150 minutes of moderate-intensity exercise (any activity that increases your heart rate and causes you to sweat) each week. In addition, most adults need muscle-strengthening exercises on 2 or more days a week.  Silver Sneakers may be a benefit available to you. To determine eligibility, you may visit the website: www.silversneakers.com or contact program at 61317857101-(276)502-3584 Mon-Fri between 8AM-8PM.   . Maintain a healthy weight. The body mass index (BMI) is a screening tool to identify possible weight problems. It provides an estimate of body fat based on height and weight. Your health care provider can find your BMI and can help you achieve or maintain a healthy weight.   For adults 20 years and older: ? A BMI below 18.5 is considered underweight. ? A BMI of 18.5 to 24.9 is normal. ? A BMI of 25 to 29.9 is considered overweight. ? A BMI of 30 and above is considered obese.   . Maintain normal blood lipids and cholesterol levels by exercising and minimizing your intake of saturated fat. Eat a balanced diet with plenty of fruit and vegetables. Blood tests for lipids and cholesterol should begin at age 51 and be repeated every 5 years. If your lipid or cholesterol levels are high, you are over 50, or you are at high risk for heart disease, you may need  your cholesterol levels checked more frequently. Ongoing high lipid and cholesterol levels should be treated with medicines if diet and exercise are not working.  . If you smoke, find out from your health care provider how to quit. If you do not use tobacco, please do not start.  . If you choose to drink alcohol, please do not consume more than 2 drinks per day. One drink is considered to be 12 ounces (355 mL) of beer, 5 ounces (148 mL) of wine, or 1.5 ounces (44 mL) of liquor.  . If you are 60-71 years old, ask your health care provider if you should take  aspirin to prevent strokes.  . Use sunscreen. Apply sunscreen liberally and repeatedly throughout the day. You should seek shade when your shadow is shorter than you. Protect yourself by wearing long sleeves, pants, a wide-brimmed hat, and sunglasses year round, whenever you are outdoors.  . Once a month, do a whole body skin exam, using a mirror to look at the skin on your back. Tell your health care provider of new moles, moles that have irregular borders, moles that are larger than a pencil eraser, or moles that have changed in shape or color.

## 2015-10-27 NOTE — Progress Notes (Signed)
PCP notes:  Health maintenance: HIV screening completed. Per pt, he plans to have colon cancer screening done during summer when wife is on summer break from job.   Abnormal screenings: None  Patient concerns: Pt states he has been experiencing an increase in intermittent cramps in legs, arms, and hands. These cramps occur mostly at night but may occur during day.

## 2015-10-27 NOTE — Progress Notes (Signed)
Subjective:   Michael Vaughan is a 50 y.o. male who presents for Medicare Annual/Subsequent preventive examination.  Review of Systems:  N/A Cardiac Risk Factors include: advanced age (>74men, >13 women);male gender     Objective:    Vitals: BP 110/68 mmHg  Pulse 60  Temp(Src) 97.8 F (36.6 C) (Oral)  Ht 5' 9.5" (1.765 m)  Wt 170 lb (77.111 kg)  BMI 24.75 kg/m2  SpO2 97%  Body mass index is 24.75 kg/(m^2).  Tobacco History  Smoking status  . Former Smoker -- 1.00 packs/day for 20 years  . Types: Cigarettes  . Quit date: 03/12/2004  Smokeless tobacco  . Current User  . Types: Snuff    Comment: quit in 2005, occasional smokeless tobacco     Ready to quit: No Counseling given: No   Past Medical History  Diagnosis Date  . Arthralgia     takes aleve regularly  . Retinitis pigmentosa   . Heartburn   . History of chicken pox   . History of migraine 1990s    remote  . Hereditary hemochromatosis 05/2013    iron overload with mild HM, homozygous for C282Y, monthly blood draws  . Arthritis    Past Surgical History  Procedure Laterality Date  . Cataract extraction bilateral w/ anterior vitrectomy Bilateral 2001  . Inguinal hernia repair  1972    at age 1  . Pars plana vitrectomy  01/24/2012    Procedure: PARS PLANA VITRECTOMY WITH 25G REMOVAL/SUTURE INTRAOCULAR LENS;  Surgeon: Sherrie George, MD;  Location: [facility] OR;  Service: Ophthalmology;  Laterality: Left;  . Pars plana vitrectomy Right 01/14/2014    dislocated intraocular lens - PARS PLANA VITRECTOMY WITH 25G REMOVAL/SUTURE SECONDARY INTRAOCULAR LENS;  Sherrie George, MD  . Photocoagulation with laser Right 01/14/2014    Procedure: PHOTOCOAGULATION WITH LASER;  Surgeon: Sherrie George, MD;  Location: [facility] OR;  Service: Ophthalmology;  Laterality: Right;  . Iridectomy Right 01/14/2014    Procedure: IRIDECTOMY;  Surgeon: Sherrie George, MD;  Location: [facility] OR;  Service: Ophthalmology;  Laterality: Right;   Family History    Problem Relation Age of Onset  . Colon cancer Paternal Aunt 46  . Leukemia Father     CLL spread to brain  . Breast cancer Paternal Aunt   . Prostate cancer Brother 79  . Hyperlipidemia Other     father's side  . CAD Paternal Uncle     MI, CABG  . Stroke Maternal Grandmother   . Diabetes Other     mother's side  . Hypertension Father     and mother and family  . Deep vein thrombosis Brother     with PE  . Stroke Maternal Grandfather   . Hypertension Mother    History  Sexual Activity  . Sexual Activity: Yes    Outpatient Encounter Prescriptions as of 10/27/2015  Medication Sig  . fluticasone (FLONASE) 50 MCG/ACT nasal spray Place 2 sprays into both nostrils daily. (Patient taking differently: Place 2 sprays into both nostrils as needed. )  . pilocarpine (PILOCAR) 1 % ophthalmic solution Place 1 drop into the left eye 2 (two) times daily.   . pseudoephedrine (SUDAFED) 30 MG tablet Take 30 mg by mouth as needed for congestion.  . [DISCONTINUED] bacitracin-polymyxin b (POLYSPORIN) ophthalmic ointment Place 1 application into the right eye every 12 (twelve) hours. apply to eye every 12 hours while awake  . [DISCONTINUED] doxycycline (VIBRAMYCIN) 100 MG capsule Take 1 capsule (100 mg total)  by mouth 2 (two) times daily.  . [DISCONTINUED] prednisoLONE acetate (PRED FORTE) 1 % ophthalmic suspension Place 1 drop into the right eye 4 (four) times daily.   No facility-administered encounter medications on file as of 10/27/2015.    Activities of Daily Living In your present state of health, do you have any difficulty performing the following activities: 10/27/2015  Hearing? N  Vision? N  Difficulty concentrating or making decisions? N  Walking or climbing stairs? N  Dressing or bathing? N  Doing errands, shopping? Y  Preparing Food and eating ? N  Using the Toilet? N  In the past six months, have you accidently leaked urine? N  Do you have problems with loss of bowel control? N   Managing your Medications? N  Managing your Finances? N  Housekeeping or managing your Housekeeping? N    Patient Care Team: Eustaquio Boyden, MD as PCP - General (Family Medicine) Sherrie George, MD as Consulting Physician (Ophthalmology) Meryl Dare, MD as Consulting Physician (Gastroenterology)   Assessment:     Hearing Screening           Right ear:   40 40 40 40   Left ear:   40 40 40 40   Vision Screening Comments: Last eye exam in August 2016   Exercise Activities and Dietary recommendations Current Exercise Habits: Home exercise routine, Type of exercise: walking;Other - see comments (pt also works on his farm), Time (Minutes): 30, Frequency (Times/Week): 7, Weekly Exercise (Minutes/Week): 210, Intensity: Moderate, Exercise limited by: None identified  Goals    . Increase physical activity     Starting 10/27/2015, I will continue to walk at least 30 min daily.       Fall Risk Fall Risk  10/27/2015 09/10/2014 05/24/2013 05/24/2012  Falls in the past year? No No No No   Depression Screen PHQ 2/9 Scores 10/27/2015 09/10/2014 05/24/2013 05/24/2012  PHQ - 2 Score 0 0 0 0    Cognitive Testing MMSE - Mini Mental State Exam 10/27/2015  Orientation to time 5  Orientation to Place 5  Registration 3  Attention/ Calculation 0  Recall 3  Language- name 2 objects 0  Language- repeat 1  Language- follow 3 step command 3  Language- read & follow direction 0  Write a sentence 0  Copy design 0  Total score 20   PLEASE NOTE: A Mini-Cog screen was completed. Maximum score is 20. A value of 0 denotes this part of Folstein MMSE was not completed or the patient failed this part of the Mini-Cog screening.   Mini-Cog Screening Orientation to Time - Max 5 pts Orientation to Place - Max 5 pts Registration - Max 3 pts Recall - Max 3 pts Language Repeat - Max 1 pts Language Follow 3 Step Command - Max 3 pts  Immunization History   Administered Date(s) Administered  . Influenza Split 03/13/2012, 02/11/2013  . Influenza-Unspecified 03/10/2014  . Tdap 05/24/2012   Screening Tests Health Maintenance  Topic Date Due  . COLONOSCOPY  10/26/2016 (Originally 04/19/2015)  . INFLUENZA VACCINE  01/12/2016  . TETANUS/TDAP  05/24/2022  . DTaP/Tdap/Td  Completed  . HIV Screening  Completed      Plan:     I have personally reviewed and addressed the Medicare Annual Wellness questionnaire and have noted the following in the patient's chart:  A. Medical and social history B. Use of alcohol, tobacco or illicit drugs  C. Current medications and supplements D. Functional ability and status  E.  Nutritional status F.  Physical activity G. Advance directives H. List of other physicians I.  Hospitalizations, surgeries, and ER visits in previous 12 months J.  Vitals K. Screenings to include hearing, vision, cognitive, depression L. Referrals and appointments - none  In addition, I have reviewed and discussed with patient certain preventive protocols, quality metrics, and best practice recommendations. A written personalized care plan for preventive services as well as general preventive health recommendations were provided to patient.  See attached scanned questionnaire for additional information.   Signed,   Randa EvensLesia Pinson, MHA, BS, LPN Health Advisor 10/27/2015

## 2015-10-28 LAB — HIV ANTIBODY (ROUTINE TESTING W REFLEX): HIV: NONREACTIVE

## 2015-10-28 NOTE — Progress Notes (Signed)
I reviewed health advisor's note, was available for consultation, and agree with documentation and plan.  

## 2015-11-05 ENCOUNTER — Encounter: Payer: Medicare Other | Admitting: Family Medicine

## 2015-11-05 ENCOUNTER — Encounter: Payer: Self-pay | Admitting: Family Medicine

## 2015-11-05 ENCOUNTER — Ambulatory Visit (INDEPENDENT_AMBULATORY_CARE_PROVIDER_SITE_OTHER): Payer: Medicare Other | Admitting: Family Medicine

## 2015-11-05 VITALS — BP 112/62 | HR 65 | Temp 98.1°F | Ht 70.0 in | Wt 176.5 lb

## 2015-11-05 DIAGNOSIS — Z0000 Encounter for general adult medical examination without abnormal findings: Secondary | ICD-10-CM | POA: Diagnosis not present

## 2015-11-05 DIAGNOSIS — R252 Cramp and spasm: Secondary | ICD-10-CM | POA: Insufficient documentation

## 2015-11-05 DIAGNOSIS — Z7189 Other specified counseling: Secondary | ICD-10-CM | POA: Diagnosis not present

## 2015-11-05 DIAGNOSIS — H3552 Pigmentary retinal dystrophy: Secondary | ICD-10-CM

## 2015-11-05 NOTE — Assessment & Plan Note (Addendum)
Pickle juice helps. Suggested magnesium OTC or coq10 OTC. Discussed possible trial tonic water nightly but rec against Rx quinine.

## 2015-11-05 NOTE — Assessment & Plan Note (Signed)
Advanced directive - has not set up. would want wife to be HCPOA. Received advanced directive packet last week

## 2015-11-05 NOTE — Assessment & Plan Note (Signed)
Appreciate ophtho care of patient.

## 2015-11-05 NOTE — Assessment & Plan Note (Signed)
Continue monthly blood draws per GI. Pt will check with GI about frequency of blood draws.

## 2015-11-05 NOTE — Assessment & Plan Note (Signed)
Preventative protocols reviewed and updated unless pt declined. Discussed healthy diet and lifestyle.  

## 2015-11-05 NOTE — Patient Instructions (Addendum)
Double check with Dr Russella DarStark about frequency of blood draws.  Consider starting magnesium or co enzyme q10 for cramping.  Good to see you today, call us with questions.   Health Maintenance, Male A healthy lifestyle and preventative care can promote health and wellness.  Maintain regular health, dental, and eye exams.  Eat a healthy diet. Foods like vegetables, fruits, whole grains, low-fat dairy products, and lean protein foods contain the nutrients you need and are low in calories. Decrease your intake of foods high in solid fats, added sugars, and salt. Get information about a proper diet from your health care provider, if necessary.  Regular physical exercise is one of the most important things you can do for your health. Most adults should get at least 150 minutes of moderate-intensity exercise (any activity that increases your heart rate and causes you to sweat) each week. In addition, most adults need muscle-strengthening exercises on 2 or more days a week.   Maintain a healthy weight. The body mass index (BMI) is a screening tool to identify possible weight problems. It provides an estimate of body fat based on height and weight. Your health care provider can find your BMI and can help you achieve or maintain a healthy weight. For males 20 years and older:  A BMI below 18.5 is considered underweight.  A BMI of 18.5 to 24.9 is normal.  A BMI of 25 to 29.9 is considered overweight.  A BMI of 30 and above is considered obese.  Maintain normal blood lipids and cholesterol by exercising and minimizing your intake of saturated fat. Eat a balanced diet with plenty of fruits and vegetables. Blood tests for lipids and cholesterol should begin at age 51 and be repeated every 5 years. If your lipid or cholesterol levels are high, you are over age 51, or you are at high risk for heart disease, you may need your cholesterol levels checked more frequently.Ongoing high lipid and cholesterol levels  should be treated with medicines if diet and exercise are not working.  If you smoke, find out from your health care provider how to quit. If you do not use tobacco, do not start.  Lung cancer screening is recommended for adults aged 55-80 years who are at high risk for developing lung cancer because of a history of smoking. A yearly low-dose CT scan of the lungs is recommended for people who have at least a 30-pack-year history of smoking and are current smokers or have quit within the past 15 years. A pack year of smoking is smoking an average of 1 pack of cigarettes a day for 1 year (for example, a 30-pack-year history of smoking could mean smoking 1 pack a day for 30 years or 2 packs a day for 15 years). Yearly screening should continue until the smoker has stopped smoking for at least 15 years. Yearly screening should be stopped for people who develop a health problem that would prevent them from having lung cancer treatment.  If you choose to drink alcohol, do not have more than 2 drinks per day. One drink is considered to be 12 oz (360 mL) of beer, 5 oz (150 mL) of wine, or 1.5 oz (45 mL) of liquor.  Avoid the use of street drugs. Do not share needles with anyone. Ask for help if you need support or instructions about stopping the use of drugs.  High blood pressure causes heart disease and increases the risk of stroke. High blood pressure is more likely to  develop in:  People who have blood pressure in the end of the normal range (100-139/85-89 mm Hg).  People who are overweight or obese.  People who are African American.  If you are 35-80 years of age, have your blood pressure checked every 3-5 years. If you are 76 years of age or older, have your blood pressure checked every year. You should have your blood pressure measured twice--once when you are at a hospital or clinic, and once when you are not at a hospital or clinic. Record the average of the two measurements. To check your blood  pressure when you are not at a hospital or clinic, you can use:  An automated blood pressure machine at a pharmacy.  A home blood pressure monitor.  If you are 84-84 years old, ask your health care provider if you should take aspirin to prevent heart disease.  Diabetes screening involves taking a blood sample to check your fasting blood sugar level. This should be done once every 3 years after age 87 if you are at a normal weight and without risk factors for diabetes. Testing should be considered at a younger age or be carried out more frequently if you are overweight and have at least 1 risk factor for diabetes.  Colorectal cancer can be detected and often prevented. Most routine colorectal cancer screening begins at the age of 97 and continues through age 63. However, your health care provider may recommend screening at an earlier age if you have risk factors for colon cancer. On a yearly basis, your health care provider may provide home test kits to check for hidden blood in the stool. A small camera at the end of a tube may be used to directly examine the colon (sigmoidoscopy or colonoscopy) to detect the earliest forms of colorectal cancer. Talk to your health care provider about this at age 104 when routine screening begins. A direct exam of the colon should be repeated every 5-10 years through age 45, unless early forms of precancerous polyps or small growths are found.  People who are at an increased risk for hepatitis B should be screened for this virus. You are considered at high risk for hepatitis B if:  You were born in a country where hepatitis B occurs often. Talk with your health care provider about which countries are considered high risk.  Your parents were born in a high-risk country and you have not received a shot to protect against hepatitis B (hepatitis B vaccine).  You have HIV or AIDS.  You use needles to inject street drugs.  You live with, or have sex with, someone who  has hepatitis B.  You are a man who has sex with other men (MSM).  You get hemodialysis treatment.  You take certain medicines for conditions like cancer, organ transplantation, and autoimmune conditions.  Hepatitis C blood testing is recommended for all people born from [birth year] through 1966 and any individual with known risk factors for hepatitis C.  Healthy men should no longer receive prostate-specific antigen (PSA) blood tests as part of routine cancer screening. Talk to your health care provider about prostate cancer screening.  Testicular cancer screening is not recommended for adolescents or adult males who have no symptoms. Screening includes self-exam, a health care provider exam, and other screening tests. Consult with your health care provider about any symptoms you have or any concerns you have about testicular cancer.  Practice safe sex. Use condoms and avoid high-risk sexual practices to reduce  the spread of sexually transmitted infections (STIs).  You should be screened for STIs, including gonorrhea and chlamydia if:  You are sexually active and are younger than 24 years.  You are older than 24 years, and your health care provider tells you that you are at risk for this type of infection.  Your sexual activity has changed since you were last screened, and you are at an increased risk for chlamydia or gonorrhea. Ask your health care provider if you are at risk.  If you are at risk of being infected with HIV, it is recommended that you take a prescription medicine daily to prevent HIV infection. This is called pre-exposure prophylaxis (PrEP). You are considered at risk if:  You are a man who has sex with other men (MSM).  You are a heterosexual man who is sexually active with multiple partners.  You take drugs by injection.  You are sexually active with a partner who has HIV.  Talk with your health care provider about whether you are at high risk of being infected with  HIV. If you choose to begin PrEP, you should first be tested for HIV. You should then be tested every 3 months for as long as you are taking PrEP.  Use sunscreen. Apply sunscreen liberally and repeatedly throughout the day. You should seek shade when your shadow is shorter than you. Protect yourself by wearing long sleeves, pants, a wide-brimmed hat, and sunglasses year round whenever you are outdoors.  Tell your health care provider of new moles or changes in moles, especially if there is a change in shape or color. Also, tell your health care provider if a mole is larger than the size of a pencil eraser.  A one-time screening for abdominal aortic aneurysm (AAA) and surgical repair of large AAAs by ultrasound is recommended for men aged 41-75 years who are current or former smokers.  Stay current with your vaccines (immunizations).   This information is not intended to replace advice given to you by your health care provider. Make sure you discuss any questions you have with your health care provider.   Document Released: 11/26/2007 Document Revised: 06/20/2014 Document Reviewed: 10/25/2010 Elsevier Interactive Patient Education [employer].

## 2015-11-05 NOTE — Progress Notes (Signed)
BP 112/62 mmHg  Pulse 65  Temp(Src) 98.1 F (36.7 C) (Oral)  Ht 5\' 10"  (1.778 m)  Wt 176 lb 8 oz (80.06 kg)  BMI 25.33 kg/m2  SpO2 97%   CC: CPE  Subjective:    Patient ID: Michael Vaughan, male    DOB: June 27, 1965, 50 y.o.   MRN: [id number]  HPI: Michael Vaughan is a 50 y.o. male presenting on 11/05/2015 for Annual Exam   Saw Virl Axe for medicare wellness visit last week, note reviewed.   Increased cramping of extremities worse at night time.  Hereditary hemochromatosis - has missed last few monthly blood draws. Some question about whether he should get monthly or Q3 mo blood draws.   Preventative: Colon cancer screening - planning on scheduling colonoscopy this summer.  Prostate cancer screening - brother developed prostate cancer at age 51 yo. Discussed. Would like to continue screening regularly. Flu shot - yearly Tdap - 05/2012  Advanced directive - has not set up. would want wife to be HCPOA. Received advanced directive packet last week Seat belt use discussed Sunscreen use discussed. No changing moles on skin.  Caffeine: 4-6 cups coffee  Lives with wife, 1 son, other grown children, dog and cows  Occupation: on disability for vision loss, legally blind, raises cows  Edu: HS  Activity: raises cows, some hunting/fishing  Diet: good water, daily fruits/vegetables   Relevant past medical, surgical, family and social history reviewed and updated as indicated. Interim medical history since our last visit reviewed. Allergies and medications reviewed and updated. Current Outpatient Prescriptions on File Prior to Visit  Medication Sig  . fluticasone (FLONASE) 50 MCG/ACT nasal spray Place 2 sprays into both nostrils daily. (Patient taking differently: Place 2 sprays into both nostrils as needed. )  . pilocarpine (PILOCAR) 1 % ophthalmic solution Place 1 drop into the left eye 2 (two) times daily.    No current facility-administered medications on file prior to visit.     Review of Systems  Constitutional: Negative for fever, chills, activity change, appetite change, fatigue and unexpected weight change.  HENT: Negative for drooling and hearing loss.   Eyes: Negative for visual disturbance.  Respiratory: Negative for cough, chest tightness, shortness of breath and wheezing.   Cardiovascular: Negative for chest pain, palpitations and leg swelling.  Gastrointestinal: Negative for nausea, vomiting, abdominal pain, diarrhea, constipation, blood in stool and abdominal distention.       Normally stools 2-3 x/wk, soft formed stool  Genitourinary: Negative for hematuria and difficulty urinating.  Musculoskeletal: Negative for myalgias, arthralgias and neck pain.  Skin: Negative for rash.  Neurological: Negative for dizziness, seizures, syncope and headaches.  Hematological: Negative for adenopathy. Does not bruise/bleed easily.  Psychiatric/Behavioral: Negative for dysphoric mood. The patient is not nervous/anxious.    Per HPI unless specifically indicated in ROS section     Objective:    BP 112/62 mmHg  Pulse 65  Temp(Src) 98.1 F (36.7 C) (Oral)  Ht 5\' 10"  (1.778 m)  Wt 176 lb 8 oz (80.06 kg)  BMI 25.33 kg/m2  SpO2 97%  Wt Readings from Last 3 Encounters:  11/05/15 176 lb 8 oz (80.06 kg)  10/27/15 170 lb (77.111 kg)  09/10/14 175 lb (79.379 kg)    Physical Exam  Constitutional: He is oriented to person, place, and time. He appears well-developed and well-nourished. No distress.  HENT:  Head: Normocephalic and atraumatic.  Right Ear: Hearing, tympanic membrane, external ear and ear canal normal.  Left Ear: Hearing, tympanic  membrane, external ear and ear canal normal.  Nose: Nose normal.  Mouth/Throat: Uvula is midline, oropharynx is clear and moist and mucous membranes are normal. No oropharyngeal exudate, posterior oropharyngeal edema or posterior oropharyngeal erythema.  Eyes: Conjunctivae and EOM are normal. Pupils are equal, round, and  reactive to light. No scleral icterus.  Neck: Normal range of motion. Neck supple. No thyromegaly present.  Cardiovascular: Normal rate, regular rhythm, normal heart sounds and intact distal pulses.   No murmur heard. Pulses:      Radial pulses are 2+ on the right side, and 2+ on the left side.  Pulmonary/Chest: Effort normal and breath sounds normal. No respiratory distress. He has no wheezes. He has no rales.  Abdominal: Soft. Bowel sounds are normal. He exhibits no distension and no mass. There is no tenderness. There is no rebound and no guarding.  Genitourinary: Rectum normal and prostate normal. Rectal exam shows no external hemorrhoid, no internal hemorrhoid, no fissure, no mass, no tenderness and anal tone normal. Prostate is not enlarged (15gm) and not tender.  Musculoskeletal: Normal range of motion. He exhibits no edema.  Lymphadenopathy:    He has no cervical adenopathy.  Neurological: He is alert and oriented to person, place, and time.  CN grossly intact, station and gait intact  Skin: Skin is warm and dry. No rash noted.  Psychiatric: He has a normal mood and affect. His behavior is normal. Judgment and thought content normal.  Nursing note and vitals reviewed.  Results for orders placed or performed in visit on 10/27/15  HIV antibody (with reflex)  Result Value Ref Range   HIV 1&2 Ab, 4th Generation NONREACTIVE NONREACTIVE  PSA, Medicare  Result Value Ref Range   PSA 0.16 0.10 - 4.00 ng/ml  Comprehensive metabolic panel  Result Value Ref Range   Sodium 142 135 - 145 mEq/L   Potassium 4.6 3.5 - 5.1 mEq/L   Chloride 107 96 - 112 mEq/L   CO2 29 19 - 32 mEq/L   Glucose, Bld 110 (H) 70 - 99 mg/dL   BUN 15 6 - 23 mg/dL   Creatinine, Ser 2.70 0.40 - 1.50 mg/dL   Total Bilirubin 1.6 (H) 0.2 - 1.2 mg/dL   Alkaline Phosphatase 67 39 - 117 U/L   AST 23 0 - 37 U/L   ALT 26 0 - 53 U/L   Total Protein 6.3 6.0 - 8.3 g/dL   Albumin 4.2 3.5 - 5.2 g/dL   Calcium 9.5 8.4 - 62.3  mg/dL   GFR 762.83 >15.17 mL/min      Assessment & Plan:   Problem List Items Addressed This Visit    Retinitis pigmentosa    Appreciate ophtho care of patient.      Hereditary hemochromatosis    Continue monthly blood draws per GI. Pt will check with GI about frequency of blood draws.       Advanced care planning/counseling discussion    Advanced directive - has not set up. would want wife to be HCPOA. Received advanced directive packet last week      Health maintenance examination - Primary    Preventative protocols reviewed and updated unless pt declined. Discussed healthy diet and lifestyle.       Muscle cramping    Pickle juice helps. Suggested magnesium OTC or coq10 OTC. Discussed possible trial tonic water nightly but rec against Rx quinine.           Follow up plan: Return in about 1 year (around  11/04/2016), or as needed, for annual exam, prior fasting for blood work.  Eustaquio BoydenJavier Kameah Rawl, MD

## 2015-11-27 ENCOUNTER — Telehealth: Payer: Self-pay | Admitting: Gastroenterology

## 2015-11-27 NOTE — Telephone Encounter (Signed)
Has hemochromatosis and was getting phlebotomy every 4 weeks.  He was told he could go to every 3 months but Dr Sharen HonesGutierrez states he preferred he remain on the every 4 week schedule.  Dr Russella DarStark please advise

## 2015-11-29 NOTE — Telephone Encounter (Signed)
After reviewing agree with staying on the 4 week schedule for now.  Will decrease frequency of phlebotomies as iron stores are further reduced.

## 2015-11-30 NOTE — Telephone Encounter (Signed)
Pt has been notified of Dr Ardell IsaacsStark's recommendations, he will call as needed.

## 2015-12-03 ENCOUNTER — Other Ambulatory Visit (INDEPENDENT_AMBULATORY_CARE_PROVIDER_SITE_OTHER): Payer: Medicare Other

## 2015-12-03 LAB — CBC WITH DIFFERENTIAL/PLATELET
BASOS ABS: 0 10*3/uL (ref 0.0–0.1)
Basophils Relative: 0.6 % (ref 0.0–3.0)
EOS ABS: 0.2 10*3/uL (ref 0.0–0.7)
Eosinophils Relative: 2 % (ref 0.0–5.0)
HEMATOCRIT: 43.6 % (ref 39.0–52.0)
HEMOGLOBIN: 15 g/dL (ref 13.0–17.0)
LYMPHS PCT: 29.8 % (ref 12.0–46.0)
Lymphs Abs: 2.3 10*3/uL (ref 0.7–4.0)
MCHC: 34.4 g/dL (ref 30.0–36.0)
MCV: 94 fl (ref 78.0–100.0)
Monocytes Absolute: 0.3 10*3/uL (ref 0.1–1.0)
Monocytes Relative: 4.4 % (ref 3.0–12.0)
Neutro Abs: 4.9 10*3/uL (ref 1.4–7.7)
Neutrophils Relative %: 63.2 % (ref 43.0–77.0)
PLATELETS: 226 10*3/uL (ref 150.0–400.0)
RBC: 4.64 Mil/uL (ref 4.22–5.81)
RDW: 13.1 % (ref 11.5–15.5)
WBC: 7.8 10*3/uL (ref 4.0–10.5)

## 2015-12-03 LAB — IBC PANEL
IRON: 181 ug/dL — AB (ref 42–165)
Saturation Ratios: 82.3 % — ABNORMAL HIGH (ref 20.0–50.0)
TRANSFERRIN: 157 mg/dL — AB (ref 212.0–360.0)

## 2015-12-03 LAB — FERRITIN: FERRITIN: 453 ng/mL — AB (ref 22.0–322.0)

## 2016-01-11 ENCOUNTER — Other Ambulatory Visit (INDEPENDENT_AMBULATORY_CARE_PROVIDER_SITE_OTHER): Payer: Medicare Other

## 2016-01-11 LAB — CBC WITH DIFFERENTIAL/PLATELET
BASOS PCT: 0.5 % (ref 0.0–3.0)
Basophils Absolute: 0 10*3/uL (ref 0.0–0.1)
EOS ABS: 0.1 10*3/uL (ref 0.0–0.7)
Eosinophils Relative: 2.2 % (ref 0.0–5.0)
HEMATOCRIT: 43.4 % (ref 39.0–52.0)
HEMOGLOBIN: 14.7 g/dL (ref 13.0–17.0)
LYMPHS PCT: 34.9 % (ref 12.0–46.0)
Lymphs Abs: 2.1 10*3/uL (ref 0.7–4.0)
MCHC: 33.8 g/dL (ref 30.0–36.0)
MCV: 95.5 fl (ref 78.0–100.0)
MONO ABS: 0.4 10*3/uL (ref 0.1–1.0)
Monocytes Relative: 5.9 % (ref 3.0–12.0)
Neutro Abs: 3.4 10*3/uL (ref 1.4–7.7)
Neutrophils Relative %: 56.5 % (ref 43.0–77.0)
Platelets: 239 10*3/uL (ref 150.0–400.0)
RBC: 4.54 Mil/uL (ref 4.22–5.81)
RDW: 13.8 % (ref 11.5–15.5)
WBC: 6.1 10*3/uL (ref 4.0–10.5)

## 2016-01-11 LAB — FERRITIN: FERRITIN: 557.5 ng/mL — AB (ref 22.0–322.0)

## 2016-01-11 LAB — IBC PANEL
IRON: 177 ug/dL — AB (ref 42–165)
SATURATION RATIOS: 85.4 % — AB (ref 20.0–50.0)
TRANSFERRIN: 148 mg/dL — AB (ref 212.0–360.0)

## 2016-03-10 ENCOUNTER — Other Ambulatory Visit (INDEPENDENT_AMBULATORY_CARE_PROVIDER_SITE_OTHER): Payer: Medicare Other

## 2016-03-10 LAB — CBC WITH DIFFERENTIAL/PLATELET
BASOS ABS: 0 10*3/uL (ref 0.0–0.1)
Basophils Relative: 0.4 % (ref 0.0–3.0)
Eosinophils Absolute: 0.1 10*3/uL (ref 0.0–0.7)
Eosinophils Relative: 1.5 % (ref 0.0–5.0)
HCT: 42.8 % (ref 39.0–52.0)
Hemoglobin: 15 g/dL (ref 13.0–17.0)
LYMPHS ABS: 2.8 10*3/uL (ref 0.7–4.0)
Lymphocytes Relative: 29 % (ref 12.0–46.0)
MCHC: 35 g/dL (ref 30.0–36.0)
MCV: 93.5 fl (ref 78.0–100.0)
MONOS PCT: 5.6 % (ref 3.0–12.0)
Monocytes Absolute: 0.5 10*3/uL (ref 0.1–1.0)
NEUTROS ABS: 6.1 10*3/uL (ref 1.4–7.7)
NEUTROS PCT: 63.5 % (ref 43.0–77.0)
PLATELETS: 241 10*3/uL (ref 150.0–400.0)
RBC: 4.57 Mil/uL (ref 4.22–5.81)
RDW: 13.2 % (ref 11.5–15.5)
WBC: 9.6 10*3/uL (ref 4.0–10.5)

## 2016-03-11 ENCOUNTER — Other Ambulatory Visit: Payer: Self-pay

## 2016-05-13 ENCOUNTER — Other Ambulatory Visit (INDEPENDENT_AMBULATORY_CARE_PROVIDER_SITE_OTHER): Payer: Medicare Other

## 2016-05-13 LAB — CBC WITH DIFFERENTIAL/PLATELET
BASOS PCT: 0.6 % (ref 0.0–3.0)
Basophils Absolute: 0.1 10*3/uL (ref 0.0–0.1)
EOS PCT: 1.1 % (ref 0.0–5.0)
Eosinophils Absolute: 0.1 10*3/uL (ref 0.0–0.7)
HCT: 44.6 % (ref 39.0–52.0)
Hemoglobin: 15.5 g/dL (ref 13.0–17.0)
LYMPHS ABS: 2.5 10*3/uL (ref 0.7–4.0)
Lymphocytes Relative: 27.1 % (ref 12.0–46.0)
MCHC: 34.7 g/dL (ref 30.0–36.0)
MCV: 93.3 fl (ref 78.0–100.0)
MONO ABS: 0.5 10*3/uL (ref 0.1–1.0)
Monocytes Relative: 5.4 % (ref 3.0–12.0)
NEUTROS ABS: 6.1 10*3/uL (ref 1.4–7.7)
NEUTROS PCT: 65.8 % (ref 43.0–77.0)
Platelets: 259 10*3/uL (ref 150.0–400.0)
RBC: 4.78 Mil/uL (ref 4.22–5.81)
RDW: 13.3 % (ref 11.5–15.5)
WBC: 9.3 10*3/uL (ref 4.0–10.5)

## 2016-06-14 ENCOUNTER — Other Ambulatory Visit (INDEPENDENT_AMBULATORY_CARE_PROVIDER_SITE_OTHER): Payer: Medicare Other

## 2016-06-14 LAB — CBC WITH DIFFERENTIAL/PLATELET
BASOS ABS: 0 10*3/uL (ref 0.0–0.1)
Basophils Relative: 0.5 % (ref 0.0–3.0)
EOS ABS: 0.2 10*3/uL (ref 0.0–0.7)
EOS PCT: 1.9 % (ref 0.0–5.0)
HCT: 44.7 % (ref 39.0–52.0)
HEMOGLOBIN: 16 g/dL (ref 13.0–17.0)
LYMPHS ABS: 2.8 10*3/uL (ref 0.7–4.0)
Lymphocytes Relative: 33.6 % (ref 12.0–46.0)
MCHC: 35.7 g/dL (ref 30.0–36.0)
MCV: 93.2 fl (ref 78.0–100.0)
MONO ABS: 0.6 10*3/uL (ref 0.1–1.0)
Monocytes Relative: 6.8 % (ref 3.0–12.0)
NEUTROS PCT: 57.2 % (ref 43.0–77.0)
Neutro Abs: 4.8 10*3/uL (ref 1.4–7.7)
Platelets: 252 10*3/uL (ref 150.0–400.0)
RBC: 4.8 Mil/uL (ref 4.22–5.81)
RDW: 13.2 % (ref 11.5–15.5)
WBC: 8.4 10*3/uL (ref 4.0–10.5)

## 2016-06-14 LAB — FERRITIN: FERRITIN: 546.1 ng/mL — AB (ref 22.0–322.0)

## 2016-06-16 LAB — IBC PANEL
IRON: 220 ug/dL — AB (ref 42–165)
SATURATION RATIOS: 96.4 % — AB (ref 20.0–50.0)
TRANSFERRIN: 163 mg/dL — AB (ref 212.0–360.0)

## 2016-06-20 ENCOUNTER — Other Ambulatory Visit: Payer: Self-pay

## 2016-07-19 ENCOUNTER — Encounter (INDEPENDENT_AMBULATORY_CARE_PROVIDER_SITE_OTHER): Payer: Medicare Other | Admitting: Ophthalmology

## 2016-07-19 DIAGNOSIS — H43813 Vitreous degeneration, bilateral: Secondary | ICD-10-CM | POA: Diagnosis not present

## 2016-07-19 DIAGNOSIS — H2703 Aphakia, bilateral: Secondary | ICD-10-CM | POA: Diagnosis not present

## 2016-07-19 DIAGNOSIS — H3552 Pigmentary retinal dystrophy: Secondary | ICD-10-CM | POA: Diagnosis not present

## 2016-08-09 ENCOUNTER — Other Ambulatory Visit (INDEPENDENT_AMBULATORY_CARE_PROVIDER_SITE_OTHER): Payer: Medicare Other

## 2016-08-09 LAB — CBC WITH DIFFERENTIAL/PLATELET
BASOS ABS: 0.1 10*3/uL (ref 0.0–0.1)
Basophils Relative: 0.8 % (ref 0.0–3.0)
EOS ABS: 0.2 10*3/uL (ref 0.0–0.7)
Eosinophils Relative: 2.4 % (ref 0.0–5.0)
HCT: 41.1 % (ref 39.0–52.0)
HEMOGLOBIN: 14.6 g/dL (ref 13.0–17.0)
Lymphocytes Relative: 31.5 % (ref 12.0–46.0)
Lymphs Abs: 2.9 10*3/uL (ref 0.7–4.0)
MCHC: 35.4 g/dL (ref 30.0–36.0)
MCV: 92.5 fl (ref 78.0–100.0)
MONO ABS: 0.5 10*3/uL (ref 0.1–1.0)
Monocytes Relative: 5.6 % (ref 3.0–12.0)
Neutro Abs: 5.5 10*3/uL (ref 1.4–7.7)
Neutrophils Relative %: 59.7 % (ref 43.0–77.0)
Platelets: 318 10*3/uL (ref 150.0–400.0)
RBC: 4.45 Mil/uL (ref 4.22–5.81)
RDW: 13.2 % (ref 11.5–15.5)
WBC: 9.2 10*3/uL (ref 4.0–10.5)

## 2016-08-10 ENCOUNTER — Other Ambulatory Visit: Payer: Self-pay

## 2016-09-12 ENCOUNTER — Other Ambulatory Visit (INDEPENDENT_AMBULATORY_CARE_PROVIDER_SITE_OTHER): Payer: Medicare Other

## 2016-09-12 LAB — IBC PANEL
IRON: 172 ug/dL — AB (ref 42–165)
SATURATION RATIOS: 76.8 % — AB (ref 20.0–50.0)
TRANSFERRIN: 160 mg/dL — AB (ref 212.0–360.0)

## 2016-09-12 LAB — CBC WITH DIFFERENTIAL/PLATELET
BASOS ABS: 0.1 10*3/uL (ref 0.0–0.1)
Basophils Relative: 0.8 % (ref 0.0–3.0)
EOS ABS: 0.2 10*3/uL (ref 0.0–0.7)
Eosinophils Relative: 2.8 % (ref 0.0–5.0)
HEMATOCRIT: 43.5 % (ref 39.0–52.0)
HEMOGLOBIN: 15.3 g/dL (ref 13.0–17.0)
LYMPHS PCT: 33.6 % (ref 12.0–46.0)
Lymphs Abs: 2.4 10*3/uL (ref 0.7–4.0)
MCHC: 35.1 g/dL (ref 30.0–36.0)
MCV: 93.6 fl (ref 78.0–100.0)
MONO ABS: 0.4 10*3/uL (ref 0.1–1.0)
Monocytes Relative: 5.9 % (ref 3.0–12.0)
Neutro Abs: 4 10*3/uL (ref 1.4–7.7)
Neutrophils Relative %: 56.9 % (ref 43.0–77.0)
Platelets: 236 10*3/uL (ref 150.0–400.0)
RBC: 4.65 Mil/uL (ref 4.22–5.81)
RDW: 13.7 % (ref 11.5–15.5)
WBC: 7.1 10*3/uL (ref 4.0–10.5)

## 2016-09-12 LAB — FERRITIN: FERRITIN: 427 ng/mL — AB (ref 22.0–322.0)

## 2016-11-02 ENCOUNTER — Other Ambulatory Visit: Payer: Self-pay | Admitting: Family Medicine

## 2016-11-02 DIAGNOSIS — Z125 Encounter for screening for malignant neoplasm of prostate: Secondary | ICD-10-CM

## 2016-11-02 DIAGNOSIS — E785 Hyperlipidemia, unspecified: Secondary | ICD-10-CM | POA: Insufficient documentation

## 2016-11-04 ENCOUNTER — Other Ambulatory Visit (INDEPENDENT_AMBULATORY_CARE_PROVIDER_SITE_OTHER): Payer: Medicare Other

## 2016-11-04 DIAGNOSIS — E785 Hyperlipidemia, unspecified: Secondary | ICD-10-CM

## 2016-11-04 DIAGNOSIS — Z125 Encounter for screening for malignant neoplasm of prostate: Secondary | ICD-10-CM | POA: Diagnosis not present

## 2016-11-04 LAB — IBC PANEL
Iron: 200 ug/dL — ABNORMAL HIGH (ref 42–165)
Saturation Ratios: 98.5 % — ABNORMAL HIGH (ref 20.0–50.0)
Transferrin: 145 mg/dL — ABNORMAL LOW (ref 212.0–360.0)

## 2016-11-04 LAB — LIPID PANEL
CHOLESTEROL: 105 mg/dL (ref 0–200)
HDL: 45.1 mg/dL (ref >39.00)
LDL Cholesterol: 51 mg/dL (ref 0–99)
NONHDL: 59.42
Total CHOL/HDL Ratio: 2
Triglycerides: 42 mg/dL (ref 0.0–149.0)
VLDL: 8.4 mg/dL (ref 0.0–40.0)

## 2016-11-04 LAB — CBC WITH DIFFERENTIAL/PLATELET
BASOS PCT: 0.8 % (ref 0.0–3.0)
Basophils Absolute: 0.1 10*3/uL (ref 0.0–0.1)
EOS ABS: 0.2 10*3/uL (ref 0.0–0.7)
EOS PCT: 2.6 % (ref 0.0–5.0)
HCT: 44.2 % (ref 39.0–52.0)
Hemoglobin: 15.5 g/dL (ref 13.0–17.0)
Lymphocytes Relative: 38 % (ref 12.0–46.0)
Lymphs Abs: 2.6 10*3/uL (ref 0.7–4.0)
MCHC: 35.1 g/dL (ref 30.0–36.0)
MCV: 93.9 fl (ref 78.0–100.0)
Monocytes Absolute: 0.5 10*3/uL (ref 0.1–1.0)
Monocytes Relative: 7.5 % (ref 3.0–12.0)
NEUTROS PCT: 51.1 % (ref 43.0–77.0)
Neutro Abs: 3.4 10*3/uL (ref 1.4–7.7)
Platelets: 240 10*3/uL (ref 150.0–400.0)
RBC: 4.71 Mil/uL (ref 4.22–5.81)
RDW: 13.6 % (ref 11.5–15.5)
WBC: 6.7 10*3/uL (ref 4.0–10.5)

## 2016-11-04 LAB — COMPREHENSIVE METABOLIC PANEL
ALK PHOS: 55 U/L (ref 39–117)
ALT: 34 U/L (ref 0–53)
AST: 25 U/L (ref 0–37)
Albumin: 4.2 g/dL (ref 3.5–5.2)
BUN: 18 mg/dL (ref 6–23)
CHLORIDE: 102 meq/L (ref 96–112)
CO2: 31 mEq/L (ref 19–32)
CREATININE: 0.84 mg/dL (ref 0.40–1.50)
Calcium: 9.2 mg/dL (ref 8.4–10.5)
GFR: 102.17 mL/min (ref >60.00)
GLUCOSE: 100 mg/dL — AB (ref 70–99)
POTASSIUM: 4.5 meq/L (ref 3.5–5.1)
SODIUM: 138 meq/L (ref 135–145)
TOTAL PROTEIN: 6.1 g/dL (ref 6.0–8.3)
Total Bilirubin: 2.2 mg/dL — ABNORMAL HIGH (ref 0.2–1.2)

## 2016-11-04 LAB — FERRITIN: FERRITIN: 415.8 ng/mL — AB (ref 22.0–322.0)

## 2016-11-04 LAB — PSA: PSA: 0.16 ng/mL (ref 0.10–4.00)

## 2016-11-10 ENCOUNTER — Ambulatory Visit (INDEPENDENT_AMBULATORY_CARE_PROVIDER_SITE_OTHER): Payer: Medicare Other | Admitting: Family Medicine

## 2016-11-10 ENCOUNTER — Encounter: Payer: Self-pay | Admitting: Family Medicine

## 2016-11-10 VITALS — BP 122/70 | HR 80 | Temp 97.7°F | Wt 174.8 lb

## 2016-11-10 DIAGNOSIS — Z0001 Encounter for general adult medical examination with abnormal findings: Secondary | ICD-10-CM | POA: Diagnosis not present

## 2016-11-10 DIAGNOSIS — R252 Cramp and spasm: Secondary | ICD-10-CM

## 2016-11-10 DIAGNOSIS — E785 Hyperlipidemia, unspecified: Secondary | ICD-10-CM | POA: Diagnosis not present

## 2016-11-10 DIAGNOSIS — H3552 Pigmentary retinal dystrophy: Secondary | ICD-10-CM | POA: Diagnosis not present

## 2016-11-10 DIAGNOSIS — Z0000 Encounter for general adult medical examination without abnormal findings: Secondary | ICD-10-CM

## 2016-11-10 NOTE — Assessment & Plan Note (Addendum)
No longer drives (since 2000) Appreciate ophtho care of patient.

## 2016-11-10 NOTE — Assessment & Plan Note (Signed)
Told could not donate blood as it is classified as a "disease".

## 2016-11-10 NOTE — Progress Notes (Signed)
BP 122/70   Pulse 80   Temp 97.7 F (36.5 C) (Oral)   Wt 174 lb 12 oz (79.3 kg)   SpO2 98%   BMI 25.07 kg/m    CC: CPE Subjective:    Patient ID: Michael Vaughan, male    DOB: January 28, 1966, 51 y.o.   MRN: [id number]  HPI: Michael Vaughan is a 51 y.o. male presenting on 11/10/2016 for Annual Exam   H/o retinitis pigmentosa - he no longer drives (since 2000).  Iron overload - from hemochromatosis.  Ongoing cramping of legs at night time.   Preventative: Colon cancer screening - discussed. He wants to schedule but difficulty with getting wife to drive him.  Prostate cancer screening - brother developed prostate cancer at age 33 yo. Discussed. Would like to continue screening regularly. 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  Advanced directive - has not set up. would want wife to be HCPOA. Received advanced directive packet last week Seat belt use discussed Sunscreen use discussed. No changing moles on skin. Ex smoker - quit 2005 Alcohol - occasional  Caffeine: 4-6 cups coffee  Lives with wife, 1 son, other grown children, dog and cows  Occupation: on disability for vision loss, legally blind, raises cows  Edu: HS  Activity: raises cows, some hunting/fishing  Diet: good water, daily fruits/vegetables   Relevant past medical, surgical, family and social history reviewed and updated as indicated. Interim medical history since our last visit reviewed. Allergies and medications reviewed and updated. Outpatient Medications Prior to Visit  Medication Sig Dispense Refill  . pilocarpine (PILOCAR) 1 % ophthalmic solution Place 1 drop into the left eye 2 (two) times daily.     . fluticasone (FLONASE) 50 MCG/ACT nasal spray Place 2 sprays into both nostrils daily. (Patient not taking: Reported on 11/10/2016) 16 g 3  . pseudoephedrine-guaifenesin (MUCINEX D) 60-600 MG 12 hr tablet Take 1 tablet by mouth 2  (two) times daily as needed for congestion.     No facility-administered medications prior to visit.      Per HPI unless specifically indicated in ROS section below Review of Systems  Constitutional: Negative for activity change, appetite change, chills, fatigue, fever and unexpected weight change.  HENT: Negative for hearing loss.   Eyes: Negative for visual disturbance.  Respiratory: Negative for cough, chest tightness, shortness of breath and wheezing.   Cardiovascular: Negative for chest pain, palpitations and leg swelling.  Gastrointestinal: Negative for abdominal distention, abdominal pain, blood in stool, constipation, diarrhea, nausea and vomiting.  Genitourinary: Negative for difficulty urinating and hematuria.  Musculoskeletal: Negative for arthralgias, myalgias and neck pain.  Skin: Negative for rash.  Neurological: Negative for dizziness, seizures, syncope and headaches.  Hematological: Negative for adenopathy. Does not bruise/bleed easily.  Psychiatric/Behavioral: Negative for dysphoric mood. The patient is not nervous/anxious.        Objective:    BP 122/70   Pulse 80   Temp 97.7 F (36.5 C) (Oral)   Wt 174 lb 12 oz (79.3 kg)   SpO2 98%   BMI 25.07 kg/m   Wt Readings from Last 3 Encounters:  11/10/16 174 lb 12 oz (79.3 kg)  11/05/15 176 lb 8 oz (80.1 kg)  10/27/15 170 lb (77.1 kg)    Physical Exam  Constitutional: He is oriented to person, place, and time. He appears well-developed and well-nourished. No distress.  HENT:  Head: Normocephalic and atraumatic.  Right Ear:  Hearing, tympanic membrane, external ear and ear canal normal.  Left Ear: Hearing, tympanic membrane, external ear and ear canal normal.  Nose: Nose normal.  Mouth/Throat: Uvula is midline, oropharynx is clear and moist and mucous membranes are normal. No oropharyngeal exudate, posterior oropharyngeal edema or posterior oropharyngeal erythema.  Eyes: Conjunctivae and EOM are normal. Pupils are  equal, round, and reactive to light. No scleral icterus.  Neck: Normal range of motion. Neck supple. No thyromegaly present.  Cardiovascular: Normal rate, regular rhythm, normal heart sounds and intact distal pulses.   No murmur heard. Pulses:      Radial pulses are 2+ on the right side, and 2+ on the left side.  Pulmonary/Chest: Effort normal and breath sounds normal. No respiratory distress. He has no wheezes. He has no rales.  Abdominal: Soft. Bowel sounds are normal. He exhibits no distension and no mass. There is no tenderness. There is no rebound and no guarding.  Genitourinary: Prostate normal. Rectal exam shows no external hemorrhoid, no internal hemorrhoid, no fissure, no mass, no tenderness and anal tone normal. Prostate is not enlarged (20gm) and not tender.  Musculoskeletal: Normal range of motion. He exhibits no edema.  Lymphadenopathy:    He has no cervical adenopathy.  Neurological: He is alert and oriented to person, place, and time.  CN grossly intact, station and gait intact  Skin: Skin is warm and dry. No rash noted.  Psychiatric: He has a normal mood and affect. His behavior is normal. Judgment and thought content normal.  Nursing note and vitals reviewed.  Results for orders placed or performed in visit on 11/04/16  PSA  Result Value Ref Range   PSA 0.16 0.10 - 4.00 ng/mL  Lipid panel  Result Value Ref Range   Cholesterol 105 0 - 200 mg/dL   Triglycerides 78.242.0 0.0 - 149.0 mg/dL   HDL 95.6245.10 >13.08>39.00 mg/dL   VLDL 8.4 0.0 - 65.740.0 mg/dL   LDL Cholesterol 51 0 - 99 mg/dL   Total CHOL/HDL Ratio 2    NonHDL 59.42   Comprehensive metabolic panel  Result Value Ref Range   Sodium 138 135 - 145 mEq/L   Potassium 4.5 3.5 - 5.1 mEq/L   Chloride 102 96 - 112 mEq/L   CO2 31 19 - 32 mEq/L   Glucose, Bld 100 (H) 70 - 99 mg/dL   BUN 18 6 - 23 mg/dL   Creatinine, Ser 8.460.84 0.40 - 1.50 mg/dL   Total Bilirubin 2.2 (H) 0.2 - 1.2 mg/dL   Alkaline Phosphatase 55 39 - 117 U/L   AST  25 0 - 37 U/L   ALT 34 0 - 53 U/L   Total Protein 6.1 6.0 - 8.3 g/dL   Albumin 4.2 3.5 - 5.2 g/dL   Calcium 9.2 8.4 - 96.210.5 mg/dL   GFR 952.84102.17 >13.24>60.00 mL/min  CBC with Differential/Platelet  Result Value Ref Range   WBC 6.7 4.0 - 10.5 K/uL   RBC 4.71 4.22 - 5.81 Mil/uL   Hemoglobin 15.5 13.0 - 17.0 g/dL   HCT 40.144.2 02.739.0 - 25.352.0 %   MCV 93.9 78.0 - 100.0 fl   MCHC 35.1 30.0 - 36.0 g/dL   RDW 66.413.6 40.311.5 - 47.415.5 %   Platelets 240.0 150.0 - 400.0 K/uL   Neutrophils Relative % 51.1 43.0 - 77.0 %   Lymphocytes Relative 38.0 12.0 - 46.0 %   Monocytes Relative 7.5 3.0 - 12.0 %   Eosinophils Relative 2.6 0.0 - 5.0 %   Basophils  Relative 0.8 0.0 - 3.0 %   Neutro Abs 3.4 1.4 - 7.7 K/uL   Lymphs Abs 2.6 0.7 - 4.0 K/uL   Monocytes Absolute 0.5 0.1 - 1.0 K/uL   Eosinophils Absolute 0.2 0.0 - 0.7 K/uL   Basophils Absolute 0.1 0.0 - 0.1 K/uL  Ferritin  Result Value Ref Range   Ferritin 415.8 (H) 22.0 - 322.0 ng/mL  IBC panel  Result Value Ref Range   Iron 200 (H) 42 - 165 ug/dL   Transferrin 161.0 (L) 212.0 - 360.0 mg/dL   Saturation Ratios 96.0 (H) 20.0 - 50.0 %      Assessment & Plan:   Problem List Items Addressed This Visit    Dyslipidemia    Significant improvement off meds - continue to monitor.      Health maintenance examination - Primary    Preventative protocols reviewed and updated unless pt declined. Discussed healthy diet and lifestyle.       Hereditary hemochromatosis (HCC)    Told could not donate blood as it is classified as a "disease".       Iron overload    Continue f/u with GI for routine blood draws.       Muscle cramping    Ongoing. Discussed tonic water, discussed soap bar under bedsheet trial. Consider Mg check next labs. K levels normal. CoQ10 did not help.       Retinitis pigmentosa    No longer drives (since 2000) Appreciate ophtho care of patient.          Follow up plan: Return in about 1 year (around 11/10/2017) for annual exam, prior  fasting for blood work.  Eustaquio Boyden, MD

## 2016-11-10 NOTE — Assessment & Plan Note (Signed)
Significant improvement off meds - continue to monitor.

## 2016-11-10 NOTE — Assessment & Plan Note (Signed)
Continue f/u with GI for routine blood draws.

## 2016-11-10 NOTE — Assessment & Plan Note (Signed)
Ongoing. Discussed tonic water, discussed soap bar under bedsheet trial. Consider Mg check next labs. K levels normal. CoQ10 did not help.

## 2016-11-10 NOTE — Patient Instructions (Addendum)
Call Dr Fuller Plan to schedule colonoscopy this summer. If not done, let me know for stool kit.  You are doing well today. Return as needed or in 1 year for next physical.   Health Maintenance, Male A healthy lifestyle and preventive care is important for your health and wellness. Ask your health care provider about what schedule of regular examinations is right for you. What should I know about weight and diet? Eat a Healthy Diet  Eat plenty of vegetables, fruits, whole grains, low-fat dairy products, and lean protein.  Do not eat a lot of foods high in solid fats, added sugars, or salt.  Maintain a Healthy Weight Regular exercise can help you achieve or maintain a healthy weight. You should:  Do at least 150 minutes of exercise each week. The exercise should increase your heart rate and make you sweat (moderate-intensity exercise).  Do strength-training exercises at least twice a week.  Watch Your Levels of Cholesterol and Blood Lipids  Have your blood tested for lipids and cholesterol every 5 years starting at 52 years of age. If you are at high risk for heart disease, you should start having your blood tested when you are 52 years old. You may need to have your cholesterol levels checked more often if: ? Your lipid or cholesterol levels are high. ? You are older than 52 years of age. ? You are at high risk for heart disease.  What should I know about cancer screening? Many types of cancers can be detected early and may often be prevented. Lung Cancer  You should be screened every year for lung cancer if: ? You are a current smoker who has smoked for at least 30 years. ? You are a former smoker who has quit within the past 15 years.  Talk to your health care provider about your screening options, when you should start screening, and how often you should be screened.  Colorectal Cancer  Routine colorectal cancer screening usually begins at 52 years of age and should be repeated  every 5-10 years until you are 52 years old. You may need to be screened more often if early forms of precancerous polyps or small growths are found. Your health care provider may recommend screening at an earlier age if you have risk factors for colon cancer.  Your health care provider may recommend using home test kits to check for hidden blood in the stool.  A small camera at the end of a tube can be used to examine your colon (sigmoidoscopy or colonoscopy). This checks for the earliest forms of colorectal cancer.  Prostate and Testicular Cancer  Depending on your age and overall health, your health care provider may do certain tests to screen for prostate and testicular cancer.  Talk to your health care provider about any symptoms or concerns you have about testicular or prostate cancer.  Skin Cancer  Check your skin from head to toe regularly.  Tell your health care provider about any new moles or changes in moles, especially if: ? There is a change in a mole's size, shape, or color. ? You have a mole that is larger than a pencil eraser.  Always use sunscreen. Apply sunscreen liberally and repeat throughout the day.  Protect yourself by wearing long sleeves, pants, a wide-brimmed hat, and sunglasses when outside.  What should I know about heart disease, diabetes, and high blood pressure?  If you are 28-67 years of age, have your blood pressure checked every 3-5 years.  If you are 43 years of age or older, have your blood pressure checked every year. You should have your blood pressure measured twice-once when you are at a hospital or clinic, and once when you are not at a hospital or clinic. Record the average of the two measurements. To check your blood pressure when you are not at a hospital or clinic, you can use: ? An automated blood pressure machine at a pharmacy. ? A home blood pressure monitor.  Talk to your health care provider about your target blood pressure.  If you are  between 57-74 years old, ask your health care provider if you should take aspirin to prevent heart disease.  Have regular diabetes screenings by checking your fasting blood sugar level. ? If you are at a normal weight and have a low risk for diabetes, have this test once every three years after the age of 77. ? If you are overweight and have a high risk for diabetes, consider being tested at a younger age or more often.  A one-time screening for abdominal aortic aneurysm (AAA) by ultrasound is recommended for men aged 44-75 years who are current or former smokers. What should I know about preventing infection? Hepatitis B If you have a higher risk for hepatitis B, you should be screened for this virus. Talk with your health care provider to find out if you are at risk for hepatitis B infection. Hepatitis C Blood testing is recommended for:  Everyone born from [birth year] through 1966.  Anyone with known risk factors for hepatitis C.  Sexually Transmitted Diseases (STDs)  You should be screened each year for STDs including gonorrhea and chlamydia if: ? You are sexually active and are younger than 52 years of age. ? You are older than 52 years of age and your health care provider tells you that you are at risk for this type of infection. ? Your sexual activity has changed since you were last screened and you are at an increased risk for chlamydia or gonorrhea. Ask your health care provider if you are at risk.  Talk with your health care provider about whether you are at high risk of being infected with HIV. Your health care provider may recommend a prescription medicine to help prevent HIV infection.  What else can I do?  Schedule regular health, dental, and eye exams.  Stay current with your vaccines (immunizations).  Do not use any tobacco products, such as cigarettes, chewing tobacco, and e-cigarettes. If you need help quitting, ask your health care provider.  Limit alcohol intake to no  more than 2 drinks per day. One drink equals 12 ounces of beer, 5 ounces of wine, or 1 ounces of hard liquor.  Do not use street drugs.  Do not share needles.  Ask your health care provider for help if you need support or information about quitting drugs.  Tell your health care provider if you often feel depressed.  Tell your health care provider if you have ever been abused or do not feel safe at home. This information is not intended to replace advice given to you by your health care provider. Make sure you discuss any questions you have with your health care provider. Document Released: 11/26/2007 Document Revised: 01/27/2016 Document Reviewed: 03/03/2015 Elsevier Interactive Patient Education  [employer].

## 2016-11-10 NOTE — Assessment & Plan Note (Signed)
Preventative protocols reviewed and updated unless pt declined. Discussed healthy diet and lifestyle.  

## 2016-11-22 ENCOUNTER — Other Ambulatory Visit (INDEPENDENT_AMBULATORY_CARE_PROVIDER_SITE_OTHER): Payer: Medicare Other

## 2016-11-22 ENCOUNTER — Other Ambulatory Visit: Payer: Self-pay

## 2016-11-22 LAB — CBC WITH DIFFERENTIAL/PLATELET
BASOS ABS: 0 10*3/uL (ref 0.0–0.1)
BASOS PCT: 0.7 % (ref 0.0–3.0)
EOS ABS: 0.2 10*3/uL (ref 0.0–0.7)
Eosinophils Relative: 2.6 % (ref 0.0–5.0)
HCT: 43.5 % (ref 39.0–52.0)
Hemoglobin: 15.4 g/dL (ref 13.0–17.0)
Lymphocytes Relative: 28.8 % (ref 12.0–46.0)
Lymphs Abs: 2 10*3/uL (ref 0.7–4.0)
MCHC: 35.4 g/dL (ref 30.0–36.0)
MCV: 93.2 fl (ref 78.0–100.0)
Monocytes Absolute: 0.5 10*3/uL (ref 0.1–1.0)
Monocytes Relative: 6.5 % (ref 3.0–12.0)
NEUTROS PCT: 61.4 % (ref 43.0–77.0)
Neutro Abs: 4.3 10*3/uL (ref 1.4–7.7)
PLATELETS: 237 10*3/uL (ref 150.0–400.0)
RBC: 4.66 Mil/uL (ref 4.22–5.81)
RDW: 13.3 % (ref 11.5–15.5)
WBC: 7.1 10*3/uL (ref 4.0–10.5)

## 2016-12-11 HISTORY — PX: COLONOSCOPY: SHX174

## 2016-12-22 ENCOUNTER — Ambulatory Visit (AMBULATORY_SURGERY_CENTER): Payer: Self-pay

## 2016-12-22 ENCOUNTER — Other Ambulatory Visit (INDEPENDENT_AMBULATORY_CARE_PROVIDER_SITE_OTHER): Payer: Medicare Other

## 2016-12-22 VITALS — Ht 70.0 in | Wt 177.4 lb

## 2016-12-22 DIAGNOSIS — Z1211 Encounter for screening for malignant neoplasm of colon: Secondary | ICD-10-CM

## 2016-12-22 LAB — CBC WITH DIFFERENTIAL/PLATELET
BASOS PCT: 1 % (ref 0.0–3.0)
Basophils Absolute: 0.1 10*3/uL (ref 0.0–0.1)
EOS ABS: 0.1 10*3/uL (ref 0.0–0.7)
Eosinophils Relative: 1.6 % (ref 0.0–5.0)
HCT: 43.3 % (ref 39.0–52.0)
HEMOGLOBIN: 15.6 g/dL (ref 13.0–17.0)
Lymphocytes Relative: 29.8 % (ref 12.0–46.0)
Lymphs Abs: 2.6 10*3/uL (ref 0.7–4.0)
MCHC: 36 g/dL (ref 30.0–36.0)
MCV: 93.8 fl (ref 78.0–100.0)
MONO ABS: 0.5 10*3/uL (ref 0.1–1.0)
Monocytes Relative: 5.3 % (ref 3.0–12.0)
Neutro Abs: 5.4 10*3/uL (ref 1.4–7.7)
Neutrophils Relative %: 62.3 % (ref 43.0–77.0)
Platelets: 257 10*3/uL (ref 150.0–400.0)
RBC: 4.62 Mil/uL (ref 4.22–5.81)
RDW: 13.9 % (ref 11.5–15.5)
WBC: 8.6 10*3/uL (ref 4.0–10.5)

## 2016-12-22 MED ORDER — NA SULFATE-K SULFATE-MG SULF 17.5-3.13-1.6 GM/177ML PO SOLN: 1.0000 | Freq: Once | ORAL | 0 refills | Status: AC

## 2016-12-22 NOTE — Progress Notes (Signed)
Denies allergies to eggs or soy products. Denies complication of anesthesia or sedation. Denies use of weight loss medication. Denies use of O2.   Emmi instructions given for colonoscopy.  

## 2016-12-29 ENCOUNTER — Encounter: Payer: Self-pay | Admitting: Gastroenterology

## 2017-01-06 ENCOUNTER — Encounter: Payer: Self-pay | Admitting: Gastroenterology

## 2017-01-06 ENCOUNTER — Ambulatory Visit (AMBULATORY_SURGERY_CENTER): Payer: Medicare Other | Admitting: Gastroenterology

## 2017-01-06 VITALS — BP 143/83 | HR 61 | Temp 98.2°F | Resp 26 | Ht 70.0 in | Wt 177.0 lb

## 2017-01-06 DIAGNOSIS — Z1211 Encounter for screening for malignant neoplasm of colon: Secondary | ICD-10-CM | POA: Diagnosis not present

## 2017-01-06 DIAGNOSIS — Z1212 Encounter for screening for malignant neoplasm of rectum: Secondary | ICD-10-CM

## 2017-01-06 MED ORDER — SODIUM CHLORIDE 0.9 % IV SOLN: 500.0000 mL | INTRAVENOUS | Status: AC

## 2017-01-06 NOTE — Op Note (Signed)
[facility] Patient Name: Michael Vaughan Procedure Date: 01/06/2017 8:32 AM MRN: [id number] Endoscopist: Meryl Dare , MD Age: 51 Referring MD:  Date of Birth: [birth year] Gender: Male Account #: [id number] Procedure:                Colonoscopy Indications:              Screening for colorectal malignant neoplasm Medicines:                Monitored Anesthesia Care Procedure:                Pre-Anesthesia Assessment:                           - Prior to the procedure, a History and Physical                            was performed, and patient medications and                            allergies were reviewed. The patient's tolerance of                            previous anesthesia was also reviewed. The risks                            and benefits of the procedure and the sedation                            options and risks were discussed with the patient.                            All questions were answered, and informed consent                            was obtained. Prior Anticoagulants: The patient has                            taken no previous anticoagulant or antiplatelet                            agents. ASA Grade Assessment: II - A patient with                            mild systemic disease. After reviewing the risks                            and benefits, the patient was deemed in                            satisfactory condition to undergo the procedure.                           After obtaining informed consent, the colonoscope  was passed under direct vision. Throughout the                            procedure, the patient's blood pressure, pulse, and                            oxygen saturations were monitored continuously. The                            Colonoscope was introduced through the anus and                            advanced to the the cecum, identified by                            appendiceal orifice and  ileocecal valve. The                            ileocecal valve, appendiceal orifice, and rectum                            were photographed. The quality of the bowel                            preparation was excellent. The colonoscopy was                            performed without difficulty. The patient tolerated                            the procedure well. Scope In: 8:41:37 AM Scope Out: 8:53:11 AM Scope Withdrawal Time: 0 hours 9 minutes 6 seconds  Total Procedure Duration: 0 hours 11 minutes 34 seconds  Findings:                 The perianal and digital rectal examinations were                            normal.                           The entire examined colon appeared normal on direct                            and retroflexion views. Complications:            No immediate complications. Estimated blood loss:                            None. Estimated Blood Loss:     Estimated blood loss: none. Impression:               - The entire examined colon is normal on direct and                            retroflexion views.                           -  No specimens collected. Recommendation:           - Repeat colonoscopy in 10 years for screening                            purposes.                           - Patient has a contact number available for                            emergencies. The signs and symptoms of potential                            delayed complications were discussed with the                            patient. Return to normal activities tomorrow.                            Written discharge instructions were provided to the                            patient.                           - Resume previous diet.                           - Continue present medications. Meryl DareMalcolm T Bowen Kia, MD 01/06/2017 8:55:40 AM This report has been signed electronically.

## 2017-01-06 NOTE — Progress Notes (Signed)
To PACU VSS. Report to [email or web address] 

## 2017-01-06 NOTE — Patient Instructions (Signed)
YOU HAD AN ENDOSCOPIC PROCEDURE TODAY AT THE [facility]:   Refer to the procedure report that was given to you for any specific questions about what was found during the examination.  If the procedure report does not answer your questions, please call your gastroenterologist to clarify.  If you requested that your care partner not be given the details of your procedure findings, then the procedure report has been included in a sealed envelope for you to review at your convenience later.  YOU SHOULD EXPECT: Some feelings of bloating in the abdomen. Passage of more gas than usual.  Walking can help get rid of the air that was put into your GI tract during the procedure and reduce the bloating. If you had a lower endoscopy (such as a colonoscopy or flexible sigmoidoscopy) you may notice spotting of blood in your stool or on the toilet paper. If you underwent a bowel prep for your procedure, you may not have a normal bowel movement for a few days.  Please Note:  You might notice some irritation and congestion in your nose or some drainage.  This is from the oxygen used during your procedure.  There is no need for concern and it should clear up in a day or so.  SYMPTOMS TO REPORT IMMEDIATELY:   Following lower endoscopy (colonoscopy or flexible sigmoidoscopy):  Excessive amounts of blood in the stool  Significant tenderness or worsening of abdominal pains  Swelling of the abdomen that is new, acute  Fever of 100F or higher  For urgent or emergent issues, a gastroenterologist can be reached at any hour by calling [phone].   DIET:  We do recommend a small meal at first, but then you may proceed to your regular diet.  Drink plenty of fluids but you should avoid alcoholic beverages for 24 hours.  ACTIVITY:  You should plan to take it easy for the rest of today and you should NOT DRIVE or use heavy machinery until tomorrow (because of the sedation medicines used during the test).     FOLLOW UP: Our staff will call the number listed on your records the next business day following your procedure to check on you and address any questions or concerns that you may have regarding the information given to you following your procedure. If we do not reach you, we will leave a message.  However, if you are feeling well and you are not experiencing any problems, there is no need to return our call.  We will assume that you have returned to your regular daily activities without incident.  If any biopsies were taken you will be contacted by phone or by letter within the next 1-3 weeks.  Please call us at [phone] if you have not heard about the biopsies in 3 weeks.    SIGNATURES/CONFIDENTIALITY: You and/or your care partner have signed paperwork which will be entered into your electronic medical record.  These signatures attest to the fact that that the information above on your After Visit Summary has been reviewed and is understood.  Full responsibility of the confidentiality of this discharge information lies with you and/or your care-partner. 

## 2017-01-09 ENCOUNTER — Telehealth: Payer: Self-pay

## 2017-01-09 NOTE — Telephone Encounter (Signed)
  Follow up Call-  Call back number 01/06/2017  Post procedure Call Back phone  # [phone]  Permission to leave phone message Yes  Some recent data might be hidden     Patient questions:  Do you have a fever, pain , or abdominal swelling? No. Pain Score  0 *  Have you tolerated food without any problems? Yes.    Have you been able to return to your normal activities? Yes.    Do you have any questions about your discharge instructions: Diet   No. Medications  No. Follow up visit  No.  Do you have questions or concerns about your Care? No.  Actions: * If pain score is 4 or above: No action needed, pain <4.

## 2017-02-07 ENCOUNTER — Other Ambulatory Visit (INDEPENDENT_AMBULATORY_CARE_PROVIDER_SITE_OTHER): Payer: Medicare Other

## 2017-02-07 LAB — CBC WITH DIFFERENTIAL/PLATELET
BASOS ABS: 0.1 10*3/uL (ref 0.0–0.1)
Basophils Relative: 0.7 % (ref 0.0–3.0)
EOS ABS: 0.1 10*3/uL (ref 0.0–0.7)
Eosinophils Relative: 1.7 % (ref 0.0–5.0)
HCT: 44.8 % (ref 39.0–52.0)
Hemoglobin: 15.6 g/dL (ref 13.0–17.0)
LYMPHS ABS: 2.1 10*3/uL (ref 0.7–4.0)
Lymphocytes Relative: 25.7 % (ref 12.0–46.0)
MCHC: 34.7 g/dL (ref 30.0–36.0)
MCV: 95.6 fl (ref 78.0–100.0)
Monocytes Absolute: 0.5 10*3/uL (ref 0.1–1.0)
Monocytes Relative: 6.3 % (ref 3.0–12.0)
NEUTROS PCT: 65.6 % (ref 43.0–77.0)
Neutro Abs: 5.4 10*3/uL (ref 1.4–7.7)
PLATELETS: 273 10*3/uL (ref 150.0–400.0)
RBC: 4.69 Mil/uL (ref 4.22–5.81)
RDW: 13.4 % (ref 11.5–15.5)
WBC: 8.2 10*3/uL (ref 4.0–10.5)

## 2017-02-07 LAB — IBC PANEL
Iron: 184 ug/dL — ABNORMAL HIGH (ref 42–165)
Saturation Ratios: 89.4 % — ABNORMAL HIGH (ref 20.0–50.0)
Transferrin: 147 mg/dL — ABNORMAL LOW (ref 212.0–360.0)

## 2017-02-07 LAB — FERRITIN: Ferritin: 440.2 ng/mL — ABNORMAL HIGH (ref 22.0–322.0)

## 2017-03-09 ENCOUNTER — Other Ambulatory Visit (INDEPENDENT_AMBULATORY_CARE_PROVIDER_SITE_OTHER): Payer: Medicare Other

## 2017-03-09 LAB — CBC WITH DIFFERENTIAL/PLATELET
BASOS PCT: 0.9 % (ref 0.0–3.0)
Basophils Absolute: 0.1 10*3/uL (ref 0.0–0.1)
EOS PCT: 1.3 % (ref 0.0–5.0)
Eosinophils Absolute: 0.1 10*3/uL (ref 0.0–0.7)
HCT: 44.7 % (ref 39.0–52.0)
Hemoglobin: 15.4 g/dL (ref 13.0–17.0)
LYMPHS ABS: 1.9 10*3/uL (ref 0.7–4.0)
Lymphocytes Relative: 25.6 % (ref 12.0–46.0)
MCHC: 34.4 g/dL (ref 30.0–36.0)
MCV: 96.2 fl (ref 78.0–100.0)
Monocytes Absolute: 0.5 10*3/uL (ref 0.1–1.0)
Monocytes Relative: 7.2 % (ref 3.0–12.0)
Neutro Abs: 4.8 10*3/uL (ref 1.4–7.7)
Neutrophils Relative %: 65 % (ref 43.0–77.0)
PLATELETS: 239 10*3/uL (ref 150.0–400.0)
RBC: 4.65 Mil/uL (ref 4.22–5.81)
RDW: 13.2 % (ref 11.5–15.5)
WBC: 7.4 10*3/uL (ref 4.0–10.5)

## 2017-05-18 ENCOUNTER — Other Ambulatory Visit (INDEPENDENT_AMBULATORY_CARE_PROVIDER_SITE_OTHER): Payer: Medicare Other

## 2017-05-18 LAB — CBC WITH DIFFERENTIAL/PLATELET
BASOS PCT: 0.8 % (ref 0.0–3.0)
Basophils Absolute: 0.1 10*3/uL (ref 0.0–0.1)
EOS ABS: 0.2 10*3/uL (ref 0.0–0.7)
Eosinophils Relative: 2.4 % (ref 0.0–5.0)
HCT: 43.5 % (ref 39.0–52.0)
HEMOGLOBIN: 14.9 g/dL (ref 13.0–17.0)
LYMPHS ABS: 2.2 10*3/uL (ref 0.7–4.0)
Lymphocytes Relative: 33.7 % (ref 12.0–46.0)
MCHC: 34.3 g/dL (ref 30.0–36.0)
MCV: 95.1 fl (ref 78.0–100.0)
MONO ABS: 0.5 10*3/uL (ref 0.1–1.0)
Monocytes Relative: 7 % (ref 3.0–12.0)
NEUTROS PCT: 56.1 % (ref 43.0–77.0)
Neutro Abs: 3.7 10*3/uL (ref 1.4–7.7)
Platelets: 245 10*3/uL (ref 150.0–400.0)
RBC: 4.58 Mil/uL (ref 4.22–5.81)
RDW: 13.3 % (ref 11.5–15.5)
WBC: 6.5 10*3/uL (ref 4.0–10.5)

## 2017-06-16 ENCOUNTER — Other Ambulatory Visit (INDEPENDENT_AMBULATORY_CARE_PROVIDER_SITE_OTHER): Payer: Medicare Other

## 2017-06-16 ENCOUNTER — Other Ambulatory Visit: Payer: Self-pay | Admitting: Emergency Medicine

## 2017-06-16 LAB — CBC WITH DIFFERENTIAL/PLATELET
BASOS ABS: 0.1 10*3/uL (ref 0.0–0.1)
BASOS PCT: 1 % (ref 0.0–3.0)
EOS ABS: 0.1 10*3/uL (ref 0.0–0.7)
Eosinophils Relative: 2.1 % (ref 0.0–5.0)
HEMATOCRIT: 42.9 % (ref 39.0–52.0)
Hemoglobin: 14.8 g/dL (ref 13.0–17.0)
LYMPHS ABS: 2.1 10*3/uL (ref 0.7–4.0)
LYMPHS PCT: 30 % (ref 12.0–46.0)
MCHC: 34.5 g/dL (ref 30.0–36.0)
MCV: 96.2 fl (ref 78.0–100.0)
MONOS PCT: 7.3 % (ref 3.0–12.0)
Monocytes Absolute: 0.5 10*3/uL (ref 0.1–1.0)
NEUTROS ABS: 4.1 10*3/uL (ref 1.4–7.7)
NEUTROS PCT: 59.6 % (ref 43.0–77.0)
PLATELETS: 231 10*3/uL (ref 150.0–400.0)
RBC: 4.46 Mil/uL (ref 4.22–5.81)
RDW: 13.9 % (ref 11.5–15.5)
WBC: 6.9 10*3/uL (ref 4.0–10.5)

## 2017-06-16 LAB — IBC PANEL
IRON: 197 ug/dL — AB (ref 42–165)
Saturation Ratios: 85.3 % — ABNORMAL HIGH (ref 20.0–50.0)
TRANSFERRIN: 165 mg/dL — AB (ref 212.0–360.0)

## 2017-06-20 ENCOUNTER — Other Ambulatory Visit: Payer: Self-pay

## 2017-07-11 ENCOUNTER — Other Ambulatory Visit (INDEPENDENT_AMBULATORY_CARE_PROVIDER_SITE_OTHER): Payer: Medicare Other

## 2017-07-11 ENCOUNTER — Other Ambulatory Visit: Payer: Self-pay

## 2017-07-11 LAB — CBC WITH DIFFERENTIAL/PLATELET
BASOS PCT: 1 % (ref 0.0–3.0)
Basophils Absolute: 0.1 10*3/uL (ref 0.0–0.1)
Eosinophils Absolute: 0.1 10*3/uL (ref 0.0–0.7)
Eosinophils Relative: 2.1 % (ref 0.0–5.0)
HEMATOCRIT: 41.2 % (ref 39.0–52.0)
Hemoglobin: 14.3 g/dL (ref 13.0–17.0)
LYMPHS PCT: 41.2 % (ref 12.0–46.0)
Lymphs Abs: 2.7 10*3/uL (ref 0.7–4.0)
MCHC: 34.8 g/dL (ref 30.0–36.0)
MCV: 94.7 fl (ref 78.0–100.0)
MONOS PCT: 7.9 % (ref 3.0–12.0)
Monocytes Absolute: 0.5 10*3/uL (ref 0.1–1.0)
NEUTROS ABS: 3.2 10*3/uL (ref 1.4–7.7)
Neutrophils Relative %: 47.8 % (ref 43.0–77.0)
PLATELETS: 235 10*3/uL (ref 150.0–400.0)
RBC: 4.35 Mil/uL (ref 4.22–5.81)
RDW: 13.8 % (ref 11.5–15.5)
WBC: 6.7 10*3/uL (ref 4.0–10.5)

## 2017-07-19 ENCOUNTER — Ambulatory Visit (INDEPENDENT_AMBULATORY_CARE_PROVIDER_SITE_OTHER): Payer: Medicare Other | Admitting: Ophthalmology

## 2017-07-19 DIAGNOSIS — H3552 Pigmentary retinal dystrophy: Secondary | ICD-10-CM | POA: Diagnosis not present

## 2017-08-10 ENCOUNTER — Other Ambulatory Visit (INDEPENDENT_AMBULATORY_CARE_PROVIDER_SITE_OTHER): Payer: Medicare Other

## 2017-08-10 LAB — CBC WITH DIFFERENTIAL/PLATELET
BASOS ABS: 0 10*3/uL (ref 0.0–0.1)
Basophils Relative: 0.7 % (ref 0.0–3.0)
EOS ABS: 0.1 10*3/uL (ref 0.0–0.7)
Eosinophils Relative: 1.9 % (ref 0.0–5.0)
HEMATOCRIT: 42.8 % (ref 39.0–52.0)
Hemoglobin: 15 g/dL (ref 13.0–17.0)
LYMPHS ABS: 1.9 10*3/uL (ref 0.7–4.0)
LYMPHS PCT: 29.7 % (ref 12.0–46.0)
MCHC: 35.1 g/dL (ref 30.0–36.0)
MCV: 95.6 fl (ref 78.0–100.0)
Monocytes Absolute: 0.4 10*3/uL (ref 0.1–1.0)
Monocytes Relative: 5.9 % (ref 3.0–12.0)
NEUTROS PCT: 61.8 % (ref 43.0–77.0)
Neutro Abs: 4 10*3/uL (ref 1.4–7.7)
Platelets: 230 10*3/uL (ref 150.0–400.0)
RBC: 4.47 Mil/uL (ref 4.22–5.81)
RDW: 13.9 % (ref 11.5–15.5)
WBC: 6.4 10*3/uL (ref 4.0–10.5)

## 2017-09-04 ENCOUNTER — Other Ambulatory Visit (INDEPENDENT_AMBULATORY_CARE_PROVIDER_SITE_OTHER): Payer: Medicare Other

## 2017-09-04 LAB — CBC WITH DIFFERENTIAL/PLATELET
Basophils Absolute: 0 K/uL (ref 0.0–0.1)
Basophils Relative: 0.8 % (ref 0.0–3.0)
Eosinophils Absolute: 0.1 K/uL (ref 0.0–0.7)
Eosinophils Relative: 1.8 % (ref 0.0–5.0)
HCT: 42.2 % (ref 39.0–52.0)
Hemoglobin: 15 g/dL (ref 13.0–17.0)
Lymphocytes Relative: 34.5 % (ref 12.0–46.0)
Lymphs Abs: 2 K/uL (ref 0.7–4.0)
MCHC: 35.4 g/dL (ref 30.0–36.0)
MCV: 94.9 fl (ref 78.0–100.0)
Monocytes Absolute: 0.3 K/uL (ref 0.1–1.0)
Monocytes Relative: 5.7 % (ref 3.0–12.0)
Neutro Abs: 3.3 K/uL (ref 1.4–7.7)
Neutrophils Relative %: 57.2 % (ref 43.0–77.0)
Platelets: 232 K/uL (ref 150.0–400.0)
RBC: 4.45 Mil/uL (ref 4.22–5.81)
RDW: 13.6 % (ref 11.5–15.5)
WBC: 5.8 K/uL (ref 4.0–10.5)

## 2017-10-09 ENCOUNTER — Other Ambulatory Visit (INDEPENDENT_AMBULATORY_CARE_PROVIDER_SITE_OTHER): Payer: Medicare Other

## 2017-10-09 LAB — CBC WITH DIFFERENTIAL/PLATELET
BASOS ABS: 0 10*3/uL (ref 0.0–0.1)
Basophils Relative: 0.6 % (ref 0.0–3.0)
Eosinophils Absolute: 0.1 10*3/uL (ref 0.0–0.7)
Eosinophils Relative: 0.9 % (ref 0.0–5.0)
HEMATOCRIT: 40.3 % (ref 39.0–52.0)
HEMOGLOBIN: 14.4 g/dL (ref 13.0–17.0)
LYMPHS PCT: 23.9 % (ref 12.0–46.0)
Lymphs Abs: 1.8 10*3/uL (ref 0.7–4.0)
MCHC: 35.7 g/dL (ref 30.0–36.0)
MCV: 94.6 fl (ref 78.0–100.0)
MONOS PCT: 4.4 % (ref 3.0–12.0)
Monocytes Absolute: 0.3 10*3/uL (ref 0.1–1.0)
NEUTROS ABS: 5.4 10*3/uL (ref 1.4–7.7)
Neutrophils Relative %: 70.2 % (ref 43.0–77.0)
PLATELETS: 262 10*3/uL (ref 150.0–400.0)
RBC: 4.26 Mil/uL (ref 4.22–5.81)
RDW: 13.6 % (ref 11.5–15.5)
WBC: 7.7 10*3/uL (ref 4.0–10.5)

## 2017-10-30 ENCOUNTER — Encounter: Payer: Self-pay | Admitting: Family Medicine

## 2017-10-30 ENCOUNTER — Encounter (INDEPENDENT_AMBULATORY_CARE_PROVIDER_SITE_OTHER): Payer: Self-pay

## 2017-11-10 ENCOUNTER — Other Ambulatory Visit (INDEPENDENT_AMBULATORY_CARE_PROVIDER_SITE_OTHER): Payer: Medicare Other

## 2017-11-10 LAB — CBC WITH DIFFERENTIAL/PLATELET
BASOS ABS: 0.1 10*3/uL (ref 0.0–0.1)
Basophils Relative: 0.7 % (ref 0.0–3.0)
EOS ABS: 0.2 10*3/uL (ref 0.0–0.7)
Eosinophils Relative: 2.2 % (ref 0.0–5.0)
HEMATOCRIT: 42.4 % (ref 39.0–52.0)
Hemoglobin: 14.8 g/dL (ref 13.0–17.0)
LYMPHS PCT: 33 % (ref 12.0–46.0)
Lymphs Abs: 2.3 10*3/uL (ref 0.7–4.0)
MCHC: 35 g/dL (ref 30.0–36.0)
MCV: 95.4 fl (ref 78.0–100.0)
MONO ABS: 0.5 10*3/uL (ref 0.1–1.0)
Monocytes Relative: 7.1 % (ref 3.0–12.0)
NEUTROS ABS: 4 10*3/uL (ref 1.4–7.7)
NEUTROS PCT: 57 % (ref 43.0–77.0)
PLATELETS: 255 10*3/uL (ref 150.0–400.0)
RBC: 4.45 Mil/uL (ref 4.22–5.81)
RDW: 13.2 % (ref 11.5–15.5)
WBC: 7 10*3/uL (ref 4.0–10.5)

## 2017-11-10 LAB — FERRITIN: Ferritin: 151.7 ng/mL (ref 22.0–322.0)

## 2017-11-13 ENCOUNTER — Other Ambulatory Visit: Payer: Self-pay

## 2017-11-20 ENCOUNTER — Other Ambulatory Visit: Payer: Self-pay | Admitting: Family Medicine

## 2017-11-20 DIAGNOSIS — Z125 Encounter for screening for malignant neoplasm of prostate: Secondary | ICD-10-CM

## 2017-11-20 DIAGNOSIS — E785 Hyperlipidemia, unspecified: Secondary | ICD-10-CM

## 2017-11-23 ENCOUNTER — Other Ambulatory Visit (INDEPENDENT_AMBULATORY_CARE_PROVIDER_SITE_OTHER): Payer: Medicare Other

## 2017-11-23 DIAGNOSIS — E785 Hyperlipidemia, unspecified: Secondary | ICD-10-CM

## 2017-11-23 DIAGNOSIS — Z125 Encounter for screening for malignant neoplasm of prostate: Secondary | ICD-10-CM | POA: Diagnosis not present

## 2017-11-23 DIAGNOSIS — R7989 Other specified abnormal findings of blood chemistry: Secondary | ICD-10-CM | POA: Diagnosis not present

## 2017-11-23 LAB — COMPREHENSIVE METABOLIC PANEL
ALK PHOS: 52 U/L (ref 39–117)
ALT: 21 U/L (ref 0–53)
AST: 19 U/L (ref 0–37)
Albumin: 4 g/dL (ref 3.5–5.2)
BUN: 21 mg/dL (ref 6–23)
CALCIUM: 9.2 mg/dL (ref 8.4–10.5)
CO2: 30 meq/L (ref 19–32)
Chloride: 104 mEq/L (ref 96–112)
Creatinine, Ser: 0.99 mg/dL (ref 0.40–1.50)
GFR: 84.18 mL/min (ref >60.00)
Glucose, Bld: 96 mg/dL (ref 70–99)
Potassium: 4.5 mEq/L (ref 3.5–5.1)
Sodium: 140 mEq/L (ref 135–145)
TOTAL PROTEIN: 6 g/dL (ref 6.0–8.3)
Total Bilirubin: 1.5 mg/dL — ABNORMAL HIGH (ref 0.2–1.2)

## 2017-11-23 LAB — LIPID PANEL
CHOLESTEROL: 118 mg/dL (ref 0–200)
HDL: 61.7 mg/dL (ref >39.00)
LDL Cholesterol: 46 mg/dL (ref 0–99)
NonHDL: 56.39
TRIGLYCERIDES: 50 mg/dL (ref 0.0–149.0)
Total CHOL/HDL Ratio: 2
VLDL: 10 mg/dL (ref 0.0–40.0)

## 2017-11-23 LAB — PSA: PSA: 0.27 ng/mL (ref 0.10–4.00)

## 2017-11-23 LAB — T4, FREE: FREE T4: 0.77 ng/dL (ref 0.60–1.60)

## 2017-11-23 LAB — TSH: TSH: 5.25 u[IU]/mL — ABNORMAL HIGH (ref 0.35–4.50)

## 2017-11-30 ENCOUNTER — Encounter: Payer: Medicare Other | Admitting: Family Medicine

## 2017-12-06 ENCOUNTER — Encounter: Payer: Self-pay | Admitting: Family Medicine

## 2017-12-06 ENCOUNTER — Ambulatory Visit (INDEPENDENT_AMBULATORY_CARE_PROVIDER_SITE_OTHER): Payer: Medicare Other | Admitting: Family Medicine

## 2017-12-06 ENCOUNTER — Other Ambulatory Visit (INDEPENDENT_AMBULATORY_CARE_PROVIDER_SITE_OTHER): Payer: Medicare Other

## 2017-12-06 VITALS — BP 120/82 | HR 58 | Temp 98.3°F | Ht 68.5 in | Wt 159.8 lb

## 2017-12-06 DIAGNOSIS — E038 Other specified hypothyroidism: Secondary | ICD-10-CM

## 2017-12-06 DIAGNOSIS — Z0000 Encounter for general adult medical examination without abnormal findings: Secondary | ICD-10-CM

## 2017-12-06 DIAGNOSIS — R252 Cramp and spasm: Secondary | ICD-10-CM

## 2017-12-06 DIAGNOSIS — E039 Hypothyroidism, unspecified: Secondary | ICD-10-CM

## 2017-12-06 DIAGNOSIS — M19041 Primary osteoarthritis, right hand: Secondary | ICD-10-CM

## 2017-12-06 DIAGNOSIS — H3552 Pigmentary retinal dystrophy: Secondary | ICD-10-CM

## 2017-12-06 DIAGNOSIS — E785 Hyperlipidemia, unspecified: Secondary | ICD-10-CM

## 2017-12-06 DIAGNOSIS — Z7189 Other specified counseling: Secondary | ICD-10-CM

## 2017-12-06 DIAGNOSIS — M19042 Primary osteoarthritis, left hand: Secondary | ICD-10-CM

## 2017-12-06 LAB — CBC WITH DIFFERENTIAL/PLATELET
Basophils Absolute: 0.1 10*3/uL (ref 0.0–0.1)
Basophils Relative: 1 % (ref 0.0–3.0)
EOS PCT: 2.7 % (ref 0.0–5.0)
Eosinophils Absolute: 0.2 10*3/uL (ref 0.0–0.7)
HEMATOCRIT: 43.2 % (ref 39.0–52.0)
Hemoglobin: 15.3 g/dL (ref 13.0–17.0)
LYMPHS ABS: 2 10*3/uL (ref 0.7–4.0)
Lymphocytes Relative: 32.3 % (ref 12.0–46.0)
MCHC: 35.3 g/dL (ref 30.0–36.0)
MCV: 95.4 fl (ref 78.0–100.0)
MONOS PCT: 6.6 % (ref 3.0–12.0)
Monocytes Absolute: 0.4 10*3/uL (ref 0.1–1.0)
Neutro Abs: 3.5 10*3/uL (ref 1.4–7.7)
Neutrophils Relative %: 57.4 % (ref 43.0–77.0)
PLATELETS: 220 10*3/uL (ref 150.0–400.0)
RBC: 4.53 Mil/uL (ref 4.22–5.81)
RDW: 13.8 % (ref 11.5–15.5)
WBC: 6.2 10*3/uL (ref 4.0–10.5)

## 2017-12-06 NOTE — Assessment & Plan Note (Signed)
Sees retinologist yearly [name]([place]), also [facility] [place]

## 2017-12-06 NOTE — Assessment & Plan Note (Signed)
Preventative protocols reviewed and updated unless pt declined. Discussed healthy diet and lifestyle.  

## 2017-12-06 NOTE — Assessment & Plan Note (Signed)

## 2017-12-06 NOTE — Assessment & Plan Note (Signed)
Advanced directive - has not set up. would want brother and 2 sons to be HCPOA. Packet provided today.

## 2017-12-06 NOTE — Progress Notes (Signed)
BP 120/82 (BP Location: Left Arm, Patient Position: Sitting, Cuff Size: Normal)   Pulse (!) 58   Temp 98.3 F (36.8 C) (Oral)   Ht 5' 8.5" (1.74 m)   Wt 159 lb 12 oz (72.5 kg)   SpO2 98%   BMI 23.94 kg/m    CC: medicare wellness visit Subjective:    Patient ID: Michael Vaughan, male    DOB: 28-Dec-1965, 52 y.o.   MRN: [id number]  HPI: Jahmeek Shirk is a 52 y.o. male presenting on 12/06/2017 for Medicare Wellness   H/o retinitis pigmentosa - he no longer drives (since 2000).  Iron overload - from hemochromatosis.   Ongoing joint pains - predominantly throughout R>L hands - takes aleve twice daily. Joints swell. No redness or warmth. Also has hip and knee pain. No other joints affected. Bottom of feet hurt as well. Mother with RA. Saw Dr Phylliss Bob rheumatologist 20 yrs ago - negative rheumatoid evaluation. Ongoing leg cramping - treats with tonic water and pickle juice.   Wife left him last year 01/2017 - unexpected. 2 months ago she restarted contact with him. Tumultuous relationship, higher stress recently. Trying to work it out with wife. Some ED noted - ?psychological  Preventative: COLONOSCOPY 12/2016 [name]).  Prostate cancer screening - brother developed prostate cancer at age 78 yo. Would like to continue screening regularly.  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  Advanced directive - has not set up. would want brother and 2 sons to be HCPOA. Packet provided today.  Seat belt use discussed.  Sunscreen use discussed. No changing moles on skin. Ex smoker - quit 2005 Alcohol - occasional - at most drinks 4 beers (every 2 weeks) Dentist - not in last several years Eye exam - sees retinologist yearly (retinitis pigmentosa)  Caffeine: 4-6 cups coffee  Lives with wife, 1 son, other grown children, dog and cows  Occupation: on disability for vision loss, legally blind, raises cows  Edu: HS   Activity: raises cows, some hunting/fishing  Diet: good water, daily fruits/vegetables   Relevant past medical, surgical, family and social history reviewed and updated as indicated. Interim medical history since our last visit reviewed. Allergies and medications reviewed and updated. Outpatient Medications Prior to Visit  Medication Sig Dispense Refill  . naproxen sodium (ANAPROX) 220 MG tablet Take 220 mg by mouth 2 (two) times daily with a meal.    . pilocarpine (PILOCAR) 1 % ophthalmic solution Place 1 drop into the left eye 2 (two) times daily.      Facility-Administered Medications Prior to Visit  Medication Dose Route Frequency Provider Last Rate Last Dose  . 0.9 %  sodium chloride infusion  500 mL Intravenous Continuous Meryl Dare, MD         Per HPI unless specifically indicated in ROS section below Review of Systems  Constitutional: Negative for activity change, appetite change, chills, fatigue, fever and unexpected weight change.  HENT: Negative for hearing loss.   Eyes: Negative for visual disturbance.  Respiratory: Negative for cough, chest tightness, shortness of breath and wheezing.   Cardiovascular: Negative for chest pain, palpitations and leg swelling.  Gastrointestinal: Negative for abdominal distention, abdominal pain, blood in stool, constipation, diarrhea, nausea and vomiting.  Genitourinary: Negative for difficulty urinating and hematuria.  Musculoskeletal: Negative for arthralgias, myalgias and neck pain.  Skin: Negative for rash.  Neurological: Negative for dizziness, seizures, syncope and headaches.  Hematological:  Negative for adenopathy. Bruises/bleeds easily.  Psychiatric/Behavioral: Negative for dysphoric mood. The patient is not nervous/anxious.        Objective:    BP 120/82 (BP Location: Left Arm, Patient Position: Sitting, Cuff Size: Normal)   Pulse (!) 58   Temp 98.3 F (36.8 C) (Oral)   Ht 5' 8.5" (1.74 m)   Wt 159 lb 12 oz (72.5 kg)    SpO2 98%   BMI 23.94 kg/m   Wt Readings from Last 3 Encounters:  12/06/17 159 lb 12 oz (72.5 kg)  01/06/17 177 lb (80.3 kg)  12/22/16 177 lb 6.4 oz (80.5 kg)    Physical Exam  Constitutional: He is oriented to person, place, and time. He appears well-developed and well-nourished. No distress.  HENT:  Head: Normocephalic and atraumatic.  Right Ear: Hearing, tympanic membrane, external ear and ear canal normal.  Left Ear: Hearing, tympanic membrane, external ear and ear canal normal.  Nose: Nose normal.  Mouth/Throat: Uvula is midline, oropharynx is clear and moist and mucous membranes are normal. No oropharyngeal exudate, posterior oropharyngeal edema or posterior oropharyngeal erythema.  Eyes: Pupils are equal, round, and reactive to light. Conjunctivae and EOM are normal. No scleral icterus.  Neck: Normal range of motion. Neck supple.  Cardiovascular: Normal rate, regular rhythm, normal heart sounds and intact distal pulses.  No murmur heard. Pulses:      Radial pulses are 2+ on the right side, and 2+ on the left side.  Pulmonary/Chest: Effort normal and breath sounds normal. No respiratory distress. He has no wheezes. He has no rales.  Abdominal: Soft. Bowel sounds are normal. He exhibits no distension and no mass. There is no tenderness. There is no rebound and no guarding.  Musculoskeletal: Normal range of motion. He exhibits no edema.  FROM at elbows. No pain with palpation at wrists, or finger joints (MCPs, IPs). Some swelling at 2nd/3rd MCPs and stiffness with loss of ROM. Pain with flexion of R 2rd MCP and PIP. Firm nodules at IP joints.  FROM bilateral knees  Lymphadenopathy:    He has no cervical adenopathy.  Neurological: He is alert and oriented to person, place, and time.  CN grossly intact, station and gait intact  Skin: Skin is warm and dry. No rash noted.  Hyperpigmentation of skin diffusely (bronze)  Psychiatric: He has a normal mood and affect. His behavior is  normal. Judgment and thought content normal.  Nursing note and vitals reviewed.  Results for orders placed or performed in visit on 12/06/17  CBC with Differential/Platelet  Result Value Ref Range   WBC 6.2 4.0 - 10.5 K/uL   RBC 4.53 4.22 - 5.81 Mil/uL   Hemoglobin 15.3 13.0 - 17.0 g/dL   HCT 16.143.2 09.639.0 - 04.552.0 %   MCV 95.4 78.0 - 100.0 fl   MCHC 35.3 30.0 - 36.0 g/dL   RDW 40.913.8 81.111.5 - 91.415.5 %   Platelets 220.0 150.0 - 400.0 K/uL   Neutrophils Relative % 57.4 43.0 - 77.0 %   Lymphocytes Relative 32.3 12.0 - 46.0 %   Monocytes Relative 6.6 3.0 - 12.0 %   Eosinophils Relative 2.7 0.0 - 5.0 %   Basophils Relative 1.0 0.0 - 3.0 %   Neutro Abs 3.5 1.4 - 7.7 K/uL   Lymphs Abs 2.0 0.7 - 4.0 K/uL   Monocytes Absolute 0.4 0.1 - 1.0 K/uL   Eosinophils Absolute 0.2 0.0 - 0.7 K/uL   Basophils Absolute 0.1 0.0 - 0.1 K/uL   Lab  Results  Component Value Date   CHOL 118 11/23/2017   HDL 61.70 11/23/2017   LDLCALC 46 11/23/2017   TRIG 50.0 11/23/2017   CHOLHDL 2 11/23/2017       Assessment & Plan:   Problem List Items Addressed This Visit    Subclinical hypothyroidism    TSH borderline elevated - denies any hypothyroid symptoms. Longstanding mild constipation (2-3 BM /wk). RTC 3 mo TFTs.       Relevant Orders   TSH   T4, free   Retinitis pigmentosa    Sees retinologist yearly [name]), also sees [facility]      Muscle cramping    Ongoing - treating with tonic water and pickle juice Check Mg next visit.       Relevant Orders   Magnesium   Medicare annual wellness visit, subsequent - Primary    I have personally reviewed the Medicare Annual Wellness questionnaire and have noted 1. The patient's medical and social history 2. Their use of alcohol, tobacco or illicit drugs 3. Their current medications and supplements 4. The patient's functional ability including ADL's, fall risks, home safety risks and hearing or visual impairment. Cognitive function has been assessed and addressed as  indicated.  5. Diet and physical activity 6. Evidence for depression or mood disorders The patients weight, height, BMI have been recorded in the chart. I have made referrals, counseling and provided education to the patient based on review of the above and I have provided the pt with a written personalized care plan for preventive services. Provider list updated.. See scanned questionairre as needed for further documentation. Reviewed preventative protocols and updated unless pt declined.       Iron overload    Routine monthly blood draws through GI.       Hereditary hemochromatosis (HCC)    Routine monthly blood draws through GI [name]).  Will need to discuss DEXA next year eval for OP.      Health maintenance examination    Preventative protocols reviewed and updated unless pt declined. Discussed healthy diet and lifestyle.       Dyslipidemia    Chronic, stable off meds The ASCVD Risk score Denman George DC Jr., et al., 2013) failed to calculate for the following reasons:   The valid total cholesterol range is 130 to 320 mg/dL       Arthropathy of both hands    Chronic, longstanding, predominantly MCP joints.  Anticipate arthralgia related to iron deposition from [facility].  Consider xrays to eval for anticipated chondrocalcinosis Check anti CCP next labwork to r/o RA.  Continue NSAID, consider colchicine.       Relevant Orders   Sedimentation rate   Cyclic citrul peptide antibody, IgG   Advanced care planning/counseling discussion    Advanced directive - has not set up. would want brother and 2 sons to be HCPOA. Packet provided today.           No orders of the defined types were placed in this encounter.  Orders Placed This Encounter  Procedures  . TSH    Standing Status:   Future    Standing Expiration Date:   12/07/2018  . T4, free    Standing Status:   Future    Standing Expiration Date:   12/07/2018  . Magnesium    Standing Status:   Future    Standing Expiration  Date:   12/07/2018  . Sedimentation rate    Standing Status:   Future    Standing Expiration  Date:   12/07/2018  . Cyclic citrul peptide antibody, IgG    Standing Status:   Future    Standing Expiration Date:   12/07/2018    Follow up plan: Return in about 1 year (around 12/07/2018) for annual exam, prior fasting for blood work, medicare wellness visit.  Eustaquio Boyden, MD

## 2017-12-06 NOTE — Assessment & Plan Note (Addendum)
TSH borderline elevated - denies any hypothyroid symptoms. Longstanding mild constipation (2-3 BM /wk). RTC 3 mo TFTs.

## 2017-12-06 NOTE — Patient Instructions (Addendum)
You are doing well today.  Return as needed or in 1 year for next physical. Return in 3 months for lab visit only (thyroid check).  We will also check arthritis labs.   Health Maintenance, Male A healthy lifestyle and preventive care is important for your health and wellness. Ask your health care provider about what schedule of regular examinations is right for you. What should I know about weight and diet? Eat a Healthy Diet  Eat plenty of vegetables, fruits, whole grains, low-fat dairy products, and lean protein.  Do not eat a lot of foods high in solid fats, added sugars, or salt.  Maintain a Healthy Weight Regular exercise can help you achieve or maintain a healthy weight. You should:  Do at least 150 minutes of exercise each week. The exercise should increase your heart rate and make you sweat (moderate-intensity exercise).  Do strength-training exercises at least twice a week.  Watch Your Levels of Cholesterol and Blood Lipids  Have your blood tested for lipids and cholesterol every 5 years starting at 53 years of age. If you are at high risk for heart disease, you should start having your blood tested when you are 53 years old. You may need to have your cholesterol levels checked more often if: ? Your lipid or cholesterol levels are high. ? You are older than 53 years of age. ? You are at high risk for heart disease.  What should I know about cancer screening? Many types of cancers can be detected early and may often be prevented. Lung Cancer  You should be screened every year for lung cancer if: ? You are a current smoker who has smoked for at least 30 years. ? You are a former smoker who has quit within the past 15 years.  Talk to your health care provider about your screening options, when you should start screening, and how often you should be screened.  Colorectal Cancer  Routine colorectal cancer screening usually begins at 53 years of age and should be repeated  every 5-10 years until you are 53 years old. You may need to be screened more often if early forms of precancerous polyps or small growths are found. Your health care provider may recommend screening at an earlier age if you have risk factors for colon cancer.  Your health care provider may recommend using home test kits to check for hidden blood in the stool.  A small camera at the end of a tube can be used to examine your colon (sigmoidoscopy or colonoscopy). This checks for the earliest forms of colorectal cancer.  Prostate and Testicular Cancer  Depending on your age and overall health, your health care provider may do certain tests to screen for prostate and testicular cancer.  Talk to your health care provider about any symptoms or concerns you have about testicular or prostate cancer.  Skin Cancer  Check your skin from head to toe regularly.  Tell your health care provider about any new moles or changes in moles, especially if: ? There is a change in a mole's size, shape, or color. ? You have a mole that is larger than a pencil eraser.  Always use sunscreen. Apply sunscreen liberally and repeat throughout the day.  Protect yourself by wearing long sleeves, pants, a wide-brimmed hat, and sunglasses when outside.  What should I know about heart disease, diabetes, and high blood pressure?  If you are 10518-53 years of age, have your blood pressure checked every 3-5 years.  If you are 43 years of age or older, have your blood pressure checked every year. You should have your blood pressure measured twice-once when you are at a hospital or clinic, and once when you are not at a hospital or clinic. Record the average of the two measurements. To check your blood pressure when you are not at a hospital or clinic, you can use: ? An automated blood pressure machine at a pharmacy. ? A home blood pressure monitor.  Talk to your health care provider about your target blood pressure.  If you are  between 57-74 years old, ask your health care provider if you should take aspirin to prevent heart disease.  Have regular diabetes screenings by checking your fasting blood sugar level. ? If you are at a normal weight and have a low risk for diabetes, have this test once every three years after the age of 77. ? If you are overweight and have a high risk for diabetes, consider being tested at a younger age or more often.  A one-time screening for abdominal aortic aneurysm (AAA) by ultrasound is recommended for men aged 44-75 years who are current or former smokers. What should I know about preventing infection? Hepatitis B If you have a higher risk for hepatitis B, you should be screened for this virus. Talk with your health care provider to find out if you are at risk for hepatitis B infection. Hepatitis C Blood testing is recommended for:  Everyone born from [birth year] through 1966.  Anyone with known risk factors for hepatitis C.  Sexually Transmitted Diseases (STDs)  You should be screened each year for STDs including gonorrhea and chlamydia if: ? You are sexually active and are younger than 53 years of age. ? You are older than 53 years of age and your health care provider tells you that you are at risk for this type of infection. ? Your sexual activity has changed since you were last screened and you are at an increased risk for chlamydia or gonorrhea. Ask your health care provider if you are at risk.  Talk with your health care provider about whether you are at high risk of being infected with HIV. Your health care provider may recommend a prescription medicine to help prevent HIV infection.  What else can I do?  Schedule regular health, dental, and eye exams.  Stay current with your vaccines (immunizations).  Do not use any tobacco products, such as cigarettes, chewing tobacco, and e-cigarettes. If you need help quitting, ask your health care provider.  Limit alcohol intake to no  more than 2 drinks per day. One drink equals 12 ounces of beer, 5 ounces of wine, or 1 ounces of hard liquor.  Do not use street drugs.  Do not share needles.  Ask your health care provider for help if you need support or information about quitting drugs.  Tell your health care provider if you often feel depressed.  Tell your health care provider if you have ever been abused or do not feel safe at home. This information is not intended to replace advice given to you by your health care provider. Make sure you discuss any questions you have with your health care provider. Document Released: 11/26/2007 Document Revised: 01/27/2016 Document Reviewed: 03/03/2015 Elsevier Interactive Patient Education  [employer].

## 2017-12-06 NOTE — Assessment & Plan Note (Signed)
Routine monthly blood draws through GI.

## 2017-12-06 NOTE — Assessment & Plan Note (Addendum)
Chronic, stable off meds The ASCVD Risk score Denman George(Goff DC Jr., et al., 2013) failed to calculate for the following reasons:   The valid total cholesterol range is 130 to 320 mg/dL

## 2017-12-06 NOTE — Assessment & Plan Note (Signed)
Ongoing - treating with tonic water and pickle juice Check Mg next visit.

## 2017-12-06 NOTE — Assessment & Plan Note (Addendum)
Chronic, longstanding, predominantly MCP joints.  An[facility]  Consider xrays to eval for anticipated chondrocalcinosis Check anti CCP next labwork to r/o RA.  Continue NSAID, consider colchicine.

## 2017-12-06 NOTE — Assessment & Plan Note (Addendum)
Routine monthly blood draws through GI [name]([facility]).  Will need to discuss DEXA next year eval for OP.

## 2018-01-05 ENCOUNTER — Other Ambulatory Visit (INDEPENDENT_AMBULATORY_CARE_PROVIDER_SITE_OTHER): Payer: Medicare Other

## 2018-01-05 LAB — CBC WITH DIFFERENTIAL/PLATELET
BASOS ABS: 0 10*3/uL (ref 0.0–0.1)
Basophils Relative: 0.7 % (ref 0.0–3.0)
EOS ABS: 0.2 10*3/uL (ref 0.0–0.7)
EOS PCT: 2.1 % (ref 0.0–5.0)
HCT: 40.3 % (ref 39.0–52.0)
Hemoglobin: 14.4 g/dL (ref 13.0–17.0)
LYMPHS ABS: 2.2 10*3/uL (ref 0.7–4.0)
Lymphocytes Relative: 30.3 % (ref 12.0–46.0)
MCHC: 35.6 g/dL (ref 30.0–36.0)
MCV: 95.1 fl (ref 78.0–100.0)
MONO ABS: 0.4 10*3/uL (ref 0.1–1.0)
Monocytes Relative: 5.5 % (ref 3.0–12.0)
NEUTROS PCT: 61.4 % (ref 43.0–77.0)
Neutro Abs: 4.5 10*3/uL (ref 1.4–7.7)
Platelets: 248 10*3/uL (ref 150.0–400.0)
RBC: 4.24 Mil/uL (ref 4.22–5.81)
RDW: 13.4 % (ref 11.5–15.5)
WBC: 7.3 10*3/uL (ref 4.0–10.5)

## 2018-02-07 ENCOUNTER — Other Ambulatory Visit (INDEPENDENT_AMBULATORY_CARE_PROVIDER_SITE_OTHER): Payer: Medicare Other

## 2018-02-07 LAB — CBC WITH DIFFERENTIAL/PLATELET
Basophils Absolute: 0.1 10*3/uL (ref 0.0–0.1)
Basophils Relative: 1 % (ref 0.0–3.0)
Eosinophils Absolute: 0.2 10*3/uL (ref 0.0–0.7)
Eosinophils Relative: 2.2 % (ref 0.0–5.0)
HCT: 43.4 % (ref 39.0–52.0)
Hemoglobin: 15.3 g/dL (ref 13.0–17.0)
Lymphocytes Relative: 28.9 % (ref 12.0–46.0)
Lymphs Abs: 2 10*3/uL (ref 0.7–4.0)
MCHC: 35.3 g/dL (ref 30.0–36.0)
MCV: 94.7 fl (ref 78.0–100.0)
Monocytes Absolute: 0.5 10*3/uL (ref 0.1–1.0)
Monocytes Relative: 7.1 % (ref 3.0–12.0)
Neutro Abs: 4.2 10*3/uL (ref 1.4–7.7)
Neutrophils Relative %: 60.8 % (ref 43.0–77.0)
Platelets: 249 10*3/uL (ref 150.0–400.0)
RBC: 4.58 Mil/uL (ref 4.22–5.81)
RDW: 13.6 % (ref 11.5–15.5)
WBC: 6.9 10*3/uL (ref 4.0–10.5)

## 2018-03-08 ENCOUNTER — Other Ambulatory Visit: Payer: Medicare Other

## 2018-03-08 ENCOUNTER — Other Ambulatory Visit (INDEPENDENT_AMBULATORY_CARE_PROVIDER_SITE_OTHER): Payer: Medicare Other

## 2018-03-08 DIAGNOSIS — E039 Hypothyroidism, unspecified: Secondary | ICD-10-CM | POA: Diagnosis not present

## 2018-03-08 DIAGNOSIS — M19042 Primary osteoarthritis, left hand: Secondary | ICD-10-CM

## 2018-03-08 DIAGNOSIS — M19041 Primary osteoarthritis, right hand: Secondary | ICD-10-CM | POA: Diagnosis not present

## 2018-03-08 DIAGNOSIS — R252 Cramp and spasm: Secondary | ICD-10-CM

## 2018-03-08 DIAGNOSIS — E038 Other specified hypothyroidism: Secondary | ICD-10-CM

## 2018-03-08 LAB — MAGNESIUM: Magnesium: 1.8 mg/dL (ref 1.5–2.5)

## 2018-03-08 LAB — SEDIMENTATION RATE: Sed Rate: 1 mm/hr (ref 0–20)

## 2018-03-09 LAB — CYCLIC CITRUL PEPTIDE ANTIBODY, IGG: Cyclic Citrullin Peptide Ab: <16 UNITS

## 2018-03-09 LAB — T4, FREE: Free T4: 0.82 ng/dL (ref 0.60–1.60)

## 2018-03-09 LAB — TSH: TSH: 3.48 u[IU]/mL (ref 0.35–4.50)

## 2018-03-16 ENCOUNTER — Other Ambulatory Visit (INDEPENDENT_AMBULATORY_CARE_PROVIDER_SITE_OTHER): Payer: Medicare Other

## 2018-03-16 LAB — CBC WITH DIFFERENTIAL/PLATELET
BASOS ABS: 0.1 10*3/uL (ref 0.0–0.1)
Basophils Relative: 0.7 % (ref 0.0–3.0)
Eosinophils Absolute: 0.2 10*3/uL (ref 0.0–0.7)
Eosinophils Relative: 2.5 % (ref 0.0–5.0)
HCT: 41.7 % (ref 39.0–52.0)
HEMOGLOBIN: 14.8 g/dL (ref 13.0–17.0)
LYMPHS ABS: 2.2 10*3/uL (ref 0.7–4.0)
LYMPHS PCT: 29.2 % (ref 12.0–46.0)
MCHC: 35.5 g/dL (ref 30.0–36.0)
MCV: 94.2 fl (ref 78.0–100.0)
MONOS PCT: 6.6 % (ref 3.0–12.0)
Monocytes Absolute: 0.5 10*3/uL (ref 0.1–1.0)
NEUTROS PCT: 61 % (ref 43.0–77.0)
Neutro Abs: 4.6 10*3/uL (ref 1.4–7.7)
Platelets: 256 10*3/uL (ref 150.0–400.0)
RBC: 4.43 Mil/uL (ref 4.22–5.81)
RDW: 13.2 % (ref 11.5–15.5)
WBC: 7.5 10*3/uL (ref 4.0–10.5)

## 2018-03-22 ENCOUNTER — Other Ambulatory Visit: Payer: Self-pay

## 2018-04-09 ENCOUNTER — Other Ambulatory Visit (INDEPENDENT_AMBULATORY_CARE_PROVIDER_SITE_OTHER): Payer: Medicare Other

## 2018-04-09 LAB — IBC PANEL
Iron: 119 ug/dL (ref 42–165)
Saturation Ratios: 49.4 % (ref 20.0–50.0)
TRANSFERRIN: 172 mg/dL — AB (ref 212.0–360.0)

## 2018-04-09 LAB — CBC WITH DIFFERENTIAL/PLATELET
BASOS PCT: 1.3 % (ref 0.0–3.0)
Basophils Absolute: 0.1 10*3/uL (ref 0.0–0.1)
EOS PCT: 2.8 % (ref 0.0–5.0)
Eosinophils Absolute: 0.2 10*3/uL (ref 0.0–0.7)
HEMATOCRIT: 42.7 % (ref 39.0–52.0)
HEMOGLOBIN: 15.2 g/dL (ref 13.0–17.0)
LYMPHS PCT: 33 % (ref 12.0–46.0)
Lymphs Abs: 2 10*3/uL (ref 0.7–4.0)
MCHC: 35.5 g/dL (ref 30.0–36.0)
MCV: 94.8 fl (ref 78.0–100.0)
MONOS PCT: 7.4 % (ref 3.0–12.0)
Monocytes Absolute: 0.5 10*3/uL (ref 0.1–1.0)
NEUTROS ABS: 3.4 10*3/uL (ref 1.4–7.7)
Neutrophils Relative %: 55.5 % (ref 43.0–77.0)
PLATELETS: 246 10*3/uL (ref 150.0–400.0)
RBC: 4.5 Mil/uL (ref 4.22–5.81)
RDW: 13.3 % (ref 11.5–15.5)
WBC: 6.2 10*3/uL (ref 4.0–10.5)

## 2018-04-09 LAB — FERRITIN: Ferritin: 64.3 ng/mL (ref 22.0–322.0)

## 2018-04-10 ENCOUNTER — Other Ambulatory Visit: Payer: Self-pay

## 2018-05-09 ENCOUNTER — Other Ambulatory Visit (INDEPENDENT_AMBULATORY_CARE_PROVIDER_SITE_OTHER): Payer: Medicare Other

## 2018-05-09 LAB — CBC WITH DIFFERENTIAL/PLATELET
BASOS ABS: 0.1 10*3/uL (ref 0.0–0.1)
Basophils Relative: 0.8 % (ref 0.0–3.0)
EOS PCT: 2.4 % (ref 0.0–5.0)
Eosinophils Absolute: 0.2 10*3/uL (ref 0.0–0.7)
HEMATOCRIT: 43.1 % (ref 39.0–52.0)
Hemoglobin: 15.1 g/dL (ref 13.0–17.0)
LYMPHS PCT: 29.3 % (ref 12.0–46.0)
Lymphs Abs: 2.1 10*3/uL (ref 0.7–4.0)
MCHC: 35.1 g/dL (ref 30.0–36.0)
MCV: 95.2 fl (ref 78.0–100.0)
MONOS PCT: 6.6 % (ref 3.0–12.0)
Monocytes Absolute: 0.5 10*3/uL (ref 0.1–1.0)
NEUTROS ABS: 4.4 10*3/uL (ref 1.4–7.7)
Neutrophils Relative %: 60.9 % (ref 43.0–77.0)
PLATELETS: 238 10*3/uL (ref 150.0–400.0)
RBC: 4.53 Mil/uL (ref 4.22–5.81)
RDW: 13.1 % (ref 11.5–15.5)
WBC: 7.2 10*3/uL (ref 4.0–10.5)

## 2018-05-09 LAB — IBC PANEL
Iron: 183 ug/dL — ABNORMAL HIGH (ref 42–165)
Saturation Ratios: 85.4 % — ABNORMAL HIGH (ref 20.0–50.0)
Transferrin: 153 mg/dL — ABNORMAL LOW (ref 212.0–360.0)

## 2018-05-09 LAB — FERRITIN: Ferritin: 41.7 ng/mL (ref 22.0–322.0)

## 2018-06-11 ENCOUNTER — Other Ambulatory Visit (INDEPENDENT_AMBULATORY_CARE_PROVIDER_SITE_OTHER): Payer: Medicare Other

## 2018-06-11 LAB — CBC WITH DIFFERENTIAL/PLATELET
BASOS ABS: 0.1 10*3/uL (ref 0.0–0.1)
Basophils Relative: 0.9 % (ref 0.0–3.0)
Eosinophils Absolute: 0.2 10*3/uL (ref 0.0–0.7)
Eosinophils Relative: 2 % (ref 0.0–5.0)
HEMATOCRIT: 45.5 % (ref 39.0–52.0)
Hemoglobin: 15.9 g/dL (ref 13.0–17.0)
LYMPHS PCT: 25.1 % (ref 12.0–46.0)
Lymphs Abs: 1.9 10*3/uL (ref 0.7–4.0)
MCHC: 34.9 g/dL (ref 30.0–36.0)
MCV: 94.7 fl (ref 78.0–100.0)
MONOS PCT: 6.9 % (ref 3.0–12.0)
Monocytes Absolute: 0.5 10*3/uL (ref 0.1–1.0)
NEUTROS PCT: 65.1 % (ref 43.0–77.0)
Neutro Abs: 5 10*3/uL (ref 1.4–7.7)
Platelets: 245 10*3/uL (ref 150.0–400.0)
RBC: 4.8 Mil/uL (ref 4.22–5.81)
RDW: 13.3 % (ref 11.5–15.5)
WBC: 7.6 10*3/uL (ref 4.0–10.5)

## 2018-07-10 ENCOUNTER — Other Ambulatory Visit (INDEPENDENT_AMBULATORY_CARE_PROVIDER_SITE_OTHER): Payer: Medicare Other

## 2018-07-10 LAB — CBC WITH DIFFERENTIAL/PLATELET
BASOS ABS: 0.1 10*3/uL (ref 0.0–0.1)
Basophils Relative: 1.4 % (ref 0.0–3.0)
EOS ABS: 0.2 10*3/uL (ref 0.0–0.7)
Eosinophils Relative: 3.1 % (ref 0.0–5.0)
HCT: 41.4 % (ref 39.0–52.0)
Hemoglobin: 14.6 g/dL (ref 13.0–17.0)
LYMPHS ABS: 2.1 10*3/uL (ref 0.7–4.0)
LYMPHS PCT: 30.7 % (ref 12.0–46.0)
MCHC: 35.2 g/dL (ref 30.0–36.0)
MCV: 93.8 fl (ref 78.0–100.0)
Monocytes Absolute: 0.4 10*3/uL (ref 0.1–1.0)
Monocytes Relative: 5.8 % (ref 3.0–12.0)
NEUTROS ABS: 4.1 10*3/uL (ref 1.4–7.7)
NEUTROS PCT: 59 % (ref 43.0–77.0)
PLATELETS: 249 10*3/uL (ref 150.0–400.0)
RBC: 4.41 Mil/uL (ref 4.22–5.81)
RDW: 13 % (ref 11.5–15.5)
WBC: 6.9 10*3/uL (ref 4.0–10.5)

## 2018-07-12 ENCOUNTER — Other Ambulatory Visit: Payer: Self-pay

## 2018-07-19 ENCOUNTER — Encounter (INDEPENDENT_AMBULATORY_CARE_PROVIDER_SITE_OTHER): Payer: Medicare Other | Admitting: Ophthalmology

## 2018-07-19 DIAGNOSIS — H3552 Pigmentary retinal dystrophy: Secondary | ICD-10-CM | POA: Diagnosis not present

## 2018-08-10 ENCOUNTER — Other Ambulatory Visit (INDEPENDENT_AMBULATORY_CARE_PROVIDER_SITE_OTHER): Payer: Medicare Other

## 2018-08-10 ENCOUNTER — Other Ambulatory Visit: Payer: Self-pay

## 2018-08-10 LAB — CBC WITH DIFFERENTIAL/PLATELET
Basophils Absolute: 0.1 10*3/uL (ref 0.0–0.1)
Basophils Relative: 1 % (ref 0.0–3.0)
Eosinophils Absolute: 0.2 10*3/uL (ref 0.0–0.7)
Eosinophils Relative: 3.4 % (ref 0.0–5.0)
HCT: 41.8 % (ref 39.0–52.0)
Hemoglobin: 14.6 g/dL (ref 13.0–17.0)
Lymphocytes Relative: 37.2 % (ref 12.0–46.0)
Lymphs Abs: 2.4 10*3/uL (ref 0.7–4.0)
MCHC: 34.9 g/dL (ref 30.0–36.0)
MCV: 92.3 fl (ref 78.0–100.0)
MONO ABS: 0.5 10*3/uL (ref 0.1–1.0)
Monocytes Relative: 7.5 % (ref 3.0–12.0)
Neutro Abs: 3.2 10*3/uL (ref 1.4–7.7)
Neutrophils Relative %: 50.9 % (ref 43.0–77.0)
Platelets: 251 10*3/uL (ref 150.0–400.0)
RBC: 4.53 Mil/uL (ref 4.22–5.81)
RDW: 13.2 % (ref 11.5–15.5)
WBC: 6.3 10*3/uL (ref 4.0–10.5)

## 2018-08-10 LAB — IBC PANEL
Iron: 97 ug/dL (ref 42–165)
Saturation Ratios: 35.9 % (ref 20.0–50.0)
Transferrin: 193 mg/dL — ABNORMAL LOW (ref 212.0–360.0)

## 2018-08-10 LAB — FERRITIN: Ferritin: 16.8 ng/mL — ABNORMAL LOW (ref 22.0–322.0)

## 2018-10-17 ENCOUNTER — Other Ambulatory Visit: Payer: Self-pay | Admitting: Family Medicine

## 2018-10-17 ENCOUNTER — Encounter: Payer: Self-pay | Admitting: Family Medicine

## 2018-10-17 ENCOUNTER — Ambulatory Visit (INDEPENDENT_AMBULATORY_CARE_PROVIDER_SITE_OTHER): Payer: Medicare Other | Admitting: Family Medicine

## 2018-10-17 VITALS — Ht 68.5 in | Wt 175.0 lb

## 2018-10-17 DIAGNOSIS — M545 Low back pain, unspecified: Secondary | ICD-10-CM

## 2018-10-17 LAB — POCT URINALYSIS DIPSTICK
Bilirubin, UA: NEGATIVE
Blood, UA: NEGATIVE
Glucose, UA: NEGATIVE
Ketones, UA: NEGATIVE
Leukocytes, UA: NEGATIVE
Nitrite, UA: NEGATIVE
Protein, UA: NEGATIVE
Spec Grav, UA: 1.015 (ref 1.010–1.025)
Urobilinogen, UA: 0.2 E.U./dL
pH, UA: 7.5 (ref 5.0–8.0)

## 2018-10-17 MED ORDER — CYCLOBENZAPRINE HCL 5 MG PO TABS: 5.0000 mg | ORAL_TABLET | Freq: Three times a day (TID) | ORAL | 0 refills | Status: DC | PRN

## 2018-10-17 MED ORDER — SULFAMETHOXAZOLE-TRIMETHOPRIM 800-160 MG PO TABS: 1.0000 | ORAL_TABLET | Freq: Two times a day (BID) | ORAL | 0 refills | Status: DC

## 2018-10-17 NOTE — Assessment & Plan Note (Addendum)
LBP suspicious for prostatitis given concomintant urinary symptoms vs lumbar strain. I have asked him to come in for UA, continue aleve 440mg  1-2 times daily. Discussed continued heating pad. Consider empiric abx pending UA results. If not improved with initial treatment, pt will come in for office visit. He agrees with plan.

## 2018-10-17 NOTE — Progress Notes (Signed)
Virtual visit completed through MyChart. Due to national recommendations of social distancing due to COVID 19, a virtual visit is felt to be most appropriate for this patient at this time.   Patient location: home Provider [facility] [place] [facility] If any vitals were documented, they were collected by patient at home unless specified below.    Ht 5' 8.5" (1.74 m)   Wt 175 lb (79.4 kg)   BMI 26.22 kg/m    CC: back pain Subjective:    Patient ID: Rachael DarbyLarry Benn, male    DOB: 10/18/1965, 53 y.o.   MRN: [id number]  HPI: Rachael DarbyLarry Hensch is a 53 y.o. male presenting on 10/17/2018 for Back Pain (C/o low back pain for about 3 wks. 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 Back pain is persisting. Over the past week noticing incomplete emptying on urination associated with pulling of back. 1 wk ago back pain woke him up. This happened again today. Mild dysuria. Describes pain as pulling pressure.   Treating with heating pad as well as aleve 440mg  once daily.   Denies fevers/chills, nausea/vomiting, hematuria. No rectal pain.  No h/o prostatitis in the past.  Denies inciting trauma/injury or falls. No shooting pain down legs, numbness/weakness of legs, bowel/bladder incontinence.      Relevant past medical, surgical, family and social history reviewed and updated as indicated. Interim medical history since our last visit reviewed. Allergies and medications reviewed and updated. Outpatient Medications Prior to Visit  Medication Sig Dispense Refill  . naproxen sodium (ANAPROX) 220 MG tablet Take 220 mg by mouth 2 (two) times daily with a meal.    . pilocarpine (PILOCAR) 1 % ophthalmic solution Place 1 drop into the left eye 2 (two) times daily.      No facility-administered medications prior to visit.      Per HPI unless specifically indicated in ROS section below Review of Systems Objective:    Ht 5'  8.5" (1.74 m)   Wt 175 lb (79.4 kg)   BMI 26.22 kg/m   Wt Readings from Last 3 Encounters:  10/17/18 175 lb (79.4 kg)  12/06/17 159 lb 12 oz (72.5 kg)  01/06/17 177 lb (80.3 kg)     Physical exam: Gen: alert, NAD, not ill appearing Pulm: speaks in complete sentences without increased work of breathing Psych: normal mood, normal thought content      Results for orders placed or performed in visit on 08/10/18  Ferritin  Result Value Ref Range   Ferritin 16.8 (L) 22.0 - 322.0 ng/mL  IBC panel  Result Value Ref Range   Iron 97 42 - 165 ug/dL   Transferrin 409.8193.0 (L) 212.0 - 360.0 mg/dL   Saturation Ratios 11.935.9 20.0 - 50.0 %  CBC with Differential/Platelet  Result Value Ref Range   WBC 6.3 4.0 - 10.5 K/uL   RBC 4.53 4.22 - 5.81 Mil/uL   Hemoglobin 14.6 13.0 - 17.0 g/dL   HCT 14.741.8 82.939.0 - 56.252.0 %   MCV 92.3 78.0 - 100.0 fl   MCHC 34.9 30.0 - 36.0 g/dL   RDW 13.013.2 86.511.5 - 78.415.5 %   Platelets 251.0 150.0 - 400.0 K/uL   Neutrophils Relative % 50.9 43.0 - 77.0 %   Lymphocytes Relative 37.2 12.0 - 46.0 %   Monocytes Relative 7.5 3.0 - 12.0 %   Eosinophils Relative 3.4 0.0 - 5.0 %   Basophils Relative  1.0 0.0 - 3.0 %   Neutro Abs 3.2 1.4 - 7.7 K/uL   Lymphs Abs 2.4 0.7 - 4.0 K/uL   Monocytes Absolute 0.5 0.1 - 1.0 K/uL   Eosinophils Absolute 0.2 0.0 - 0.7 K/uL   Basophils Absolute 0.1 0.0 - 0.1 K/uL   Assessment & Plan:   Problem List Items Addressed This Visit    Hereditary hemochromatosis (HCC)   Acute midline low back pain without sciatica - Primary    LBP suspicious for prostatitis given concomintant urinary symptoms vs lumbar strain. I have asked him to come in for UA, continue aleve 440mg  1-2 times daily. Discussed continued heating pad. Consider empiric abx pending UA results. If not improved with initial treatment, pt will come in for office visit. He agrees with plan.       Relevant Orders   Urinalysis Dipstick       No orders of the defined types were placed in this  encounter.  Orders Placed This Encounter  Procedures  . Urinalysis Dipstick    Standing Status:   Future    Standing Expiration Date:   11/17/2018    Follow up plan: Return if symptoms worsen or fail to improve.  Eustaquio Boyden, MD

## 2018-10-17 NOTE — Addendum Note (Signed)
Addended by: Alvina Chou on: 10/17/2018 02:06 PM   Modules accepted: Orders

## 2018-10-18 ENCOUNTER — Other Ambulatory Visit: Payer: Self-pay

## 2018-10-18 ENCOUNTER — Other Ambulatory Visit: Payer: Medicare Other

## 2018-10-18 DIAGNOSIS — M545 Low back pain, unspecified: Secondary | ICD-10-CM

## 2018-10-19 LAB — URINE CULTURE
MICRO NUMBER:: 454136
Result:: NO GROWTH
SPECIMEN QUALITY:: ADEQUATE

## 2018-12-11 ENCOUNTER — Other Ambulatory Visit: Payer: Self-pay

## 2018-12-11 ENCOUNTER — Other Ambulatory Visit (INDEPENDENT_AMBULATORY_CARE_PROVIDER_SITE_OTHER): Payer: Medicare Other

## 2018-12-11 LAB — CBC WITH DIFFERENTIAL/PLATELET
Basophils Absolute: 0.1 10*3/uL (ref 0.0–0.1)
Basophils Relative: 0.5 % (ref 0.0–3.0)
Eosinophils Absolute: 0.3 10*3/uL (ref 0.0–0.7)
Eosinophils Relative: 2.6 % (ref 0.0–5.0)
HCT: 42.2 % (ref 39.0–52.0)
Hemoglobin: 14.6 g/dL (ref 13.0–17.0)
Lymphocytes Relative: 23.4 % (ref 12.0–46.0)
Lymphs Abs: 2.3 10*3/uL (ref 0.7–4.0)
MCHC: 34.5 g/dL (ref 30.0–36.0)
MCV: 92.3 fl (ref 78.0–100.0)
Monocytes Absolute: 0.5 10*3/uL (ref 0.1–1.0)
Monocytes Relative: 5.4 % (ref 3.0–12.0)
Neutro Abs: 6.7 10*3/uL (ref 1.4–7.7)
Neutrophils Relative %: 68.1 % (ref 43.0–77.0)
Platelets: 268 10*3/uL (ref 150.0–400.0)
RBC: 4.58 Mil/uL (ref 4.22–5.81)
RDW: 15.4 % (ref 11.5–15.5)
WBC: 9.9 10*3/uL (ref 4.0–10.5)

## 2018-12-12 ENCOUNTER — Other Ambulatory Visit: Payer: Self-pay

## 2018-12-13 ENCOUNTER — Other Ambulatory Visit (INDEPENDENT_AMBULATORY_CARE_PROVIDER_SITE_OTHER): Payer: Medicare Other

## 2018-12-13 ENCOUNTER — Other Ambulatory Visit: Payer: Self-pay | Admitting: Family Medicine

## 2018-12-13 ENCOUNTER — Other Ambulatory Visit: Payer: Self-pay

## 2018-12-13 DIAGNOSIS — E038 Other specified hypothyroidism: Secondary | ICD-10-CM

## 2018-12-13 DIAGNOSIS — Z125 Encounter for screening for malignant neoplasm of prostate: Secondary | ICD-10-CM

## 2018-12-13 DIAGNOSIS — E785 Hyperlipidemia, unspecified: Secondary | ICD-10-CM

## 2018-12-13 DIAGNOSIS — E039 Hypothyroidism, unspecified: Secondary | ICD-10-CM

## 2018-12-13 LAB — CBC WITH DIFFERENTIAL/PLATELET
Basophils Absolute: 0.1 10*3/uL (ref 0.0–0.1)
Basophils Relative: 0.8 % (ref 0.0–3.0)
Eosinophils Absolute: 0.3 10*3/uL (ref 0.0–0.7)
Eosinophils Relative: 3.4 % (ref 0.0–5.0)
HCT: 40.4 % (ref 39.0–52.0)
Hemoglobin: 14.1 g/dL (ref 13.0–17.0)
Lymphocytes Relative: 31.2 % (ref 12.0–46.0)
Lymphs Abs: 2.4 10*3/uL (ref 0.7–4.0)
MCHC: 34.8 g/dL (ref 30.0–36.0)
MCV: 92.5 fl (ref 78.0–100.0)
Monocytes Absolute: 0.4 10*3/uL (ref 0.1–1.0)
Monocytes Relative: 4.9 % (ref 3.0–12.0)
Neutro Abs: 4.6 10*3/uL (ref 1.4–7.7)
Neutrophils Relative %: 59.7 % (ref 43.0–77.0)
Platelets: 280 10*3/uL (ref 150.0–400.0)
RBC: 4.37 Mil/uL (ref 4.22–5.81)
RDW: 15.1 % (ref 11.5–15.5)
WBC: 7.7 10*3/uL (ref 4.0–10.5)

## 2018-12-13 LAB — LIPID PANEL
Cholesterol: 120 mg/dL (ref 0–200)
HDL: 54.2 mg/dL (ref >39.00)
LDL Cholesterol: 49 mg/dL (ref 0–99)
NonHDL: 65.77
Total CHOL/HDL Ratio: 2
Triglycerides: 84 mg/dL (ref 0.0–149.0)
VLDL: 16.8 mg/dL (ref 0.0–40.0)

## 2018-12-13 LAB — PSA: PSA: 0.27 ng/mL (ref 0.10–4.00)

## 2018-12-13 LAB — IBC PANEL
Iron: 76 ug/dL (ref 42–165)
Saturation Ratios: 28.1 % (ref 20.0–50.0)
Transferrin: 193 mg/dL — ABNORMAL LOW (ref 212.0–360.0)

## 2018-12-13 LAB — TSH: TSH: 6.77 u[IU]/mL — ABNORMAL HIGH (ref 0.35–4.50)

## 2018-12-13 LAB — T4, FREE: Free T4: 0.7 ng/dL (ref 0.60–1.60)

## 2018-12-13 LAB — FERRITIN: Ferritin: 24 ng/mL (ref 22.0–322.0)

## 2018-12-17 ENCOUNTER — Encounter: Payer: Self-pay | Admitting: Family Medicine

## 2018-12-17 ENCOUNTER — Telehealth (INDEPENDENT_AMBULATORY_CARE_PROVIDER_SITE_OTHER): Payer: Medicare Other | Admitting: Family Medicine

## 2018-12-17 VITALS — Ht 68.5 in | Wt 178.0 lb

## 2018-12-17 DIAGNOSIS — E038 Other specified hypothyroidism: Secondary | ICD-10-CM

## 2018-12-17 DIAGNOSIS — Z0000 Encounter for general adult medical examination without abnormal findings: Secondary | ICD-10-CM

## 2018-12-17 DIAGNOSIS — H3552 Pigmentary retinal dystrophy: Secondary | ICD-10-CM

## 2018-12-17 DIAGNOSIS — E785 Hyperlipidemia, unspecified: Secondary | ICD-10-CM | POA: Diagnosis not present

## 2018-12-17 DIAGNOSIS — E039 Hypothyroidism, unspecified: Secondary | ICD-10-CM

## 2018-12-17 NOTE — Assessment & Plan Note (Signed)
Great control off meds The ASCVD Risk score Mikey Bussing DC Jr., et al., 2013) failed to calculate for the following reasons:   The systolic blood pressure is missing   The valid total cholesterol range is 130 to 320 mg/dL

## 2018-12-17 NOTE — Assessment & Plan Note (Signed)
Preventative protocols reviewed and updated unless pt declined. Discussed healthy diet and lifestyle.  

## 2018-12-17 NOTE — Assessment & Plan Note (Signed)
Appreciate GI care. Receives Q other month blood draws.

## 2018-12-17 NOTE — Assessment & Plan Note (Signed)
Appreciate retinologist care.

## 2018-12-17 NOTE — Assessment & Plan Note (Addendum)
Denies hypothyroid symptoms. Will continue to watch for now with rpt TFTs 4-6 mo.

## 2018-12-17 NOTE — Progress Notes (Signed)
Virtual visit completed through [facility]. Due to national recommendations of social distancing due to COVID-19, a virtual visit is felt to be most appropriate for this patient at this time. Reviewed limitations of a virtual visit.   Patient location: home Provider location: [place] at [facility], office If any vitals were documented, they were collected by patient at home unless specified below.    Ht 5' 8.5" (1.74 m)   Wt 178 lb (80.7 kg)   BMI 26.67 kg/m   BP Readings from Last 3 Encounters:  12/06/17 120/82  01/06/17 (!) 143/83  11/10/16 122/70    CC: CPE Subjective:    Patient ID: Michael Vaughan, male    DOB: [birth year], 53 y.o.   MRN: [id number]  HPI: Michael Vaughan is a 53 y.o. male presenting on 12/17/2018 for Annual Exam   H/o retinitis pigmentosa - he no longer drives (since 9417).  Iron overload - from hemochromatosis.   TSH elevated - denies hypothyroid symptoms like constipation, unexpected weight gain. Some intermittent fatigue. Declines treatment at this time, recommended more close testing.   Preventative: COLONOSCOPY 12/2016 WNL [facility] Plan).  Prostate cancer screening - brother developed prostate cancer at age 36 yo. Would like to continue screening regularly.  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 Advanced directive - has not set up. would want brother and 2 sons to be HCPOA. Packet provided today.  Seat belt use discussed.  Sunscreen use discussed. No changing moles on skin. Ex smoker - quit 2005 Alcohol - 1-2 beers/night several nights a week Dentist - not in several yrs  Eye exam - sees retinologist Zigmund Daniel yearly (retinitis pigmentosa)  Caffeine: 4-6 cups coffee  Lives with 1 son, has other grown children, dog and cows  Occupation: on disability for vision loss, legally blind, raises cows  Edu: HS  Activity: stays active on farm, raises cows, some hunting/fishing  Diet:  good water, daily fruits/vegetables     Relevant past medical, surgical, family and social history reviewed and updated as indicated. Interim medical history since our last visit reviewed. Allergies and medications reviewed and updated. Outpatient Medications Prior to Visit  Medication Sig Dispense Refill  . naproxen sodium (ANAPROX) 220 MG tablet Take 220 mg by mouth 2 (two) times daily with a meal.    . pilocarpine (PILOCAR) 1 % ophthalmic solution Place 1 drop into the left eye 2 (two) times daily.     . cyclobenzaprine (FLEXERIL) 5 MG tablet Take 1 tablet (5 mg total) by mouth 3 (three) times daily as needed for muscle spasms. 30 tablet 0  . sulfamethoxazole-trimethoprim (BACTRIM DS) 800-160 MG tablet Take 1 tablet by mouth 2 (two) times daily. 28 tablet 0   No facility-administered medications prior to visit.      Per HPI unless specifically indicated in ROS section below Review of Systems  Constitutional: Negative for activity change, appetite change, chills, fatigue, fever and unexpected weight change.  HENT: Negative for hearing loss.   Eyes: Negative for visual disturbance.  Respiratory: Negative for cough, chest tightness, shortness of breath and wheezing.   Cardiovascular: Negative for chest pain, palpitations and leg swelling.  Gastrointestinal: Negative for abdominal distention, abdominal pain, blood in stool, constipation, diarrhea, nausea and vomiting.  Genitourinary: Negative for difficulty urinating and hematuria.  Musculoskeletal: Negative for arthralgias, myalgias and neck pain.  Skin: Negative for rash.  Neurological: Negative for dizziness, seizures, syncope and headaches.  Hematological: Negative for adenopathy.  Does not bruise/bleed easily.  Psychiatric/Behavioral: Negative for dysphoric mood. The patient is not nervous/anxious.    Objective:    Ht 5' 8.5" (1.74 m)   Wt 178 lb (80.7 kg)   BMI 26.67 kg/m   Wt Readings from Last 3 Encounters:  12/17/18 178  lb (80.7 kg)  10/17/18 175 lb (79.4 kg)  12/06/17 159 lb 12 oz (72.5 kg)     Physical exam: Gen: alert, NAD, not ill appearing Pulm: speaks in complete sentences without increased work of breathing Psych: normal mood, normal thought content      Results for orders placed or performed in visit on 12/13/18  IBC panel  Result Value Ref Range   Iron 76 42 - 165 ug/dL   Transferrin 829.5193.0 (L) 212.0 - 360.0 mg/dL   Saturation Ratios 62.128.1 20.0 - 50.0 %  Ferritin  Result Value Ref Range   Ferritin 24.0 22.0 - 322.0 ng/mL  CBC with Differential/Platelet  Result Value Ref Range   WBC 7.7 4.0 - 10.5 K/uL   RBC 4.37 4.22 - 5.81 Mil/uL   Hemoglobin 14.1 13.0 - 17.0 g/dL   HCT 30.840.4 65.739.0 - 84.652.0 %   MCV 92.5 78.0 - 100.0 fl   MCHC 34.8 30.0 - 36.0 g/dL   RDW 96.215.1 95.211.5 - 84.115.5 %   Platelets 280.0 150.0 - 400.0 K/uL   Neutrophils Relative % 59.7 43.0 - 77.0 %   Lymphocytes Relative 31.2 12.0 - 46.0 %   Monocytes Relative 4.9 3.0 - 12.0 %   Eosinophils Relative 3.4 0.0 - 5.0 %   Basophils Relative 0.8 0.0 - 3.0 %   Neutro Abs 4.6 1.4 - 7.7 K/uL   Lymphs Abs 2.4 0.7 - 4.0 K/uL   Monocytes Absolute 0.4 0.1 - 1.0 K/uL   Eosinophils Absolute 0.3 0.0 - 0.7 K/uL   Basophils Absolute 0.1 0.0 - 0.1 K/uL  PSA  Result Value Ref Range   PSA 0.27 0.10 - 4.00 ng/mL  T4, free  Result Value Ref Range   Free T4 0.70 0.60 - 1.60 ng/dL  TSH  Result Value Ref Range   TSH 6.77 (H) 0.35 - 4.50 uIU/mL  Lipid panel  Result Value Ref Range   Cholesterol 120 0 - 200 mg/dL   Triglycerides 32.484.0 0.0 - 149.0 mg/dL   HDL 40.1054.20 >27.25>39.00 mg/dL   VLDL 36.616.8 0.0 - 44.040.0 mg/dL   LDL Cholesterol 49 0 - 99 mg/dL   Total CHOL/HDL Ratio 2    NonHDL 65.77    Assessment & Plan:   Problem List Items Addressed This Visit    Subclinical hypothyroidism    Denies hypothyroid symptoms. Will continue to watch for now with rpt TFTs 4-6 mo.       Relevant Orders   TSH   T4, free   Retinitis pigmentosa    Appreciate  retinologist care.       Hereditary hemochromatosis (HCC)    Appreciate GI care. Receives Q other month blood draws.      Health maintenance examination - Primary    Preventative protocols reviewed and updated unless pt declined. Discussed healthy diet and lifestyle.       Dyslipidemia    Great control off meds The ASCVD Risk score Denman George(Goff DC Jr., et al., 2013) failed to calculate for the following reasons:   The systolic blood pressure is missing   The valid total cholesterol range is 130 to 320 mg/dL  No orders of the defined types were placed in this encounter.  Orders Placed This Encounter  Procedures  . TSH    Standing Status:   Future    Standing Expiration Date:   12/17/2019  . T4, free    Standing Status:   Future    Standing Expiration Date:   12/17/2019    I discussed the assessment and treatment plan with the patient. The patient was provided an opportunity to ask questions and all were answered. The patient agreed with the plan and demonstrated an understanding of the instructions. The patient was advised to call back or seek an in-person evaluation if the symptoms worsen or if the condition fails to improve as anticipated.  Follow up plan: No follow-ups on file.  Eustaquio BoydenJavier Rolin Schult, MD

## 2019-02-12 ENCOUNTER — Other Ambulatory Visit (INDEPENDENT_AMBULATORY_CARE_PROVIDER_SITE_OTHER): Payer: Medicare Other

## 2019-02-12 LAB — CBC WITH DIFFERENTIAL/PLATELET
Basophils Absolute: 0.1 10*3/uL (ref 0.0–0.1)
Basophils Relative: 0.9 % (ref 0.0–3.0)
Eosinophils Absolute: 0.3 10*3/uL (ref 0.0–0.7)
Eosinophils Relative: 4.7 % (ref 0.0–5.0)
HCT: 43 % (ref 39.0–52.0)
Hemoglobin: 15 g/dL (ref 13.0–17.0)
Lymphocytes Relative: 31.5 % (ref 12.0–46.0)
Lymphs Abs: 2.1 10*3/uL (ref 0.7–4.0)
MCHC: 35 g/dL (ref 30.0–36.0)
MCV: 94.3 fl (ref 78.0–100.0)
Monocytes Absolute: 0.5 10*3/uL (ref 0.1–1.0)
Monocytes Relative: 6.9 % (ref 3.0–12.0)
Neutro Abs: 3.8 10*3/uL (ref 1.4–7.7)
Neutrophils Relative %: 56 % (ref 43.0–77.0)
Platelets: 231 10*3/uL (ref 150.0–400.0)
RBC: 4.56 Mil/uL (ref 4.22–5.81)
RDW: 14 % (ref 11.5–15.5)
WBC: 6.8 10*3/uL (ref 4.0–10.5)

## 2019-02-14 ENCOUNTER — Other Ambulatory Visit: Payer: Self-pay

## 2019-02-14 NOTE — Progress Notes (Signed)
Cbc

## 2019-04-09 ENCOUNTER — Other Ambulatory Visit (INDEPENDENT_AMBULATORY_CARE_PROVIDER_SITE_OTHER): Payer: Medicare Other

## 2019-04-09 LAB — CBC WITH DIFFERENTIAL/PLATELET
Basophils Absolute: 0.1 10*3/uL (ref 0.0–0.1)
Basophils Relative: 1.1 % (ref 0.0–3.0)
Eosinophils Absolute: 0.3 10*3/uL (ref 0.0–0.7)
Eosinophils Relative: 4.2 % (ref 0.0–5.0)
HCT: 43.4 % (ref 39.0–52.0)
Hemoglobin: 15.1 g/dL (ref 13.0–17.0)
Lymphocytes Relative: 32.9 % (ref 12.0–46.0)
Lymphs Abs: 2.3 10*3/uL (ref 0.7–4.0)
MCHC: 34.7 g/dL (ref 30.0–36.0)
MCV: 94 fl (ref 78.0–100.0)
Monocytes Absolute: 0.5 10*3/uL (ref 0.1–1.0)
Monocytes Relative: 7.2 % (ref 3.0–12.0)
Neutro Abs: 3.8 10*3/uL (ref 1.4–7.7)
Neutrophils Relative %: 54.6 % (ref 43.0–77.0)
Platelets: 234 10*3/uL (ref 150.0–400.0)
RBC: 4.61 Mil/uL (ref 4.22–5.81)
RDW: 13.7 % (ref 11.5–15.5)
WBC: 7 10*3/uL (ref 4.0–10.5)

## 2019-04-09 LAB — IBC PANEL
Iron: 114 ug/dL (ref 42–165)
Saturation Ratios: 41.1 % (ref 20.0–50.0)
Transferrin: 198 mg/dL — ABNORMAL LOW (ref 212.0–360.0)

## 2019-04-09 LAB — HEPATIC FUNCTION PANEL
ALT: 20 U/L (ref 0–53)
AST: 21 U/L (ref 0–37)
Albumin: 4.3 g/dL (ref 3.5–5.2)
Alkaline Phosphatase: 62 U/L (ref 39–117)
Bilirubin, Direct: 0.3 mg/dL (ref 0.0–0.3)
Total Bilirubin: 1.3 mg/dL — ABNORMAL HIGH (ref 0.2–1.2)
Total Protein: 6.7 g/dL (ref 6.0–8.3)

## 2019-04-11 LAB — FERRITIN: Ferritin: 15.3 ng/mL — ABNORMAL LOW (ref 22.0–322.0)

## 2019-04-16 ENCOUNTER — Other Ambulatory Visit: Payer: Self-pay

## 2019-04-23 ENCOUNTER — Other Ambulatory Visit: Payer: Medicare Other

## 2019-04-23 ENCOUNTER — Other Ambulatory Visit (INDEPENDENT_AMBULATORY_CARE_PROVIDER_SITE_OTHER): Payer: Medicare Other

## 2019-04-23 DIAGNOSIS — E785 Hyperlipidemia, unspecified: Secondary | ICD-10-CM | POA: Diagnosis not present

## 2019-04-23 DIAGNOSIS — E038 Other specified hypothyroidism: Secondary | ICD-10-CM

## 2019-04-23 DIAGNOSIS — E039 Hypothyroidism, unspecified: Secondary | ICD-10-CM | POA: Diagnosis not present

## 2019-04-23 LAB — COMPREHENSIVE METABOLIC PANEL
ALT: 17 U/L (ref 0–53)
AST: 19 U/L (ref 0–37)
Albumin: 4.1 g/dL (ref 3.5–5.2)
Alkaline Phosphatase: 57 U/L (ref 39–117)
BUN: 11 mg/dL (ref 6–23)
CO2: 26 mEq/L (ref 19–32)
Calcium: 8.6 mg/dL (ref 8.4–10.5)
Chloride: 106 mEq/L (ref 96–112)
Creatinine, Ser: 0.77 mg/dL (ref 0.40–1.50)
GFR: 105.28 mL/min (ref >60.00)
Glucose, Bld: 107 mg/dL — ABNORMAL HIGH (ref 70–99)
Potassium: 4.2 mEq/L (ref 3.5–5.1)
Sodium: 138 mEq/L (ref 135–145)
Total Bilirubin: 1 mg/dL (ref 0.2–1.2)
Total Protein: 6 g/dL (ref 6.0–8.3)

## 2019-04-23 LAB — TSH: TSH: 6.23 u[IU]/mL — ABNORMAL HIGH (ref 0.35–4.50)

## 2019-04-23 LAB — T4, FREE: Free T4: 0.78 ng/dL (ref 0.60–1.60)

## 2019-06-10 ENCOUNTER — Ambulatory Visit: Payer: Medicare Other | Attending: Internal Medicine

## 2019-06-10 DIAGNOSIS — Z20822 Contact with and (suspected) exposure to covid-19: Secondary | ICD-10-CM

## 2019-06-12 LAB — NOVEL CORONAVIRUS, NAA: SARS-CoV-2, NAA: NOT DETECTED

## 2019-07-05 ENCOUNTER — Other Ambulatory Visit (INDEPENDENT_AMBULATORY_CARE_PROVIDER_SITE_OTHER): Payer: Medicare PPO

## 2019-07-05 LAB — CBC
HCT: 43.9 % (ref 39.0–52.0)
Hemoglobin: 15.3 g/dL (ref 13.0–17.0)
MCHC: 34.8 g/dL (ref 30.0–36.0)
MCV: 91.5 fl (ref 78.0–100.0)
Platelets: 237 10*3/uL (ref 150.0–400.0)
RBC: 4.8 Mil/uL (ref 4.22–5.81)
RDW: 14 % (ref 11.5–15.5)
WBC: 8 10*3/uL (ref 4.0–10.5)

## 2019-07-05 LAB — FERRITIN: Ferritin: 12.4 ng/mL — ABNORMAL LOW (ref 22.0–322.0)

## 2019-07-09 ENCOUNTER — Other Ambulatory Visit: Payer: Self-pay

## 2019-07-22 ENCOUNTER — Encounter (INDEPENDENT_AMBULATORY_CARE_PROVIDER_SITE_OTHER): Payer: Medicare Other | Admitting: Ophthalmology

## 2019-07-22 ENCOUNTER — Other Ambulatory Visit: Payer: Self-pay

## 2019-07-22 DIAGNOSIS — H3552 Pigmentary retinal dystrophy: Secondary | ICD-10-CM

## 2019-07-22 DIAGNOSIS — H35373 Puckering of macula, bilateral: Secondary | ICD-10-CM | POA: Diagnosis not present

## 2019-10-17 ENCOUNTER — Other Ambulatory Visit (INDEPENDENT_AMBULATORY_CARE_PROVIDER_SITE_OTHER): Payer: Medicare PPO

## 2019-10-17 LAB — CBC
HCT: 42.4 % (ref 39.0–52.0)
Hemoglobin: 14.7 g/dL (ref 13.0–17.0)
MCHC: 34.6 g/dL (ref 30.0–36.0)
MCV: 91.4 fl (ref 78.0–100.0)
Platelets: 238 10*3/uL (ref 150.0–400.0)
RBC: 4.65 Mil/uL (ref 4.22–5.81)
RDW: 14.7 % (ref 11.5–15.5)
WBC: 7.3 10*3/uL (ref 4.0–10.5)

## 2019-12-19 ENCOUNTER — Encounter: Payer: Self-pay | Admitting: Family Medicine

## 2019-12-19 ENCOUNTER — Ambulatory Visit (INDEPENDENT_AMBULATORY_CARE_PROVIDER_SITE_OTHER): Payer: Medicare PPO | Admitting: Family Medicine

## 2019-12-19 ENCOUNTER — Other Ambulatory Visit: Payer: Self-pay

## 2019-12-19 VITALS — BP 120/78 | HR 61 | Temp 98.2°F | Ht 69.0 in | Wt 191.2 lb

## 2019-12-19 DIAGNOSIS — Z125 Encounter for screening for malignant neoplasm of prostate: Secondary | ICD-10-CM

## 2019-12-19 DIAGNOSIS — E039 Hypothyroidism, unspecified: Secondary | ICD-10-CM

## 2019-12-19 DIAGNOSIS — H3552 Pigmentary retinal dystrophy: Secondary | ICD-10-CM

## 2019-12-19 DIAGNOSIS — E038 Other specified hypothyroidism: Secondary | ICD-10-CM

## 2019-12-19 DIAGNOSIS — Z0000 Encounter for general adult medical examination without abnormal findings: Secondary | ICD-10-CM

## 2019-12-19 DIAGNOSIS — E785 Hyperlipidemia, unspecified: Secondary | ICD-10-CM | POA: Diagnosis not present

## 2019-12-19 DIAGNOSIS — Z7189 Other specified counseling: Secondary | ICD-10-CM

## 2019-12-19 LAB — COMPREHENSIVE METABOLIC PANEL
ALT: 26 U/L (ref 0–53)
AST: 21 U/L (ref 0–37)
Albumin: 4.4 g/dL (ref 3.5–5.2)
Alkaline Phosphatase: 55 U/L (ref 39–117)
BUN: 19 mg/dL (ref 6–23)
CO2: 29 mEq/L (ref 19–32)
Calcium: 9.3 mg/dL (ref 8.4–10.5)
Chloride: 104 mEq/L (ref 96–112)
Creatinine, Ser: 0.84 mg/dL (ref 0.40–1.50)
GFR: 94.99 mL/min (ref >60.00)
Glucose, Bld: 112 mg/dL — ABNORMAL HIGH (ref 70–99)
Potassium: 4.6 mEq/L (ref 3.5–5.1)
Sodium: 138 mEq/L (ref 135–145)
Total Bilirubin: 1.5 mg/dL — ABNORMAL HIGH (ref 0.2–1.2)
Total Protein: 6.6 g/dL (ref 6.0–8.3)

## 2019-12-19 LAB — IBC PANEL
Iron: 90 ug/dL (ref 42–165)
Saturation Ratios: 29.8 % (ref 20.0–50.0)
Transferrin: 216 mg/dL (ref 212.0–360.0)

## 2019-12-19 LAB — TSH: TSH: 4.85 u[IU]/mL — ABNORMAL HIGH (ref 0.35–4.50)

## 2019-12-19 LAB — FERRITIN: Ferritin: 14.8 ng/mL — ABNORMAL LOW (ref 22.0–322.0)

## 2019-12-19 LAB — LIPID PANEL
Cholesterol: 118 mg/dL (ref 0–200)
HDL: 57 mg/dL (ref >39.00)
LDL Cholesterol: 53 mg/dL (ref 0–99)
NonHDL: 61.04
Total CHOL/HDL Ratio: 2
Triglycerides: 42 mg/dL (ref 0.0–149.0)
VLDL: 8.4 mg/dL (ref 0.0–40.0)

## 2019-12-19 LAB — T4, FREE: Free T4: 0.78 ng/dL (ref 0.60–1.60)

## 2019-12-19 LAB — PSA: PSA: 0.16 ng/mL (ref 0.10–4.00)

## 2019-12-19 NOTE — Patient Instructions (Addendum)
Advanced directive packet provided today  You are doing well today.  Labs today  Return as needed or in 1 year for next physical   Health Maintenance, Male Adopting a healthy lifestyle and getting preventive care are important in promoting health and wellness. Ask your health care provider about:  The right schedule for you to have regular tests and exams.  Things you can do on your own to prevent diseases and keep yourself healthy. What should I know about diet, weight, and exercise? Eat a healthy diet   Eat a diet that includes plenty of vegetables, fruits, low-fat dairy products, and lean protein.  Do not eat a lot of foods that are high in solid fats, added sugars, or sodium. Maintain a healthy weight Body mass index (BMI) is a measurement that can be used to identify possible weight problems. It estimates body fat based on height and weight. Your health care provider can help determine your BMI and help you achieve or maintain a healthy weight. Get regular exercise Get regular exercise. This is one of the most important things you can do for your health. Most adults should:  Exercise for at least 150 minutes each week. The exercise should increase your heart rate and make you sweat (moderate-intensity exercise).  Do strengthening exercises at least twice a week. This is in addition to the moderate-intensity exercise.  Spend less time sitting. Even light physical activity can be beneficial. Watch cholesterol and blood lipids Have your blood tested for lipids and cholesterol at 55 years of age, then have this test every 5 years. You may need to have your cholesterol levels checked more often if:  Your lipid or cholesterol levels are high.  You are older than 55 years of age.  You are at high risk for heart disease. What should I know about cancer screening? Many types of cancers can be detected early and may often be prevented. Depending on your health history and family  history, you may need to have cancer screening at various ages. This may include screening for:  Colorectal cancer.  Prostate cancer.  Skin cancer.  Lung cancer. What should I know about heart disease, diabetes, and high blood pressure? Blood pressure and heart disease  High blood pressure causes heart disease and increases the risk of stroke. This is more likely to develop in people who have high blood pressure readings, are of African descent, or are overweight.  Talk with your health care provider about your target blood pressure readings.  Have your blood pressure checked: ? Every 3-5 years if you are 40-27 years of age. ? Every year if you are 71 years old or older.  If you are between the ages of 46 and 36 and are a current or former smoker, ask your health care provider if you should have a one-time screening for abdominal aortic aneurysm (AAA). Diabetes Have regular diabetes screenings. This checks your fasting blood sugar level. Have the screening done:  Once every three years after age 66 if you are at a normal weight and have a low risk for diabetes.  More often and at a younger age if you are overweight or have a high risk for diabetes. What should I know about preventing infection? Hepatitis B If you have a higher risk for hepatitis B, you should be screened for this virus. Talk with your health care provider to find out if you are at risk for hepatitis B infection. Hepatitis C Blood testing is recommended for:  Everyone born from [birth year] through 1966.  Anyone with known risk factors for hepatitis C. Sexually transmitted infections (STIs)  You should be screened each year for STIs, including gonorrhea and chlamydia, if: ? You are sexually active and are younger than 55 years of age. ? You are older than 55 years of age and your health care provider tells you that you are at risk for this type of infection. ? Your sexual activity has changed since you were last  screened, and you are at increased risk for chlamydia or gonorrhea. Ask your health care provider if you are at risk.  Ask your health care provider about whether you are at high risk for HIV. Your health care provider may recommend a prescription medicine to help prevent HIV infection. If you choose to take medicine to prevent HIV, you should first get tested for HIV. You should then be tested every 3 months for as long as you are taking the medicine. Follow these instructions at home: Lifestyle  Do not use any products that contain nicotine or tobacco, such as cigarettes, e-cigarettes, and chewing tobacco. If you need help quitting, ask your health care provider.  Do not use street drugs.  Do not share needles.  Ask your health care provider for help if you need support or information about quitting drugs. Alcohol use  Do not drink alcohol if your health care provider tells you not to drink.  If you drink alcohol: ? Limit how much you have to 0-2 drinks a day. ? Be aware of how much alcohol is in your drink. In the [place] one drink equals one 12 oz bottle of beer (355 mL), one 5 oz glass of wine (148 mL), or one 1 oz glass of hard liquor (44 mL). General instructions  Schedule regular health, dental, and eye exams.  Stay current with your vaccines.  Tell your health care provider if: ? You often feel depressed. ? You have ever been abused or do not feel safe at home. Summary  Adopting a healthy lifestyle and getting preventive care are important in promoting health and wellness.  Follow your health care provider's instructions about healthy diet, exercising, and getting tested or screened for diseases.  Follow your health care provider's instructions on monitoring your cholesterol and blood pressure. This information is not intended to replace advice given to you by your health care provider. Make sure you discuss any questions you have with your health care provider. Document  Revised: 05/23/2018 Document Reviewed: 05/23/2018 Elsevier Patient Education  2020 [employer].

## 2019-12-19 NOTE — Assessment & Plan Note (Signed)
Sees GI with regular blood draws.

## 2019-12-19 NOTE — Assessment & Plan Note (Signed)
Advanced directive - has not set up yet. would wantbrother and 2 sons tobe HCPOA.Packet provided again today.

## 2019-12-19 NOTE — Progress Notes (Signed)
This visit was conducted in person.  BP 120/78 (BP Location: Left Arm, Patient Position: Sitting, Cuff Size: Normal)   Pulse 61   Temp 98.2 F (36.8 C) (Temporal)   Ht 5\' 9"  (1.753 m)   Wt 191 lb 3 oz (86.7 kg)   SpO2 97%   BMI 28.23 kg/m    CC: AMW/CPE Subjective:    Patient ID: Srinivas Lippman, male    DOB: 1966/05/26, 54 y.o.   MRN: [id number]  HPI: Benjermin Korber is a 54 y.o. male presenting on 12/19/2019 for Medicare Wellness   Did not see health advisor.   Hearing Screening   125Hz  250Hz  500Hz  1000Hz  2000Hz  3000Hz  4000Hz  6000Hz  8000Hz   Right ear:   20 20 20  25     Left ear:   20 20 20   40    Vision Screening Comments: Last eye exam, 07/2019.    Office Visit from 12/19/2019 in [facility] at [address]  PHQ-2 Total Score 0      Fall Risk  12/19/2019 12/06/2017 10/27/2015 09/10/2014 05/24/2013  Falls in the past year? 0 No No No No      H/o retinitis pigmentosa - he no longer drives (since 2000).  Iron overload - from hemochromatosis.   TSH elevated - denies hypothyroid symptoms like constipation, unexpected weight gain. Some intermittent fatigue. Declines treatment at this time, recommended more close testing.   Preventative: COLONOSCOPY 12/2016 WNL ). Prostate cancer screening - brother developed prostate cancer at age 83 yo. Would like to continue screening regularly.  Lung cancer screening - not eligible Flu shot yearly Tdap12/2013 COVID vaccine - completed Pfizer 08/2019. 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 Advanced directive - has not set up yet. would wantbrother and 2 sons tobe HCPOA.Packet provided again today. Seat belt use discussed. Sunscreen use discussed. No changing moles on skin. Ex smoker - quit 2005 Alcohol -2 beers/night several nights a week Dentist - not in several yrs  Eye exam - sees retinologist 02/19/2020 yearly (retinitis pigmentosa)  Caffeine: 4-6 cups coffee   Separated from wife Lives with 1 son, has other grown children, dog, chickens and cows  Occupation: on disability for vision loss, legally blind, raises cows  Edu: HS  Activity: stays active on farm, raises cows, some hunting/fishing  Diet: good water, daily fruits/vegetables     Relevant past medical, surgical, family and social history reviewed and updated as indicated. Interim medical history since our last visit reviewed. Allergies and medications reviewed and updated. Outpatient Medications Prior to Visit  Medication Sig Dispense Refill  . naproxen sodium (ANAPROX) 220 MG tablet Take 220 mg by mouth 2 (two) times daily with a meal.    . pilocarpine (PILOCAR) 1 % ophthalmic solution Place 1 drop into the left eye 2 (two) times daily.      No facility-administered medications prior to visit.     Per HPI unless specifically indicated in ROS section below Review of Systems  Constitutional: Negative for activity change, appetite change, chills, fatigue, fever and unexpected weight change.  HENT: Negative for hearing loss.   Eyes: Negative for visual disturbance.  Respiratory: Negative for cough, chest tightness, shortness of breath and wheezing.   Cardiovascular: Negative for chest pain, palpitations and leg swelling.  Gastrointestinal: Negative for abdominal distention, abdominal pain, blood in stool, constipation, diarrhea, nausea and vomiting.  Genitourinary: Negative for difficulty urinating and hematuria.  Musculoskeletal: Negative for arthralgias, myalgias and neck pain.  Skin:  Negative for rash.  Neurological: Negative for dizziness, seizures, syncope and headaches.  Hematological: Negative for adenopathy. Does not bruise/bleed easily.  Psychiatric/Behavioral: Negative for dysphoric mood. The patient is not nervous/anxious.    Objective:  BP 120/78 (BP Location: Left Arm, Patient Position: Sitting, Cuff Size: Normal)   Pulse 61   Temp 98.2 F (36.8 C) (Temporal)   Ht  5\' 9"  (1.753 m)   Wt 191 lb 3 oz (86.7 kg)   SpO2 97%   BMI 28.23 kg/m   Wt Readings from Last 3 Encounters:  12/19/19 191 lb 3 oz (86.7 kg)  12/17/18 178 lb (80.7 kg)  10/17/18 175 lb (79.4 kg)      Physical Exam Vitals and nursing note reviewed.  Constitutional:      General: He is not in acute distress.    Appearance: Normal appearance. He is well-developed. He is not ill-appearing.  HENT:     Head: Normocephalic and atraumatic.     Right Ear: Hearing, tympanic membrane, ear canal and external ear normal.     Left Ear: Hearing, tympanic membrane, ear canal and external ear normal.  Eyes:     General: No scleral icterus.    Extraocular Movements: Extraocular movements intact.     Conjunctiva/sclera: Conjunctivae normal.     Pupils: Pupils are equal, round, and reactive to light.  Cardiovascular:     Rate and Rhythm: Normal rate and regular rhythm.     Pulses: Normal pulses.          Radial pulses are 2+ on the right side and 2+ on the left side.     Heart sounds: Normal heart sounds. No murmur heard.   Pulmonary:     Effort: Pulmonary effort is normal. No respiratory distress.     Breath sounds: Normal breath sounds. No wheezing, rhonchi or rales.  Abdominal:     General: Abdomen is flat. Bowel sounds are normal. There is no distension.     Palpations: Abdomen is soft. There is no mass.     Tenderness: There is no abdominal tenderness. There is no guarding or rebound.     Hernia: No hernia is present.  Genitourinary:    Prostate: Normal. Not enlarged (20gm), not tender and no nodules present.     Rectum: Normal. No mass, tenderness, anal fissure, external hemorrhoid or internal hemorrhoid. Normal anal tone.  Musculoskeletal:        General: Normal range of motion.     Cervical back: Normal range of motion and neck supple.  Lymphadenopathy:     Cervical: No cervical adenopathy.  Skin:    General: Skin is warm and dry.     Findings: No rash.  Neurological:      General: No focal deficit present.     Mental Status: He is alert and oriented to person, place, and time.     Comments: CN grossly intact, station and gait intact  Psychiatric:        Mood and Affect: Mood normal.        Behavior: Behavior normal.        Thought Content: Thought content normal.        Judgment: Judgment normal.       Results for orders placed or performed in visit on 10/17/19  CBC  Result Value Ref Range   WBC 7.3 4.0 - 10.5 K/uL   RBC 4.65 4.22 - 5.81 Mil/uL   Platelets 238.0 150 - 400 K/uL   Hemoglobin 14.7 13.0 -  17.0 g/dL   HCT 40.8 39 - 52 %   MCV 91.4 78.0 - 100.0 fl   MCHC 34.6 30.0 - 36.0 g/dL   RDW 14.4 81.8 - 56.3 %   Assessment & Plan:  This visit occurred during the SARS-CoV-2 public health emergency.  Safety protocols were in place, including screening questions prior to the visit, additional usage of staff PPE, and extensive cleaning of exam room while observing appropriate contact time as indicated for disinfecting solutions.   Problem List Items Addressed This Visit    Subclinical hypothyroidism    Stable period. Update labs.       Relevant Orders   TSH   T4, free   Retinitis pigmentosa   Medicare annual wellness visit, subsequent - Primary    I have personally reviewed the Medicare Annual Wellness questionnaire and have noted 1. The patient's medical and social history 2. Their use of alcohol, tobacco or illicit drugs 3. Their current medications and supplements 4. The patient's functional ability including ADL's, fall risks, home safety risks and hearing or visual impairment. Cognitive function has been assessed and addressed as indicated.  5. Diet and physical activity 6. Evidence for depression or mood disorders The patients weight, height, BMI have been recorded in the chart. I have made referrals, counseling and provided education to the patient based on review of the above and I have provided the pt with a written personalized care  plan for preventive services. Provider list updated.. See scanned questionairre as needed for further documentation. Reviewed preventative protocols and updated unless pt declined.       Iron overload   Relevant Orders   Ferritin   IBC panel   Hereditary hemochromatosis (HCC)    Sees GI with regular blood draws.      Relevant Orders   Ferritin   IBC panel   Comprehensive metabolic panel   Health maintenance examination    Preventative protocols reviewed and updated unless pt declined. Discussed healthy diet and lifestyle.       Dyslipidemia    Chronic, stable off meds - update labs.       Relevant Orders   Lipid panel   Comprehensive metabolic panel   Advanced care planning/counseling discussion    Advanced directive - has not set up yet. would wantbrother and 2 sons tobe HCPOA.Packet provided again today.       Other Visit Diagnoses    Special screening for malignant neoplasm of prostate       Relevant Orders   PSA       No orders of the defined types were placed in this encounter.  Orders Placed This Encounter  Procedures  . Ferritin  . IBC panel  . Lipid panel  . Comprehensive metabolic panel  . TSH  . PSA  . T4, free    Patient instructions: Advanced directive packet provided today  You are doing well today.  Labs today  Return as needed or in 1 year for next physical   Follow up plan: No follow-ups on file.  Eustaquio Boyden, MD

## 2019-12-19 NOTE — Assessment & Plan Note (Signed)

## 2019-12-19 NOTE — Assessment & Plan Note (Signed)
Chronic, stable off meds - update labs.

## 2019-12-19 NOTE — Assessment & Plan Note (Signed)
Stable period. Update labs.

## 2019-12-19 NOTE — Assessment & Plan Note (Signed)
Preventative protocols reviewed and updated unless pt declined. Discussed healthy diet and lifestyle.  

## 2020-01-08 ENCOUNTER — Other Ambulatory Visit (INDEPENDENT_AMBULATORY_CARE_PROVIDER_SITE_OTHER): Payer: Medicare PPO

## 2020-01-08 LAB — CBC
HCT: 42.9 % (ref 39.0–52.0)
Hemoglobin: 14.9 g/dL (ref 13.0–17.0)
MCHC: 34.7 g/dL (ref 30.0–36.0)
MCV: 93.1 fl (ref 78.0–100.0)
Platelets: 255 10*3/uL (ref 150.0–400.0)
RBC: 4.61 Mil/uL (ref 4.22–5.81)
RDW: 14.3 % (ref 11.5–15.5)
WBC: 9 10*3/uL (ref 4.0–10.5)

## 2020-06-08 ENCOUNTER — Encounter: Payer: Self-pay | Admitting: Family Medicine

## 2020-06-08 ENCOUNTER — Telehealth (INDEPENDENT_AMBULATORY_CARE_PROVIDER_SITE_OTHER): Payer: Medicare PPO | Admitting: Family Medicine

## 2020-06-08 ENCOUNTER — Other Ambulatory Visit: Payer: Self-pay

## 2020-06-08 VITALS — Ht 69.0 in

## 2020-06-08 DIAGNOSIS — R059 Cough, unspecified: Secondary | ICD-10-CM | POA: Diagnosis not present

## 2020-06-08 DIAGNOSIS — R0981 Nasal congestion: Secondary | ICD-10-CM | POA: Diagnosis not present

## 2020-06-08 NOTE — Progress Notes (Signed)
Michael Vaughan T. Michael Vandermeulen, MD Primary Care and Sports Medicine [facility] at [facility] [address] [place], [address] Phone: [phone]  FAX: [phone]  [name] - 54 y.o. male  MRN [id number]  Date of Birth: Aug 14, 1965  Visit Date: 06/08/2020  PCP: Michael Boyden, MD  Referred by: Michael Boyden, MD  Virtual Visit via Telephone Note:  I connected with  Michael Vaughan on 06/08/2020  9:40 AM EST by telephone and verified that I am speaking with the correct person using two identifiers.   Location patient: home phone or cell phone Location provider: work or home office Consent: Verbal consent directly obtained from Michael Vaughan. Persons participating in the virtual visit: patient, provider  I discussed the limitations of evaluation and management by telemedicine and the availability of in person appointments.  The patient expressed understanding and agreed to proceed.     History of Present Illness:  Woke up Xmas morning.  Has had a cough off and on.  More coughing when asleep at night.  Chest better and now through his head.    Mucinex DM, alka selzer plus.  No known sick contacts.  Son had a cold.  On disability.  More at home.  He did not go and see other people in his family Christmas other than his son who lives at home.  Son is going to get some covid tests.    Immunization History  Administered Date(s) Administered  . Influenza Inj Mdck Quad Pf 07/08/2018  . Influenza Split 03/13/2012, 02/11/2013  . Influenza,inj,Quad PF,6+ Mos 04/06/2017  . Influenza-Unspecified 03/10/2014  . PFIZER SARS-COV-2 Vaccination 08/21/2019, 09/11/2019  . Tdap 05/24/2012     Review of Systems: pertinent positives and pertinent negatives as per HPI No acute distress verbally   Observations/Objective/Exam:  An attempt was made to discern vital signs over the phone and per  patient if applicable and possible.   Neurological:     Mental Status: pleasant and appropriate   Psychiatric:        Thought Content: Thought content normal.      Assessment and Plan:    ICD-10-CM   1. Nasal congestion  R09.81   2. Cough  R05.9    Total encounter time: 10 minutes. This includes total time spent on the day of encounter.  Nasal congestion and cough, more likely viral syndrome.  His son did have a URI and had multiple negative Covid test 1 or 2 weeks ago.  At this point, I think that he should get Covid tested.  His son is planning on getting him to in-home Covid test, and I think that that is probably reasonable.  He knows that this is going to be a little bit less accurate than PCR.  Start with some cold/flu over-the-counter medication, and I think that this will likely give him some relief.  He is to follow-up if his symptoms worsen over the week.  I discussed the assessment and treatment plan with the patient. The patient was provided an opportunity to ask questions and all were answered. The patient agreed with the plan and demonstrated an understanding of the instructions.   The patient was advised to call back or seek an in-person evaluation if the symptoms worsen or if the condition fails to improve as anticipated.  Follow-up: prn unless noted otherwise below No follow-ups on file.  No orders of the defined types were  placed in this encounter.  No orders of the defined types were placed in this encounter.   Signed,  Elpidio Galea. Kayana Thoen, MD

## 2020-07-03 ENCOUNTER — Telehealth: Payer: Self-pay

## 2020-07-03 ENCOUNTER — Other Ambulatory Visit: Payer: Self-pay

## 2020-07-03 ENCOUNTER — Other Ambulatory Visit (INDEPENDENT_AMBULATORY_CARE_PROVIDER_SITE_OTHER): Payer: Medicare PPO

## 2020-07-03 LAB — CBC
HCT: 46.8 % (ref 39.0–52.0)
Hemoglobin: 16.5 g/dL (ref 13.0–17.0)
MCHC: 35.3 g/dL (ref 30.0–36.0)
MCV: 94 fl (ref 78.0–100.0)
Platelets: 238 10*3/uL (ref 150.0–400.0)
RBC: 4.98 Mil/uL (ref 4.22–5.81)
RDW: 14.7 % (ref 11.5–15.5)
WBC: 8.5 10*3/uL (ref 4.0–10.5)

## 2020-07-03 NOTE — Telephone Encounter (Signed)
Meryl Dare, MD sent to Annett Fabian, RN He is overdue for office follow up, CBC, ferritin, Fe, TIBC and phlebotomies. Please call him to sort out why he is missing his appointments.     Left message for patient to call back

## 2020-07-06 ENCOUNTER — Other Ambulatory Visit (INDEPENDENT_AMBULATORY_CARE_PROVIDER_SITE_OTHER): Payer: Medicare PPO

## 2020-07-06 LAB — IBC + FERRITIN
Ferritin: 26.5 ng/mL (ref 22.0–322.0)
Iron: 118 ug/dL (ref 42–165)
Saturation Ratios: 39.2 % (ref 20.0–50.0)
Transferrin: 215 mg/dL (ref 212.0–360.0)

## 2020-07-06 NOTE — Addendum Note (Signed)
Addended by: Annett Fabian on: 07/06/2020 10:07 AM   Modules accepted: Orders

## 2020-07-06 NOTE — Telephone Encounter (Signed)
Patient returned call.  He got COVID when he was due in November and he says it completely slipped his mind he didn't go.  He will come for phlebotomy and labs soon. New orders entered.

## 2020-07-22 ENCOUNTER — Other Ambulatory Visit: Payer: Self-pay

## 2020-07-22 ENCOUNTER — Encounter (INDEPENDENT_AMBULATORY_CARE_PROVIDER_SITE_OTHER): Payer: Medicare PPO | Admitting: Ophthalmology

## 2020-07-22 DIAGNOSIS — H3552 Pigmentary retinal dystrophy: Secondary | ICD-10-CM

## 2020-07-22 DIAGNOSIS — H35371 Puckering of macula, right eye: Secondary | ICD-10-CM | POA: Diagnosis not present

## 2020-09-15 ENCOUNTER — Other Ambulatory Visit (INDEPENDENT_AMBULATORY_CARE_PROVIDER_SITE_OTHER): Payer: Medicare PPO

## 2020-09-15 LAB — IBC + FERRITIN
Ferritin: 17.2 ng/mL — ABNORMAL LOW (ref 22.0–322.0)
Iron: 166 ug/dL — ABNORMAL HIGH (ref 42–165)
Saturation Ratios: 59.9 % — ABNORMAL HIGH (ref 20.0–50.0)
Transferrin: 198 mg/dL — ABNORMAL LOW (ref 212.0–360.0)

## 2020-09-15 LAB — CBC
HCT: 44 % (ref 39.0–52.0)
Hemoglobin: 15.4 g/dL (ref 13.0–17.0)
MCHC: 35.1 g/dL (ref 30.0–36.0)
MCV: 92.6 fl (ref 78.0–100.0)
Platelets: 244 10*3/uL (ref 150.0–400.0)
RBC: 4.75 Mil/uL (ref 4.22–5.81)
RDW: 14 % (ref 11.5–15.5)
WBC: 6.3 10*3/uL (ref 4.0–10.5)

## 2020-12-08 ENCOUNTER — Other Ambulatory Visit (INDEPENDENT_AMBULATORY_CARE_PROVIDER_SITE_OTHER): Payer: Medicare PPO

## 2020-12-08 LAB — CBC
HCT: 44.8 % (ref 39.0–52.0)
Hemoglobin: 15.6 g/dL (ref 13.0–17.0)
MCHC: 34.9 g/dL (ref 30.0–36.0)
MCV: 91.3 fl (ref 78.0–100.0)
Platelets: 248 10*3/uL (ref 150.0–400.0)
RBC: 4.9 Mil/uL (ref 4.22–5.81)
RDW: 14.9 % (ref 11.5–15.5)
WBC: 6.9 10*3/uL (ref 4.0–10.5)

## 2020-12-08 LAB — IBC + FERRITIN
Ferritin: 22.1 ng/mL (ref 22.0–322.0)
Iron: 135 ug/dL (ref 42–165)
Saturation Ratios: 44.6 % (ref 20.0–50.0)
Transferrin: 216 mg/dL (ref 212.0–360.0)

## 2020-12-20 NOTE — Progress Notes (Signed)
Patient ID: Arliss Frisina, male    DOB: [birth year], 55 y.o.   MRN: [id number]  This visit was conducted in person.  BP 130/78 (BP Location: Left Arm, Patient Position: Sitting, Cuff Size: Normal)   Pulse 67   Temp 98.1 F (36.7 C) (Temporal)   Ht 5\' 9"  (1.753 m)   Wt 195 lb (88.5 kg)   SpO2 98%   BMI 28.80 kg/m    CC: AMW/CPE Subjective:   HPI: Eziah Negro is a 55 y.o. male presenting on 12/21/2020 for Medicare Wellness   Did not see health advisor   Hearing Screening   500Hz  1000Hz  2000Hz  4000Hz   Right ear 40 40 40 40  Left ear 40 40 40 0  Vision Screening - Comments:: [facility]: eye exam in Feb 2022  Flowsheet Row Office Visit from 12/21/2020 in [facility] [place]  PHQ-2 Total Score 0       Fall Risk  12/21/2020 12/19/2019 12/06/2017 10/27/2015 09/10/2014  Falls in the past year? 0 0 No No No  Follow up Falls evaluation completed - - - -    This year has been a struggle. Endorses no energy, insomnia - trouble getting to sleep - as well as non restorative sleep with occasional PNdyspnea but no witnessed apnea or significant daytime somnolence, worsening joint pains including L hip (groin) and R knee - planning to see ortho (Dr 02/21/2021 in [facility]) for this. No daytime naps.   H/o retinitis pigmentosa followed by eye doctor yearly 02/19/2020) - he no longer drives (since 2000).  Hemochromatosis with iron overload - Q22mo phlebotomy through GI 09/12/2014).   H/o TSH elevation - ongoing, worsening fatigue noted this past year. Interested in medication if levels remain abnormal. No constipation, skin/hair changes, cold intolerance.   Preventative: COLONOSCOPY 12/2016 WNL [name]).  Prostate cancer screening - brother developed prostate cancer at age 32 yo. Would like to continue yearly screening.  Lung cancer screening - not eligible  Flu shot yearly  Tdap 05/2012  COVID vaccine - Pfizer 08/2019 x2, booster 03/2021. 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  Advanced directive - has not set up yet. Would want brother and 2 sons to be HCPOA. Packet previously provided.  Seat belt use discussed.  Sunscreen use discussed. No changing moles on skin.  Ex smoker - quit 2005  Alcohol - 2 beers/night  Dentist - not in several yrs  Eye exam - sees retinologist 45 yearly (retinitis pigmentosa)   Caffeine: 4-6 cups coffee  Separated from wife  Lives with 1 son, has other grown children, dog, chickens and cows Occupation: on disability for vision loss, legally blind, raises cows Edu: HS  Activity: stays active on farm, raises cows, some hunting/fishing  Diet: good water, daily fruits/vegetables      Relevant past medical, surgical, family and social history reviewed and updated as indicated. Interim medical history since our last visit reviewed. Allergies and medications reviewed and updated. Outpatient Medications Prior to Visit  Medication Sig Dispense Refill   naproxen sodium (ANAPROX) 220 MG tablet Take 220 mg by mouth daily as needed.     pilocarpine (PILOCAR) 1 % ophthalmic solution Place 1 drop into the left eye 2 (two) times daily.      No facility-administered medications prior to visit.     Per HPI unless specifically indicated in ROS section below Review of Systems  Constitutional:  Positive for fatigue. Negative for activity change, appetite change, chills, fever and unexpected weight change.  HENT:  Negative for hearing loss.   Eyes:  Negative for visual disturbance.  Respiratory:  Negative for cough, chest tightness, shortness of breath and wheezing.   Cardiovascular:  Positive for leg swelling. Negative for chest pain and palpitations.  Gastrointestinal:  Negative for abdominal distention, abdominal pain, blood in stool, constipation, diarrhea, nausea and vomiting.  Endocrine: Negative for cold intolerance and heat intolerance.  Genitourinary:   Negative for difficulty urinating and hematuria.  Musculoskeletal:  Positive for arthralgias. Negative for myalgias and neck pain.  Skin:  Negative for rash.  Neurological:  Negative for dizziness, seizures, syncope and headaches.  Hematological:  Negative for adenopathy. Does not bruise/bleed easily.  Psychiatric/Behavioral:  Negative for dysphoric mood. The patient is not nervous/anxious.    Objective:  BP 130/78 (BP Location: Left Arm, Patient Position: Sitting, Cuff Size: Normal)   Pulse 67   Temp 98.1 F (36.7 C) (Temporal)   Ht 5\' 9"  (1.753 m)   Wt 195 lb (88.5 kg)   SpO2 98%   BMI 28.80 kg/m   Wt Readings from Last 3 Encounters:  12/21/20 195 lb (88.5 kg)  12/19/19 191 lb 3 oz (86.7 kg)  12/17/18 178 lb (80.7 kg)      Physical Exam Vitals and nursing note reviewed.  Constitutional:      General: He is not in acute distress.    Appearance: Normal appearance. He is well-developed. He is not ill-appearing.  HENT:     Head: Normocephalic and atraumatic.     Right Ear: Hearing, tympanic membrane, ear canal and external ear normal.     Left Ear: Hearing, tympanic membrane, ear canal and external ear normal.  Eyes:     General: No scleral icterus.    Extraocular Movements: Extraocular movements intact.     Conjunctiva/sclera: Conjunctivae normal.     Pupils: Pupils are equal, round, and reactive to light.  Neck:     Thyroid: No thyroid mass or thyromegaly.  Cardiovascular:     Rate and Rhythm: Normal rate and regular rhythm.     Pulses: Normal pulses.          Radial pulses are 2+ on the right side and 2+ on the left side.     Heart sounds: Normal heart sounds. No murmur heard. Pulmonary:     Effort: Pulmonary effort is normal. No respiratory distress.     Breath sounds: Normal breath sounds. No wheezing, rhonchi or rales.  Abdominal:     General: Bowel sounds are normal. There is no distension.     Palpations: Abdomen is soft. There is no mass.     Tenderness: There  is no abdominal tenderness. There is no guarding or rebound.     Hernia: No hernia is present.  Musculoskeletal:        General: Normal range of motion.     Cervical back: Normal range of motion and neck supple.     Right lower leg: No edema.     Left lower leg: No edema.     Comments:  Pain with int/ext rotation of hip in groin on right No significant pain with full flexion/extension of L knee  Lymphadenopathy:     Cervical: No cervical adenopathy.  Skin:    General: Skin is warm and dry.     Findings: No rash.  Neurological:     General: No focal deficit present.  Mental Status: He is alert and oriented to person, place, and time.     Comments:  Recall 3/3  Calculation 5/5 DLROW   Psychiatric:        Mood and Affect: Mood normal.        Behavior: Behavior normal.        Thought Content: Thought content normal.        Judgment: Judgment normal.      Results for orders placed or performed in visit on 12/08/20  IBC + Ferritin  Result Value Ref Range   Iron 135 42 - 165 ug/dL   Transferrin 440.1216.0 027.2212.0 - 360.0 mg/dL   Saturation Ratios 53.644.6 20.0 - 50.0 %   Ferritin 22.1 22.0 - 322.0 ng/mL  CBC  Result Value Ref Range   WBC 6.9 4.0 - 10.5 K/uL   RBC 4.90 4.22 - 5.81 Mil/uL   Platelets 248.0 150.0 - 400.0 K/uL   Hemoglobin 15.6 13.0 - 17.0 g/dL   HCT 64.444.8 03.439.0 - 74.252.0 %   MCV 91.3 78.0 - 100.0 fl   MCHC 34.9 30.0 - 36.0 g/dL   RDW 59.514.9 63.811.5 - 75.615.5 %    Assessment & Plan:  This visit occurred during the SARS-CoV-2 public health emergency.  Safety protocols were in place, including screening questions prior to the visit, additional usage of staff PPE, and extensive cleaning of exam room while observing appropriate contact time as indicated for disinfecting solutions.   Problem List Items Addressed This Visit     Retinitis pigmentosa    Continue yearly eye exam [name]([facility])       Medicare annual wellness visit, subsequent - Primary    I have personally reviewed the  Medicare Annual Wellness questionnaire and have noted 1. The patient's medical and social history 2. Their use of alcohol, tobacco or illicit drugs 3. Their current medications and supplements 4. The patient's functional ability including ADL's, fall risks, home safety risks and hearing or visual impairment. Cognitive function has been assessed and addressed as indicated.  5. Diet and physical activity 6. Evidence for depression or mood disorders The patients weight, height, BMI have been recorded in the chart. I have made referrals, counseling and provided education to the patient based on review of the above and I have provided the pt with a written personalized care plan for preventive services. Provider list updated.. See scanned questionairre as needed for further documentation. Reviewed preventative protocols and updated unless pt declined.        Iron overload    Continues Q4940mo phlebotomy through GI        Hereditary hemochromatosis (HCC)    Appreciate GI care.  Update labs.  Check INR with endorsed easy bleeding.        Advanced care planning/counseling discussion    Advanced directive - has not set up yet. would want brother and 2 sons to be HCPOA. Packet previously provided.        Health maintenance examination    Preventative protocols reviewed and updated unless pt declined. Discussed healthy diet and lifestyle.        Dyslipidemia    Update FLP off medications.        Relevant Orders   Lipid panel   Comprehensive metabolic panel   Subclinical hypothyroidism    Endorsing increasing hypothyroid symptoms - update TFTs.        Relevant Orders   TSH   T4, free   T3   Fatigue    Noticing icreasing  fatigue over the past year associated with non restorative sleep but not witnessed apnea.  ESS = 8 - will watch for now, await lab workup       Other Visit Diagnoses     Bleeding       Relevant Orders   Protime-INR   Special screening for malignant  neoplasm of prostate       Relevant Orders   PSA   Arthritis of right hip       Relevant Orders   Ambulatory referral to Orthopedic Surgery   Chronic pain of left knee       Relevant Orders   Ambulatory referral to Orthopedic Surgery        No orders of the defined types were placed in this encounter.  Orders Placed This Encounter  Procedures   Lipid panel   Comprehensive metabolic panel   TSH   T4, free   T3   PSA   Protime-INR   Ambulatory referral to Orthopedic Surgery    Referral Priority:   Routine    Referral Type:   Surgical    Referral Reason:   Specialty Services Required    Requested Specialty:   Orthopedic Surgery    Number of Visits Requested:   1     Patient instructions: Labs today  Work on advanced directive packet.  I agree follow up with orthopedist for possible R hip and L knee arthritis. New referral placed.  If interested, check with pharmacy about new 2 shot shingles series (shingrix).  Good to see you today. Return as needed or in 1 year for next wellness visit.   Follow up plan: Return in about 1 year (around 12/21/2021), or if symptoms worsen or fail to improve, for annual exam, prior fasting for blood work, medicare wellness visit.  Eustaquio Boyden, MD

## 2020-12-21 ENCOUNTER — Ambulatory Visit (INDEPENDENT_AMBULATORY_CARE_PROVIDER_SITE_OTHER): Payer: Medicare PPO | Admitting: Family Medicine

## 2020-12-21 ENCOUNTER — Other Ambulatory Visit: Payer: Self-pay

## 2020-12-21 ENCOUNTER — Encounter: Payer: Self-pay | Admitting: Family Medicine

## 2020-12-21 VITALS — BP 130/78 | HR 67 | Temp 98.1°F | Ht 69.0 in | Wt 195.0 lb

## 2020-12-21 DIAGNOSIS — G8929 Other chronic pain: Secondary | ICD-10-CM

## 2020-12-21 DIAGNOSIS — M1611 Unilateral primary osteoarthritis, right hip: Secondary | ICD-10-CM

## 2020-12-21 DIAGNOSIS — Z0000 Encounter for general adult medical examination without abnormal findings: Secondary | ICD-10-CM | POA: Diagnosis not present

## 2020-12-21 DIAGNOSIS — R5383 Other fatigue: Secondary | ICD-10-CM | POA: Insufficient documentation

## 2020-12-21 DIAGNOSIS — H3552 Pigmentary retinal dystrophy: Secondary | ICD-10-CM

## 2020-12-21 DIAGNOSIS — E038 Other specified hypothyroidism: Secondary | ICD-10-CM

## 2020-12-21 DIAGNOSIS — Z7189 Other specified counseling: Secondary | ICD-10-CM

## 2020-12-21 DIAGNOSIS — R58 Hemorrhage, not elsewhere classified: Secondary | ICD-10-CM

## 2020-12-21 DIAGNOSIS — Z125 Encounter for screening for malignant neoplasm of prostate: Secondary | ICD-10-CM | POA: Diagnosis not present

## 2020-12-21 DIAGNOSIS — M25562 Pain in left knee: Secondary | ICD-10-CM

## 2020-12-21 DIAGNOSIS — E785 Hyperlipidemia, unspecified: Secondary | ICD-10-CM

## 2020-12-21 LAB — LIPID PANEL
Cholesterol: 128 mg/dL (ref 0–200)
HDL: 51.7 mg/dL (ref >39.00)
LDL Cholesterol: 65 mg/dL (ref 0–99)
NonHDL: 76.15
Total CHOL/HDL Ratio: 2
Triglycerides: 58 mg/dL (ref 0.0–149.0)
VLDL: 11.6 mg/dL (ref 0.0–40.0)

## 2020-12-21 LAB — COMPREHENSIVE METABOLIC PANEL
ALT: 21 U/L (ref 0–53)
AST: 18 U/L (ref 0–37)
Albumin: 4.3 g/dL (ref 3.5–5.2)
Alkaline Phosphatase: 59 U/L (ref 39–117)
BUN: 15 mg/dL (ref 6–23)
CO2: 28 mEq/L (ref 19–32)
Calcium: 9.3 mg/dL (ref 8.4–10.5)
Chloride: 106 mEq/L (ref 96–112)
Creatinine, Ser: 0.8 mg/dL (ref 0.40–1.50)
GFR: 99.51 mL/min (ref >60.00)
Glucose, Bld: 102 mg/dL — ABNORMAL HIGH (ref 70–99)
Potassium: 4.3 mEq/L (ref 3.5–5.1)
Sodium: 141 mEq/L (ref 135–145)
Total Bilirubin: 1.1 mg/dL (ref 0.2–1.2)
Total Protein: 6.6 g/dL (ref 6.0–8.3)

## 2020-12-21 LAB — PROTIME-INR
INR: 1.1 ratio — ABNORMAL HIGH (ref 0.8–1.0)
Prothrombin Time: 12.1 s (ref 9.6–13.1)

## 2020-12-21 LAB — PSA: PSA: 0.19 ng/mL (ref 0.10–4.00)

## 2020-12-21 LAB — T3: T3, Total: 140 ng/dL (ref 76–181)

## 2020-12-21 LAB — TSH: TSH: 5.35 u[IU]/mL (ref 0.35–5.50)

## 2020-12-21 LAB — T4, FREE: Free T4: 0.77 ng/dL (ref 0.60–1.60)

## 2020-12-21 NOTE — Assessment & Plan Note (Signed)
Update FLP off medications.

## 2020-12-21 NOTE — Assessment & Plan Note (Signed)
Continues Q34mo phlebotomy through GI

## 2020-12-21 NOTE — Assessment & Plan Note (Signed)
Endorsing increasing hypothyroid symptoms - update TFTs.

## 2020-12-21 NOTE — Assessment & Plan Note (Signed)

## 2020-12-21 NOTE — Assessment & Plan Note (Signed)
Continue yearly eye exam Michael Vaughan)

## 2020-12-21 NOTE — Assessment & Plan Note (Addendum)
Noticing icreasing fatigue over the past year associated with non restorative sleep but not witnessed apnea.  ESS = 8 - will watch for now, await lab workup

## 2020-12-21 NOTE — Assessment & Plan Note (Signed)
Advanced directive - has not set up yet. would want brother and 2 sons to be HCPOA. Packet previously provided.

## 2020-12-21 NOTE — Assessment & Plan Note (Signed)
Preventative protocols reviewed and updated unless pt declined. Discussed healthy diet and lifestyle.  

## 2020-12-21 NOTE — Patient Instructions (Addendum)
Labs today  Work on Forensic scientist.  I agree follow up with orthopedist for possible R hip and L knee arthritis. New referral placed.  If interested, check with pharmacy about new 2 shot shingles series (shingrix).  Good to see you today. Return as needed or in 1 year for next wellness visit.   Health Maintenance, Male Adopting a healthy lifestyle and getting preventive care are important in promoting health and wellness. Ask your health care provider about: The right schedule for you to have regular tests and exams. Things you can do on your own to prevent diseases and keep yourself healthy. What should I know about diet, weight, and exercise? Eat a healthy diet  Eat a diet that includes plenty of vegetables, fruits, low-fat dairy products, and lean protein. Do not eat a lot of foods that are high in solid fats, added sugars, or sodium.  Maintain a healthy weight Body mass index (BMI) is a measurement that can be used to identify possible weight problems. It estimates body fat based on height and weight. Your health care provider can help determine your BMI and help you achieve or maintain ahealthy weight. Get regular exercise Get regular exercise. This is one of the most important things you can do for your health. Most adults should: Exercise for at least 150 minutes each week. The exercise should increase your heart rate and make you sweat (moderate-intensity exercise). Do strengthening exercises at least twice a week. This is in addition to the moderate-intensity exercise. Spend less time sitting. Even light physical activity can be beneficial. Watch cholesterol and blood lipids Have your blood tested for lipids and cholesterol at 56 years of age, then havethis test every 5 years. You may need to have your cholesterol levels checked more often if: Your lipid or cholesterol levels are high. You are older than 56 years of age. You are at high risk for heart disease. What  should I know about cancer screening? Many types of cancers can be detected early and may often be prevented. Depending on your health history and family history, you may need to have cancer screening at various ages. This may include screening for: Colorectal cancer. Prostate cancer. Skin cancer. Lung cancer. What should I know about heart disease, diabetes, and high blood pressure? Blood pressure and heart disease High blood pressure causes heart disease and increases the risk of stroke. This is more likely to develop in people who have high blood pressure readings, are of African descent, or are overweight. Talk with your health care provider about your target blood pressure readings. Have your blood pressure checked: Every 3-5 years if you are 31-51 years of age. Every year if you are 68 years old or older. If you are between the ages of 61 and 7 and are a current or former smoker, ask your health care provider if you should have a one-time screening for abdominal aortic aneurysm (AAA). Diabetes Have regular diabetes screenings. This checks your fasting blood sugar level. Have the screening done: Once every three years after age 48 if you are at a normal weight and have a low risk for diabetes. More often and at a younger age if you are overweight or have a high risk for diabetes. What should I know about preventing infection? Hepatitis B If you have a higher risk for hepatitis B, you should be screened for this virus. Talk with your health care provider to find out if you are at risk forhepatitis B infection.  Hepatitis C Blood testing is recommended for: Everyone born from [birth year] through 1966. Anyone with known risk factors for hepatitis C. Sexually transmitted infections (STIs) You should be screened each year for STIs, including gonorrhea and chlamydia, if: You are sexually active and are younger than 56 years of age. You are older than 56 years of age and your health care provider  tells you that you are at risk for this type of infection. Your sexual activity has changed since you were last screened, and you are at increased risk for chlamydia or gonorrhea. Ask your health care provider if you are at risk. Ask your health care provider about whether you are at high risk for HIV. Your health care provider may recommend a prescription medicine to help prevent HIV infection. If you choose to take medicine to prevent HIV, you should first get tested for HIV. You should then be tested every 3 months for as long as you are taking the medicine. Follow these instructions at home: Lifestyle Do not use any products that contain nicotine or tobacco, such as cigarettes, e-cigarettes, and chewing tobacco. If you need help quitting, ask your health care provider. Do not use street drugs. Do not share needles. Ask your health care provider for help if you need support or information about quitting drugs. Alcohol use Do not drink alcohol if your health care provider tells you not to drink. If you drink alcohol: Limit how much you have to 0-2 drinks a day. Be aware of how much alcohol is in your drink. In the [place] one drink equals one 12 oz bottle of beer (355 mL), one 5 oz glass of wine (148 mL), or one 1 oz glass of hard liquor (44 mL). General instructions Schedule regular health, dental, and eye exams. Stay current with your vaccines. Tell your health care provider if: You often feel depressed. You have ever been abused or do not feel safe at home. Summary Adopting a healthy lifestyle and getting preventive care are important in promoting health and wellness. Follow your health care provider's instructions about healthy diet, exercising, and getting tested or screened for diseases. Follow your health care provider's instructions on monitoring your cholesterol and blood pressure. This information is not intended to replace advice given to you by your health care provider. Make  sure you discuss any questions you have with your healthcare provider. Document Revised: 05/23/2018 Document Reviewed: 05/23/2018 Elsevier Patient Education  2022 [employer].

## 2020-12-21 NOTE — Assessment & Plan Note (Signed)
Appreciate GI care.  Update labs.  Check INR with endorsed easy bleeding.

## 2021-01-14 ENCOUNTER — Ambulatory Visit: Payer: Medicare PPO | Admitting: Surgery

## 2021-02-03 DIAGNOSIS — M25551 Pain in right hip: Secondary | ICD-10-CM | POA: Diagnosis not present

## 2021-02-03 DIAGNOSIS — M1611 Unilateral primary osteoarthritis, right hip: Secondary | ICD-10-CM | POA: Diagnosis not present

## 2021-02-23 ENCOUNTER — Telehealth: Payer: Self-pay

## 2021-02-23 NOTE — Telephone Encounter (Signed)
Received faxed pre-op clearance form from [facility] with [facility].  Surgery date, 04/27/21.  Plz schedule pre-op OV.   [Form is in basket on [employer].]

## 2021-03-03 ENCOUNTER — Encounter: Payer: Self-pay | Admitting: Family Medicine

## 2021-03-03 ENCOUNTER — Ambulatory Visit: Payer: Medicare PPO | Admitting: Family Medicine

## 2021-03-03 ENCOUNTER — Other Ambulatory Visit: Payer: Self-pay

## 2021-03-03 ENCOUNTER — Ambulatory Visit (INDEPENDENT_AMBULATORY_CARE_PROVIDER_SITE_OTHER)
Admission: RE | Admit: 2021-03-03 | Discharge: 2021-03-03 | Disposition: A | Discharge status: Home / Self Care | Payer: Medicare PPO | Source: Ambulatory Visit | Attending: Family Medicine | Admitting: Family Medicine

## 2021-03-03 VITALS — BP 120/78 | HR 78 | Temp 97.6°F | Ht 69.0 in | Wt 189.4 lb

## 2021-03-03 DIAGNOSIS — H3552 Pigmentary retinal dystrophy: Secondary | ICD-10-CM | POA: Diagnosis not present

## 2021-03-03 DIAGNOSIS — Z23 Encounter for immunization: Secondary | ICD-10-CM

## 2021-03-03 DIAGNOSIS — Z01818 Encounter for other preprocedural examination: Secondary | ICD-10-CM | POA: Diagnosis not present

## 2021-03-03 LAB — POCT GLYCOSYLATED HEMOGLOBIN (HGB A1C): Hemoglobin A1C: 4.9 % (ref 4.0–5.6)

## 2021-03-03 LAB — POC URINALSYSI DIPSTICK (AUTOMATED)
Bilirubin, UA: NEGATIVE
Blood, UA: NEGATIVE
Glucose, UA: NEGATIVE
Ketones, UA: NEGATIVE
Leukocytes, UA: NEGATIVE
Nitrite, UA: NEGATIVE
Protein, UA: NEGATIVE
Spec Grav, UA: 1.015 (ref 1.010–1.025)
Urobilinogen, UA: 0.2 E.U./dL
pH, UA: 5.5 (ref 5.0–8.0)

## 2021-03-03 NOTE — Assessment & Plan Note (Addendum)
RCRI = 0 Anticipate adequately low risk to proceed with planned intervention.  Check EKG, CXR, UA and POC A1c today. Too early to check other labs - will see if he can get drawn at upcoming GI lab draw/phlebotomy.  Will forward current results to Dr Nilsa Nutting office.

## 2021-03-03 NOTE — Patient Instructions (Addendum)
Flu shot today  A1c and urinalysis today for pre-operative evaluation.  Chest xray today.  Schedule lab visit over the next few weeks prior to surgery (see if [facility] can draw these labs, if not call us to schedule).  We will forward results to Dr Nilsa Nutting office.  I hope you have a speedy recovery!

## 2021-03-03 NOTE — Assessment & Plan Note (Signed)
Phlebotomy Q63mo through GI [name])

## 2021-03-03 NOTE — Assessment & Plan Note (Signed)
Does not drive.  °

## 2021-03-03 NOTE — Progress Notes (Signed)
Patient ID: Michael Vaughan, male    DOB: 11-08-1965, 55 y.o.   MRN: [id number]  This visit was conducted in person.  BP 120/78   Pulse 78   Temp 97.6 F (36.4 C) (Temporal)   Ht 5\' 9"  (1.753 m)   Wt 189 lb 7 oz (85.9 kg)   SpO2 97%   BMI 27.98 kg/m    CC: Preop Eval Subjective:   HPI: Michael Vaughan is a 55 y.o. male presenting on 03/03/2021 for Pre-op Exam (Right THA scheduled on 04/27/21.)   Upcoming R total hip replacement by Dr 04/29/21 scheduled for 04/27/2021 - debilitating activity limiting hip pain.   Known history of retinitis pigmentosa, hemochromatosis with iron overload receiving phlebotomy Q40mo through GI (next one scheduled 03/10/2021), borderline hypothyroidism off replacement.   Denies recent chest pain, tightness, dyspnea, dizziness, leg swelling, UTI symptoms, cough, fever/chills, nausea/diarrhea, headache, or vision changes.  Has previously tolerated anesthesia, latest GETA remotely for inguinal hernia repair at age 1yo.  Denies h/o postop nausea/vomiting or difficulty awakening from sleep.      Relevant past medical, surgical, family and social history reviewed and updated as indicated. Interim medical history since our last visit reviewed. Allergies and medications reviewed and updated. Outpatient Medications Prior to Visit  Medication Sig Dispense Refill   naproxen sodium (ANAPROX) 220 MG tablet Take 220 mg by mouth daily as needed.     pilocarpine (PILOCAR) 1 % ophthalmic solution Place 1 drop into the left eye 2 (two) times daily.      No facility-administered medications prior to visit.     Per HPI unless specifically indicated in ROS section below Review of Systems  Objective:  BP 120/78   Pulse 78   Temp 97.6 F (36.4 C) (Temporal)   Ht 5\' 9"  (1.753 m)   Wt 189 lb 7 oz (85.9 kg)   SpO2 97%   BMI 27.98 kg/m   Wt Readings from Last 3 Encounters:  03/03/21 189 lb 7 oz (85.9 kg)  12/21/20 195 lb (88.5 kg)  12/19/19 191 lb 3 oz (86.7 kg)       Physical Exam Vitals and nursing note reviewed.  Constitutional:      Appearance: Normal appearance. He is not ill-appearing.  HENT:     Head: Normocephalic and atraumatic.  Eyes:     Comments: Anisocoria   Cardiovascular:     Rate and Rhythm: Normal rate and regular rhythm.     Pulses: Normal pulses.     Heart sounds: Normal heart sounds. No murmur heard. Pulmonary:     Effort: Pulmonary effort is normal. No respiratory distress.     Breath sounds: Normal breath sounds. No wheezing, rhonchi or rales.  Musculoskeletal:     Right lower leg: No edema.     Left lower leg: No edema.  Skin:    General: Skin is warm and dry.     Findings: No rash.  Neurological:     Mental Status: He is alert.  Psychiatric:        Mood and Affect: Mood normal.        Behavior: Behavior normal.      Results for orders placed or performed in visit on 03/03/21  POCT glycosylated hemoglobin (Hb A1C)  Result Value Ref Range   Hemoglobin A1C 4.9 4.0 - 5.6 %   HbA1c POC (<> result, manual entry)     HbA1c, POC (prediabetic range)     HbA1c, POC (controlled diabetic range)  POCT Urinalysis Dipstick (Automated)  Result Value Ref Range   Color, UA dark yellow    Clarity, UA clear    Glucose, UA Negative Negative   Bilirubin, UA negative    Ketones, UA negative    Spec Grav, UA 1.015 1.010 - 1.025   Blood, UA negative    pH, UA 5.5 5.0 - 8.0   Protein, UA Negative Negative   Urobilinogen, UA 0.2 0.2 or 1.0 E.U./dL   Nitrite, UA negative    Leukocytes, UA Negative Negative   Lab Results  Component Value Date   WBC 6.9 12/08/2020   HGB 15.6 12/08/2020   HCT 44.8 12/08/2020   MCV 91.3 12/08/2020   PLT 248.0 12/08/2020    Lab Results  Component Value Date   CREATININE 0.80 12/21/2020   BUN 15 12/21/2020   NA 141 12/21/2020   K 4.3 12/21/2020   CL 106 12/21/2020   CO2 28 12/21/2020    EKG - NSR rate 70s, normal axis, intervals, no acute ST/T changes.   X-ray chest PA and  lateral CLINICAL DATA:  Preoperative ophthalmic surgery  EXAM: CHEST  2 VIEW  COMPARISON:  January 24, 2012  FINDINGS: There is no edema or consolidation. Heart size and pulmonary vascularity are within normal limits. No adenopathy. No bone lesions.  IMPRESSION: No edema or consolidation.  Electronically Signed   By: Bretta Bang [name]   On: 01/14/2014 10:00   Assessment & Plan:  This visit occurred during the SARS-CoV-2 public health emergency.  Safety protocols were in place, including screening questions prior to the visit, additional usage of staff PPE, and extensive cleaning of exam room while observing appropriate contact time as indicated for disinfecting solutions.   Problem List Items Addressed This Visit     Retinitis pigmentosa    Does not drive.       Iron overload    Phlebotomy Q82mo through GI [name])      Hereditary hemochromatosis (HCC)   Pre-op evaluation - Primary    RCRI = 0 Anticipate adequately low risk to proceed with planned intervention.  Check EKG, CXR, UA and POC A1c today. Too early to check other labs - will see if he can get drawn at upcoming GI lab draw/phlebotomy.  Will forward current results to Dr Nilsa Nutting office.      Relevant Orders   EKG 12-Lead (Completed)   POCT glycosylated hemoglobin (Hb A1C) (Completed)   POCT Urinalysis Dipstick (Automated) (Completed)   DG Chest 2 View   Comprehensive metabolic panel   Protime-INR   Other Visit Diagnoses     Need for influenza vaccination       Relevant Orders   Flu Vaccine QUAD 81mo+IM (Fluarix, Fluzone & Alfiuria Quad PF) (Completed)        No orders of the defined types were placed in this encounter.  Orders Placed This Encounter  Procedures   DG Chest 2 View    Standing Status:   Future    Number of Occurrences:   1    Standing Expiration Date:   03/03/2022    Order Specific Question:   Reason for Exam (SYMPTOM  OR DIAGNOSIS REQUIRED)    Answer:   preop eval    Order  Specific Question:   Preferred imaging location?    Answer:   [facility]   Flu Vaccine QUAD 59mo+IM (Fluarix, Fluzone & Alfiuria Quad PF)   Comprehensive metabolic panel    Standing Status:   Future  Standing Expiration Date:   03/03/2022   Protime-INR    Standing Status:   Future    Standing Expiration Date:   03/03/2022   POCT glycosylated hemoglobin (Hb A1C)   POCT Urinalysis Dipstick (Automated)   EKG 12-Lead     Patient Instructions  Flu shot today  A1c and urinalysis today for pre-operative evaluation.  Chest xray today.  Schedule lab visit over the next few weeks prior to surgery (see if [facility] can draw these labs, if not call us to schedule).  We will forward results to Dr Nilsa Nutting office.  I hope you have a speedy recovery!  Follow up plan: Return if symptoms worsen or fail to improve.  Eustaquio Boyden, MD

## 2021-03-05 NOTE — Telephone Encounter (Signed)
Faxed form to [facility] at [phone], attn:  Rosalva Ferron.

## 2021-03-12 ENCOUNTER — Other Ambulatory Visit (INDEPENDENT_AMBULATORY_CARE_PROVIDER_SITE_OTHER): Payer: Medicare PPO

## 2021-03-12 DIAGNOSIS — Z01818 Encounter for other preprocedural examination: Secondary | ICD-10-CM | POA: Diagnosis not present

## 2021-03-12 LAB — CBC
HCT: 42.5 % (ref 39.0–52.0)
Hemoglobin: 14.5 g/dL (ref 13.0–17.0)
MCHC: 34.2 g/dL (ref 30.0–36.0)
MCV: 92.4 fl (ref 78.0–100.0)
Platelets: 214 10*3/uL (ref 150.0–400.0)
RBC: 4.6 Mil/uL (ref 4.22–5.81)
RDW: 15.1 % (ref 11.5–15.5)
WBC: 5.8 10*3/uL (ref 4.0–10.5)

## 2021-03-12 LAB — IBC + FERRITIN
Ferritin: 17 ng/mL — ABNORMAL LOW (ref 22.0–322.0)
Iron: 44 ug/dL (ref 42–165)
Saturation Ratios: 16.2 % — ABNORMAL LOW (ref 20.0–50.0)
TIBC: 271.6 ug/dL (ref 250.0–450.0)
Transferrin: 194 mg/dL — ABNORMAL LOW (ref 212.0–360.0)

## 2021-03-12 LAB — COMPREHENSIVE METABOLIC PANEL
ALT: 28 U/L (ref 0–53)
AST: 24 U/L (ref 0–37)
Albumin: 3.9 g/dL (ref 3.5–5.2)
Alkaline Phosphatase: 61 U/L (ref 39–117)
BUN: 13 mg/dL (ref 6–23)
CO2: 28 mEq/L (ref 19–32)
Calcium: 8.9 mg/dL (ref 8.4–10.5)
Chloride: 105 mEq/L (ref 96–112)
Creatinine, Ser: 0.9 mg/dL (ref 0.40–1.50)
GFR: 95.89 mL/min (ref >60.00)
Glucose, Bld: 103 mg/dL — ABNORMAL HIGH (ref 70–99)
Potassium: 3.5 mEq/L (ref 3.5–5.1)
Sodium: 141 mEq/L (ref 135–145)
Total Bilirubin: 1.1 mg/dL (ref 0.2–1.2)
Total Protein: 6.3 g/dL (ref 6.0–8.3)

## 2021-03-12 LAB — PROTIME-INR
INR: 1.2 ratio — ABNORMAL HIGH (ref 0.8–1.0)
Prothrombin Time: 12.6 s (ref 9.6–13.1)

## 2021-03-17 ENCOUNTER — Other Ambulatory Visit: Payer: Self-pay

## 2021-04-15 NOTE — Patient Instructions (Signed)
DUE TO COVID-19 ONLY ONE VISITOR IS ALLOWED TO COME WITH YOU AND STAY IN THE WAITING ROOM ONLY DURING PRE OP AND PROCEDURE.   **NO VISITORS ARE ALLOWED IN THE SHORT STAY AREA OR RECOVERY ROOM!!**  IF YOU WILL BE ADMITTED INTO THE HOSPITAL YOU ARE ALLOWED ONLY TWO SUPPORT PEOPLE DURING VISITATION HOURS ONLY (7 AM -8PM)   The support person(s) must pass our screening, gel in and out, and wear a mask at all times, including in the patient's room. Patients must also wear a mask when staff or their support person are in the room. Visitors GUEST BADGE MUST BE WORN VISIBLY  One adult visitor may remain with you overnight and MUST be in the room by 8 P.M.  No visitors under the age of 36. Any visitor under the age of 23 must be accompanied by an adult.    COVID SWAB TESTING MUST BE COMPLETED ON:  04/23/21 **MUST PRESENT COMPLETED FORM AT TESTING SITE**    [address] [place] (backside of the building) You are not required to quarantine, however you are required to wear a well-fitted mask when you are out and around people not in your household.  Hand Hygiene often Do NOT share personal items Notify your provider if you are in close contact with someone who has COVID or you develop fever 100.4 or greater, new onset of sneezing, cough, sore throat, shortness of breath or body aches.  [facility] Entrance [address], [address], must go inside of the hospital, NOT A DRIVE THRU!  (Must self quarantine after testing. Follow instructions on handout.)       Your procedure is scheduled on: 04/27/21   Report to [facility] Main Entrance    Report to admitting at: 6:00 AM   Call this number if you have problems the morning of surgery [phone]   Do not eat food :After Midnight.   May have liquids until: 5:30 AM    day of surgery  CLEAR LIQUID DIET  Foods Allowed                                                                      Foods Excluded  Water, Black Coffee and tea, regular and decaf                             liquids that you cannot  Plain Jell-O in any flavor  (No red)                                           see through such as: Fruit ices (not with fruit pulp)                                     milk, soups, orange juice              Iced Popsicles (No red)  All solid food                                   Apple juices Sports drinks like Gatorade (No red) Lightly seasoned clear broth or consume(fat free) Sugar  Sample Menu Breakfast                                Lunch                                     Supper Cranberry juice                    Beef broth                            Chicken broth Jell-O                                     Grape juice                           Apple juice Coffee or tea                        Jell-O                                      Popsicle                                                Coffee or tea                        Coffee or tea      Complete one Ensure drink the morning of surgery at : 5:30 AM      the day of surgery.   The day of surgery:  Drink ONE (1) Pre-Surgery Clear Ensure or G2 by am the morning of surgery. Drink in one sitting. Do not sip.  This drink was given to you during your hospital  pre-op appointment visit. Nothing else to drink after completing the  Pre-Surgery Clear Ensure or G2.          If you have questions, please contact your surgeon's office.     Oral Hygiene is also important to reduce your risk of infection.                                    Remember - BRUSH YOUR TEETH THE MORNING OF SURGERY WITH YOUR REGULAR TOOTHPASTE   Do NOT smoke after Midnight   Take these medicines the morning of surgery with A SIP OF WATER: N/A  DO NOT TAKE ANY ORAL DIABETIC MEDICATIONS DAY OF YOUR SURGERY  You may not have any metal on your body including hair pins, jewelry, and  body piercing             Do not wear lotions, powders, perfumes/cologne, or deodorant              Men may shave face and neck.   Do not bring valuables to the hospital. [facility] IS NOT             RESPONSIBLE   FOR VALUABLES.   Contacts, dentures or bridgework may not be worn into surgery.   Bring small overnight bag day of surgery.    Patients discharged on the day of surgery will not be allowed to drive home.   Special Instructions: Bring a copy of your healthcare power of attorney and living will documents         the day of surgery if you haven't scanned them before.              Please read over the following fact sheets you were given: IF YOU HAVE QUESTIONS ABOUT YOUR PRE-OP INSTRUCTIONS PLEASE CALL [phone]     [facility] - Preparing for Surgery Before surgery, you can play an important role.  Because skin is not sterile, your skin needs to be as free of germs as possible.  You can reduce the number of germs on your skin by washing with CHG (chlorahexidine gluconate) soap before surgery.  CHG is an antiseptic cleaner which kills germs and bonds with the skin to continue killing germs even after washing. Please DO NOT use if you have an allergy to CHG or antibacterial soaps.  If your skin becomes reddened/irritated stop using the CHG and inform your nurse when you arrive at Short Stay. Do not shave (including legs and underarms) for at least 48 hours prior to the first CHG shower.  You may shave your face/neck. Please follow these instructions carefully:  1.  Shower with CHG Soap the night before surgery and the  morning of Surgery.  2.  If you choose to wash your hair, wash your hair first as usual with your  normal  shampoo.  3.  After you shampoo, rinse your hair and body thoroughly to remove the  shampoo.                           4.  Use CHG as you would any other liquid soap.  You can apply chg directly  to the skin and wash                       Gently with a  scrungie or clean washcloth.  5.  Apply the CHG Soap to your body ONLY FROM THE NECK DOWN.   Do not use on face/ open                           Wound or open sores. Avoid contact with eyes, ears mouth and genitals (private parts).                       Wash face,  Genitals (private parts) with your normal soap.             6.  Wash thoroughly, paying special attention to the area where your surgery  will be performed.  7.  Thoroughly rinse your body with warm  water from the neck down.  8.  DO NOT shower/wash with your normal soap after using and rinsing off  the CHG Soap.                9.  Pat yourself dry with a clean towel.            10.  Wear clean pajamas.            11.  Place clean sheets on your bed the night of your first shower and do not  sleep with pets. Day of Surgery : Do not apply any lotions/deodorants the morning of surgery.  Please wear clean clothes to the hospital/surgery center.  FAILURE TO FOLLOW THESE INSTRUCTIONS MAY RESULT IN THE CANCELLATION OF YOUR SURGERY PATIENT SIGNATURE_________________________________  NURSE SIGNATURE__________________________________  ________________________________________________________________________   Michael Vaughan  An incentive spirometer is a tool that can help keep your lungs clear and active. This tool measures how well you are filling your lungs with each breath. Taking long deep breaths may help reverse or decrease the chance of developing breathing (pulmonary) problems (especially infection) following: A long period of time when you are unable to move or be active. BEFORE THE PROCEDURE  If the spirometer includes an indicator to show your best effort, your nurse or respiratory therapist will set it to a desired goal. If possible, sit up straight or lean slightly forward. Try not to slouch. Hold the incentive spirometer in an upright position. INSTRUCTIONS FOR USE  Sit on the edge of your bed if possible, or sit up as  far as you can in bed or on a chair. Hold the incentive spirometer in an upright position. Breathe out normally. Place the mouthpiece in your mouth and seal your lips tightly around it. Breathe in slowly and as deeply as possible, raising the piston or the ball toward the top of the column. Hold your breath for 3-5 seconds or for as long as possible. Allow the piston or ball to fall to the bottom of the column. Remove the mouthpiece from your mouth and breathe out normally. Rest for a few seconds and repeat Steps 1 through 7 at least 10 times every 1-2 hours when you are awake. Take your time and take a few normal breaths between deep breaths. The spirometer may include an indicator to show your best effort. Use the indicator as a goal to work toward during each repetition. After each set of 10 deep breaths, practice coughing to be sure your lungs are clear. If you have an incision (the cut made at the time of surgery), support your incision when coughing by placing a pillow or rolled up towels firmly against it. Once you are able to get out of bed, walk around indoors and cough well. You may stop using the incentive spirometer when instructed by your caregiver.  RISKS AND COMPLICATIONS Take your time so you do not get dizzy or light-headed. If you are in pain, you may need to take or ask for pain medication before doing incentive spirometry. It is harder to take a deep breath if you are having pain. AFTER USE Rest and breathe slowly and easily. It can be helpful to keep track of a log of your progress. Your caregiver can provide you with a simple table to help with this. If you are using the spirometer at home, follow these instructions: SEEK MEDICAL CARE IF:  You are having difficultly using the spirometer. You have trouble using the spirometer as  often as instructed. Your pain medication is not giving enough relief while using the spirometer. You develop fever of 100.5 F (38.1 C) or  higher. SEEK IMMEDIATE MEDICAL CARE IF:  You cough up bloody sputum that had not been present before. You develop fever of 102 F (38.9 C) or greater. You develop worsening pain at or near the incision site. MAKE SURE YOU:  Understand these instructions. Will watch your condition. Will get help right away if you are not doing well or get worse. Document Released: 10/10/2006 Document Revised: 08/22/2011 Document Reviewed: 12/11/2006 [facility] Patient Information 2014 [place], [place].   ________________________________________________________________________

## 2021-04-16 ENCOUNTER — Encounter (HOSPITAL_COMMUNITY): Payer: Self-pay

## 2021-04-16 ENCOUNTER — Encounter (HOSPITAL_COMMUNITY)
Admission: RE | Admit: 2021-04-16 | Discharge: 2021-04-16 | Disposition: A | Discharge status: Home / Self Care | Payer: Medicare PPO | Source: Ambulatory Visit | Attending: Orthopedic Surgery | Admitting: Orthopedic Surgery

## 2021-04-16 ENCOUNTER — Other Ambulatory Visit: Payer: Self-pay

## 2021-04-16 VITALS — BP 164/89 | HR 74 | Temp 98.1°F | Ht 69.0 in | Wt 189.6 lb

## 2021-04-16 DIAGNOSIS — Z01818 Encounter for other preprocedural examination: Secondary | ICD-10-CM

## 2021-04-16 DIAGNOSIS — Z01812 Encounter for preprocedural laboratory examination: Secondary | ICD-10-CM | POA: Insufficient documentation

## 2021-04-16 LAB — CBC
HCT: 42.8 % (ref 39.0–52.0)
Hemoglobin: 14.9 g/dL (ref 13.0–17.0)
MCH: 31.6 pg (ref 26.0–34.0)
MCHC: 34.8 g/dL (ref 30.0–36.0)
MCV: 90.9 fL (ref 80.0–100.0)
Platelets: 264 10*3/uL (ref 150–400)
RBC: 4.71 MIL/uL (ref 4.22–5.81)
RDW: 13.2 % (ref 11.5–15.5)
WBC: 7.1 10*3/uL (ref 4.0–10.5)
nRBC: 0 % (ref 0.0–0.2)

## 2021-04-16 LAB — COMPREHENSIVE METABOLIC PANEL
ALT: 26 U/L (ref 0–44)
AST: 28 U/L (ref 15–41)
Albumin: 4.2 g/dL (ref 3.5–5.0)
Alkaline Phosphatase: 71 U/L (ref 38–126)
Anion gap: 7 (ref 5–15)
BUN: 11 mg/dL (ref 6–20)
CO2: 26 mmol/L (ref 22–32)
Calcium: 8.9 mg/dL (ref 8.9–10.3)
Chloride: 105 mmol/L (ref 98–111)
Creatinine, Ser: 0.79 mg/dL (ref 0.61–1.24)
GFR, Estimated: >60 mL/min (ref >60)
Glucose, Bld: 99 mg/dL (ref 70–99)
Potassium: 4.2 mmol/L (ref 3.5–5.1)
Sodium: 138 mmol/L (ref 135–145)
Total Bilirubin: 0.9 mg/dL (ref 0.3–1.2)
Total Protein: 6.9 g/dL (ref 6.5–8.1)

## 2021-04-16 LAB — SURGICAL PCR SCREEN
MRSA, PCR: NEGATIVE
Staphylococcus aureus: NEGATIVE

## 2021-04-16 LAB — TYPE AND SCREEN
ABO/RH(D): O POS
Antibody Screen: NEGATIVE

## 2021-04-16 NOTE — Progress Notes (Signed)
COVID Vaccine Completed: Yes Date COVID Vaccine completed: 07/06/20 COVID vaccine manufacturer: Pfizer  x 3  PCP - Dr. Eustaquio Boyden. : Clearance: 03/05/21 : Chart Cardiologist -   Chest x-ray - 03/08/21 EKG - 03/03/21 : Chart Stress Test -  ECHO -  Cardiac Cath -  Pacemaker/ICD device last checked:  Sleep Study -  CPAP -   Fasting Blood Sugar -  Checks Blood Sugar _____ times a day  Blood Thinner Instructions: Aspirin Instructions: Last Dose:  Anesthesia review:   Patient denies shortness of breath, fever, cough and chest pain at PAT appointment   Patient verbalized understanding of instructions that were given to them at the PAT appointment. Patient was also instructed that they will need to review over the PAT instructions again at home before surgery.

## 2021-04-22 ENCOUNTER — Telehealth: Payer: Self-pay

## 2021-04-22 ENCOUNTER — Telehealth: Payer: Self-pay | Admitting: Hematology and Oncology

## 2021-04-22 NOTE — H&P (Signed)
TOTAL HIP ADMISSION H&P  Patient is admitted for right total hip arthroplasty.  Subjective:  Chief Complaint: right hip pain  HPI: Michael Vaughan, 55 y.o. male, has a history of pain and functional disability in the right hip(s) due to arthritis and patient has failed non-surgical conservative treatments for greater than 12 weeks to include NSAID's and/or analgesics, corticosteriod injections, and activity modification.  Onset of symptoms was gradual starting 2 years ago with gradually worsening course since that time.The patient noted no past surgery on the right hip(s).  Patient currently rates pain in the right hip at 9 out of 10 with activity. Patient has worsening of pain with activity and weight bearing, pain that interfers with activities of daily living, and pain with passive range of motion. Patient has evidence of joint space narrowing by imaging studies. This condition presents safety issues increasing the risk of falls.  There is no current active infection.  Patient Active Problem List   Diagnosis Date Noted   Pre-op evaluation 03/03/2021   Fatigue 12/21/2020   Subclinical hypothyroidism 12/06/2017   Dyslipidemia 11/02/2016   Muscle cramping 11/05/2015   Advanced care planning/counseling discussion 09/10/2014   Health maintenance examination 09/10/2014   Iron overload 05/13/2013   Hereditary hemochromatosis (HCC) 05/13/2013   Medicare annual wellness visit, subsequent 05/24/2012   Arthropathy of both hands    Retinitis pigmentosa    Past Medical History:  Diagnosis Date   Allergy    Arthralgia    takes aleve regularly   Arthritis    Cataract    Heartburn    Hereditary hemochromatosis (HCC) 05/2013   iron overload with mild HM, homozygous for C282Y, monthly blood draws   History of chicken pox    History of migraine 1990s   remote   Retinitis pigmentosa     Past Surgical History:  Procedure Laterality Date   CATARACT EXTRACTION BILATERAL W/ ANTERIOR VITRECTOMY  Bilateral 2001   COLONOSCOPY  12/2016   WNL ([facility])   INGUINAL HERNIA REPAIR  1972   at age 19   IRIDECTOMY Right 01/14/2014   Procedure: IRIDECTOMY;  Surgeon: Sherrie George, MD;  Location: [facility] OR;  Service: Ophthalmology;  Laterality: Right;   PARS PLANA VITRECTOMY  01/24/2012   Procedure: PARS PLANA VITRECTOMY WITH 25G REMOVAL/SUTURE INTRAOCULAR LENS;  Surgeon: Sherrie George, MD;  Location: [facility] OR;  Service: Ophthalmology;  Laterality: Left;   PARS PLANA VITRECTOMY Right 01/14/2014   dislocated intraocular lens - PARS PLANA VITRECTOMY WITH 25G REMOVAL/SUTURE SECONDARY INTRAOCULAR LENS;  Sherrie George, MD   PHOTOCOAGULATION WITH LASER Right 01/14/2014   Procedure: PHOTOCOAGULATION WITH LASER;  Surgeon: Sherrie George, MD;  Location: [facility] OR;  Service: Ophthalmology;  Laterality: Right;    No current facility-administered medications for this encounter.   Current Outpatient Medications  Medication Sig Dispense Refill Last Dose   ibuprofen (ADVIL) 200 MG tablet Take 800 mg by mouth daily as needed for moderate pain.      naproxen sodium (ANAPROX) 220 MG tablet Take 440 mg by mouth daily as needed (pain).      pilocarpine (PILOCAR) 1 % ophthalmic solution Place 1 drop into the left eye 2 (two) times daily.       Allergies  Allergen Reactions   Augmentin [Amoxicillin-Pot Clavulanate] Shortness Of Breath, Swelling and Rash    No problems with amox    Social History   Tobacco Use   Smoking status: Former    Packs/day: 1.00    Types: Cigarettes  Quit date: 03/12/2004    Years since quitting: 17.1   Smokeless tobacco: Current    Types: Snuff    Last attempt to quit: 10/12/2015   Tobacco comments:    quit in 2005, occasional smokeless tobacco - now using the [employer] tobacco free snuff  Substance Use Topics   Alcohol use: Yes    Alcohol/week: 0.0 standard drinks    Comment: rarely    Family History  Problem Relation Age of Onset   Colon cancer Paternal Aunt 6   Leukemia Father         CLL spread to brain   Hypertension Father        and mother and family   Breast cancer Paternal Aunt    Prostate cancer Brother 7   Hyperlipidemia Other        father's side   CAD Paternal Uncle        MI, CABG   Stroke Maternal Grandmother    Diabetes Other        mother's side   Deep vein thrombosis Brother        with PE   Stroke Maternal Grandfather    Hypertension Mother    Esophageal cancer Neg Hx    Liver cancer Neg Hx    Pancreatic cancer Neg Hx    Rectal cancer Neg Hx    Stomach cancer Neg Hx      Review of Systems  Constitutional:  Negative for chills and fever.  Respiratory:  Negative for cough and shortness of breath.   Cardiovascular:  Negative for chest pain.  Gastrointestinal:  Negative for nausea and vomiting.  Musculoskeletal:  Positive for arthralgias.    Objective:  Physical Exam Well nourished and well developed. General: Alert and oriented x3, cooperative and pleasant, no acute distress. Head: normocephalic, atraumatic, neck supple. Eyes: EOMI.  Musculoskeletal: Right hip exam: Painful and limited hip flexion internal rotation to 5 to 10 degrees with pelvic tilting External rotation is to 20 degrees Mild tenderness Neurovascular intact distally Left hip exam does not reproduce significant groin pain with hip flexion and internal rotation over 10 degrees with external rotation of 30 degrees  Calves soft and nontender. Motor function intact in LE. Strength 5/5 LE bilaterally. Neuro: Distal pulses 2+. Sensation to light touch intact in LE.  Vital signs in last 24 hours:    Labs:   Estimated body mass index is 28 kg/m as calculated from the following:   Height as of 04/16/21: 5\' 9"  (1.753 m).   Weight as of 04/16/21: 86 kg.   Imaging Review Plain radiographs demonstrate severe degenerative joint disease of the right hip(s). The bone quality appears to be adequate for age and reported activity  level.      Assessment/Plan:  End stage arthritis, right hip(s)  The patient history, physical examination, clinical judgement of the provider and imaging studies are consistent with end stage degenerative joint disease of the right hip(s) and total hip arthroplasty is deemed medically necessary. The treatment options including medical management, injection therapy, arthroscopy and arthroplasty were discussed at length. The risks and benefits of total hip arthroplasty were presented and reviewed. The risks due to aseptic loosening, infection, stiffness, dislocation/subluxation,  thromboembolic complications and other imponderables were discussed.  The patient acknowledged the explanation, agreed to proceed with the plan and consent was signed. Patient is being admitted for inpatient treatment for surgery, pain control, PT, OT, prophylactic antibiotics, VTE prophylaxis, progressive ambulation and ADL's and discharge planning.The patient  is planning to be discharged  home.   Therapy Plans: HEP Disposition: home with son Planned DVT Prophylaxis: aspirin 81mg  BID DME needed: walker, 3-n-1 PCP: , clearance received TXA: IV Allergies: Augmentin - difficulty breathing, itching (tolerates amoxicillin) Anesthesia Concerns: none BMI: 27.7 Last HgbA1c: Not diabetic.  Other: - Hydrocodone, robaxin, tylenol, celebrex  Clinton Gallant, PA-C Orthopedic Surgery [facility] [place] [phone]

## 2021-04-22 NOTE — Telephone Encounter (Signed)
Scheduled appt per11/10 referral. Pt is aware of appt date and time. Pt requested to schedule appt in January.

## 2021-04-22 NOTE — Telephone Encounter (Signed)
Patient notified that [facility] will no longer be performing therapeutic phlebotomy.  He is notified Dr. [name] recommended a referral to hematology to help manage. He understands he will be contacted directly with an  appointment date and time for a consult

## 2021-04-22 NOTE — Telephone Encounter (Signed)
Patient

## 2021-04-23 ENCOUNTER — Other Ambulatory Visit: Payer: Self-pay | Admitting: Orthopedic Surgery

## 2021-04-23 LAB — SARS CORONAVIRUS 2 (TAT 6-24 HRS): SARS Coronavirus 2: NEGATIVE

## 2021-04-26 ENCOUNTER — Encounter (HOSPITAL_COMMUNITY): Payer: Self-pay | Admitting: Orthopedic Surgery

## 2021-04-27 ENCOUNTER — Ambulatory Visit (HOSPITAL_COMMUNITY): Payer: Medicare PPO | Admitting: Anesthesiology

## 2021-04-27 ENCOUNTER — Observation Stay (HOSPITAL_COMMUNITY)
Admission: RE | Admit: 2021-04-27 | Discharge: 2021-04-28 | Disposition: A | Discharge status: Home / Self Care | Payer: Medicare PPO | Attending: Orthopedic Surgery | Admitting: Orthopedic Surgery

## 2021-04-27 ENCOUNTER — Encounter (HOSPITAL_COMMUNITY): Payer: Self-pay | Admitting: Orthopedic Surgery

## 2021-04-27 ENCOUNTER — Other Ambulatory Visit: Payer: Self-pay

## 2021-04-27 ENCOUNTER — Ambulatory Visit (HOSPITAL_COMMUNITY): Payer: Medicare PPO

## 2021-04-27 ENCOUNTER — Observation Stay (HOSPITAL_COMMUNITY): Payer: Medicare PPO

## 2021-04-27 ENCOUNTER — Encounter (HOSPITAL_COMMUNITY)
Admission: RE | Disposition: A | Discharge status: Home / Self Care | Payer: Self-pay | Source: Home / Self Care | Attending: Orthopedic Surgery

## 2021-04-27 DIAGNOSIS — Z96649 Presence of unspecified artificial hip joint: Secondary | ICD-10-CM

## 2021-04-27 DIAGNOSIS — E02 Subclinical iodine-deficiency hypothyroidism: Secondary | ICD-10-CM | POA: Diagnosis not present

## 2021-04-27 DIAGNOSIS — Z87891 Personal history of nicotine dependence: Secondary | ICD-10-CM | POA: Insufficient documentation

## 2021-04-27 DIAGNOSIS — M1611 Unilateral primary osteoarthritis, right hip: Secondary | ICD-10-CM | POA: Diagnosis not present

## 2021-04-27 DIAGNOSIS — E039 Hypothyroidism, unspecified: Secondary | ICD-10-CM | POA: Insufficient documentation

## 2021-04-27 DIAGNOSIS — Z96641 Presence of right artificial hip joint: Secondary | ICD-10-CM | POA: Diagnosis not present

## 2021-04-27 DIAGNOSIS — Z471 Aftercare following joint replacement surgery: Secondary | ICD-10-CM | POA: Diagnosis not present

## 2021-04-27 DIAGNOSIS — E785 Hyperlipidemia, unspecified: Secondary | ICD-10-CM | POA: Diagnosis not present

## 2021-04-27 DIAGNOSIS — H3552 Pigmentary retinal dystrophy: Secondary | ICD-10-CM | POA: Diagnosis not present

## 2021-04-27 DIAGNOSIS — Z419 Encounter for procedure for purposes other than remedying health state, unspecified: Secondary | ICD-10-CM

## 2021-04-27 HISTORY — PX: TOTAL HIP ARTHROPLASTY: SHX124

## 2021-04-27 LAB — ABO/RH: ABO/RH(D): O POS

## 2021-04-27 SURGERY — ARTHROPLASTY, HIP, TOTAL, ANTERIOR APPROACH
Anesthesia: Spinal | Site: Hip | Laterality: Right

## 2021-04-27 MED ORDER — METOCLOPRAMIDE HCL 5 MG/ML IJ SOLN: 5.0000 mg | Freq: Three times a day (TID) | INTRAMUSCULAR | Status: DC | PRN

## 2021-04-27 MED ORDER — DEXAMETHASONE SODIUM PHOSPHATE 10 MG/ML IJ SOLN
10.0000 mg | Freq: Once | INTRAMUSCULAR | Status: AC
  Administered 2021-04-28: 10 mg via INTRAVENOUS
  Filled 2021-04-27: qty 1

## 2021-04-27 MED ORDER — ORAL CARE MOUTH RINSE
15.0000 mL | Freq: Once | OROMUCOSAL | Status: AC
  Administered 2021-04-27: 15 mL via OROMUCOSAL

## 2021-04-27 MED ORDER — HYDROMORPHONE HCL 1 MG/ML IJ SOLN: 0.2500 mg | INTRAMUSCULAR | Status: DC | PRN

## 2021-04-27 MED ORDER — METOCLOPRAMIDE HCL 5 MG PO TABS: 5.0000 mg | ORAL_TABLET | Freq: Three times a day (TID) | ORAL | Status: DC | PRN

## 2021-04-27 MED ORDER — MENTHOL 3 MG MT LOZG: 1.0000 | LOZENGE | OROMUCOSAL | Status: DC | PRN

## 2021-04-27 MED ORDER — ESMOLOL HCL 100 MG/10ML IV SOLN
INTRAVENOUS | Status: AC
  Filled 2021-04-27: qty 10

## 2021-04-27 MED ORDER — MEPERIDINE HCL 50 MG/ML IJ SOLN: 6.2500 mg | INTRAMUSCULAR | Status: DC | PRN

## 2021-04-27 MED ORDER — MORPHINE SULFATE (PF) 4 MG/ML IV SOLN
0.5000 mg | INTRAVENOUS | Status: DC | PRN
  Administered 2021-04-27: 1 mg via INTRAVENOUS

## 2021-04-27 MED ORDER — DOCUSATE SODIUM 100 MG PO CAPS
100.0000 mg | ORAL_CAPSULE | Freq: Two times a day (BID) | ORAL | Status: DC
  Administered 2021-04-27 – 2021-04-28 (×2): 100 mg via ORAL
  Filled 2021-04-27 (×2): qty 1

## 2021-04-27 MED ORDER — DIPHENHYDRAMINE HCL 12.5 MG/5ML PO ELIX: 12.5000 mg | ORAL_SOLUTION | ORAL | Status: DC | PRN

## 2021-04-27 MED ORDER — FERROUS SULFATE 325 (65 FE) MG PO TABS
325.0000 mg | ORAL_TABLET | Freq: Three times a day (TID) | ORAL | Status: DC
  Administered 2021-04-28 (×2): 325 mg via ORAL
  Filled 2021-04-27 (×3): qty 1

## 2021-04-27 MED ORDER — BISACODYL 10 MG RE SUPP: 10.0000 mg | Freq: Every day | RECTAL | Status: DC | PRN

## 2021-04-27 MED ORDER — CHLORHEXIDINE GLUCONATE 0.12 % MT SOLN: 15.0000 mL | Freq: Once | OROMUCOSAL | Status: AC

## 2021-04-27 MED ORDER — POVIDONE-IODINE 10 % EX SWAB
2.0000 "application " | Freq: Once | CUTANEOUS | Status: AC
  Administered 2021-04-27: 2 via TOPICAL

## 2021-04-27 MED ORDER — HYDROCODONE-ACETAMINOPHEN 7.5-325 MG PO TABS
1.0000 | ORAL_TABLET | ORAL | Status: DC | PRN
  Administered 2021-04-27 (×2): 2 via ORAL
  Filled 2021-04-27 (×3): qty 2

## 2021-04-27 MED ORDER — MIDAZOLAM HCL 5 MG/5ML IJ SOLN
INTRAMUSCULAR | Status: DC | PRN
  Administered 2021-04-27: 2 mg via INTRAVENOUS

## 2021-04-27 MED ORDER — ACETAMINOPHEN 325 MG PO TABS: 325.0000 mg | ORAL_TABLET | Freq: Four times a day (QID) | ORAL | Status: DC | PRN

## 2021-04-27 MED ORDER — ONDANSETRON HCL 4 MG/2ML IJ SOLN
INTRAMUSCULAR | Status: AC
  Filled 2021-04-27: qty 6

## 2021-04-27 MED ORDER — METHOCARBAMOL 500 MG IVPB - SIMPLE MED
INTRAVENOUS | Status: AC
  Filled 2021-04-27: qty 50

## 2021-04-27 MED ORDER — TRANEXAMIC ACID-NACL 1000-0.7 MG/100ML-% IV SOLN
1000.0000 mg | Freq: Once | INTRAVENOUS | Status: AC
  Administered 2021-04-27: 1000 mg via INTRAVENOUS
  Filled 2021-04-27: qty 100

## 2021-04-27 MED ORDER — ONDANSETRON HCL 4 MG PO TABS: 4.0000 mg | ORAL_TABLET | Freq: Four times a day (QID) | ORAL | Status: DC | PRN

## 2021-04-27 MED ORDER — ONDANSETRON HCL 4 MG/2ML IJ SOLN: 4.0000 mg | Freq: Four times a day (QID) | INTRAMUSCULAR | Status: DC | PRN

## 2021-04-27 MED ORDER — LACTATED RINGERS IV SOLN: INTRAVENOUS | Status: DC

## 2021-04-27 MED ORDER — PROPOFOL 1000 MG/100ML IV EMUL
INTRAVENOUS | Status: AC
  Filled 2021-04-27: qty 100

## 2021-04-27 MED ORDER — PILOCARPINE HCL 1 % OP SOLN
1.0000 [drp] | Freq: Two times a day (BID) | OPHTHALMIC | Status: DC
  Administered 2021-04-28: 1 [drp] via OPHTHALMIC
  Filled 2021-04-27: qty 15

## 2021-04-27 MED ORDER — METHOCARBAMOL 500 MG IVPB - SIMPLE MED
500.0000 mg | Freq: Four times a day (QID) | INTRAVENOUS | Status: DC | PRN
  Administered 2021-04-27: 500 mg via INTRAVENOUS
  Filled 2021-04-27: qty 50

## 2021-04-27 MED ORDER — DEXMEDETOMIDINE (PRECEDEX) IN NS 20 MCG/5ML (4 MCG/ML) IV SYRINGE
PREFILLED_SYRINGE | INTRAVENOUS | Status: DC | PRN
  Administered 2021-04-27: 8 ug via INTRAVENOUS

## 2021-04-27 MED ORDER — PHENYLEPHRINE 40 MCG/ML (10ML) SYRINGE FOR IV PUSH (FOR BLOOD PRESSURE SUPPORT)
PREFILLED_SYRINGE | INTRAVENOUS | Status: AC
  Filled 2021-04-27: qty 10

## 2021-04-27 MED ORDER — HYDROCODONE-ACETAMINOPHEN 5-325 MG PO TABS
1.0000 | ORAL_TABLET | ORAL | Status: DC | PRN
  Administered 2021-04-27: 1 via ORAL
  Administered 2021-04-28: 2 via ORAL
  Filled 2021-04-27: qty 2

## 2021-04-27 MED ORDER — DEXAMETHASONE SODIUM PHOSPHATE 10 MG/ML IJ SOLN: 8.0000 mg | Freq: Once | INTRAMUSCULAR | Status: DC

## 2021-04-27 MED ORDER — PHENYLEPHRINE HCL-NACL 20-0.9 MG/250ML-% IV SOLN
INTRAVENOUS | Status: AC
  Filled 2021-04-27: qty 750

## 2021-04-27 MED ORDER — PROPOFOL 500 MG/50ML IV EMUL
INTRAVENOUS | Status: DC | PRN
  Administered 2021-04-27: 85 ug/kg/min via INTRAVENOUS

## 2021-04-27 MED ORDER — CELECOXIB 200 MG PO CAPS
200.0000 mg | ORAL_CAPSULE | Freq: Two times a day (BID) | ORAL | Status: DC
  Administered 2021-04-27 – 2021-04-28 (×3): 200 mg via ORAL
  Filled 2021-04-27 (×3): qty 1

## 2021-04-27 MED ORDER — PROPOFOL 500 MG/50ML IV EMUL
INTRAVENOUS | Status: AC
  Filled 2021-04-27: qty 50

## 2021-04-27 MED ORDER — MIDAZOLAM HCL 2 MG/2ML IJ SOLN
INTRAMUSCULAR | Status: AC
  Filled 2021-04-27: qty 2

## 2021-04-27 MED ORDER — POLYETHYLENE GLYCOL 3350 17 G PO PACK: 17.0000 g | PACK | Freq: Every day | ORAL | Status: DC | PRN

## 2021-04-27 MED ORDER — HYDROCODONE-ACETAMINOPHEN 5-325 MG PO TABS
ORAL_TABLET | ORAL | Status: AC
  Filled 2021-04-27: qty 1

## 2021-04-27 MED ORDER — TRANEXAMIC ACID-NACL 1000-0.7 MG/100ML-% IV SOLN
1000.0000 mg | INTRAVENOUS | Status: AC
  Administered 2021-04-27: 1000 mg via INTRAVENOUS
  Filled 2021-04-27: qty 100

## 2021-04-27 MED ORDER — CEFAZOLIN SODIUM-DEXTROSE 2-4 GM/100ML-% IV SOLN
2.0000 g | Freq: Four times a day (QID) | INTRAVENOUS | Status: AC
  Administered 2021-04-27 (×2): 2 g via INTRAVENOUS
  Filled 2021-04-27 (×2): qty 100

## 2021-04-27 MED ORDER — METHOCARBAMOL 500 MG PO TABS
500.0000 mg | ORAL_TABLET | Freq: Four times a day (QID) | ORAL | Status: DC | PRN
  Administered 2021-04-27 – 2021-04-28 (×2): 500 mg via ORAL
  Filled 2021-04-27 (×2): qty 1

## 2021-04-27 MED ORDER — MORPHINE SULFATE (PF) 4 MG/ML IV SOLN
INTRAVENOUS | Status: AC
  Filled 2021-04-27: qty 1

## 2021-04-27 MED ORDER — DEXAMETHASONE SODIUM PHOSPHATE 10 MG/ML IJ SOLN
INTRAMUSCULAR | Status: AC
  Filled 2021-04-27: qty 3

## 2021-04-27 MED ORDER — PHENYLEPHRINE 40 MCG/ML (10ML) SYRINGE FOR IV PUSH (FOR BLOOD PRESSURE SUPPORT)
PREFILLED_SYRINGE | INTRAVENOUS | Status: DC | PRN
  Administered 2021-04-27 (×4): 40 ug via INTRAVENOUS

## 2021-04-27 MED ORDER — DEXMEDETOMIDINE (PRECEDEX) IN NS 20 MCG/5ML (4 MCG/ML) IV SYRINGE
PREFILLED_SYRINGE | INTRAVENOUS | Status: AC
  Filled 2021-04-27: qty 5

## 2021-04-27 MED ORDER — ONDANSETRON HCL 4 MG/2ML IJ SOLN
INTRAMUSCULAR | Status: DC | PRN
  Administered 2021-04-27: 4 mg via INTRAVENOUS

## 2021-04-27 MED ORDER — STERILE WATER FOR IRRIGATION IR SOLN
Status: DC | PRN
  Administered 2021-04-27: 2000 mL

## 2021-04-27 MED ORDER — CEFAZOLIN SODIUM-DEXTROSE 2-4 GM/100ML-% IV SOLN
2.0000 g | INTRAVENOUS | Status: AC
  Administered 2021-04-27: 2 g via INTRAVENOUS
  Filled 2021-04-27: qty 100

## 2021-04-27 MED ORDER — PHENOL 1.4 % MT LIQD: 1.0000 | OROMUCOSAL | Status: DC | PRN

## 2021-04-27 MED ORDER — DEXAMETHASONE SODIUM PHOSPHATE 10 MG/ML IJ SOLN
INTRAMUSCULAR | Status: DC | PRN
  Administered 2021-04-27: 8 mg via INTRAVENOUS

## 2021-04-27 MED ORDER — PROPOFOL 10 MG/ML IV BOLUS
INTRAVENOUS | Status: AC
  Filled 2021-04-27: qty 60

## 2021-04-27 MED ORDER — FENTANYL CITRATE (PF) 100 MCG/2ML IJ SOLN
INTRAMUSCULAR | Status: AC
  Filled 2021-04-27: qty 2

## 2021-04-27 MED ORDER — 0.9 % SODIUM CHLORIDE (POUR BTL) OPTIME
TOPICAL | Status: DC | PRN
  Administered 2021-04-27: 1000 mL

## 2021-04-27 MED ORDER — FENTANYL CITRATE (PF) 100 MCG/2ML IJ SOLN
INTRAMUSCULAR | Status: DC | PRN
  Administered 2021-04-27: 50 ug via INTRAVENOUS

## 2021-04-27 MED ORDER — ASPIRIN 81 MG PO CHEW
81.0000 mg | CHEWABLE_TABLET | Freq: Two times a day (BID) | ORAL | Status: DC
  Administered 2021-04-27 – 2021-04-28 (×2): 81 mg via ORAL
  Filled 2021-04-27 (×2): qty 1

## 2021-04-27 MED ORDER — SODIUM CHLORIDE 0.9 % IV SOLN: INTRAVENOUS | Status: DC

## 2021-04-27 MED ORDER — PROMETHAZINE HCL 25 MG/ML IJ SOLN: 6.2500 mg | INTRAMUSCULAR | Status: DC | PRN

## 2021-04-27 MED ORDER — LIDOCAINE HCL (PF) 2 % IJ SOLN
INTRAMUSCULAR | Status: AC
  Filled 2021-04-27: qty 5

## 2021-04-27 SURGICAL SUPPLY — 43 items
BAG COUNTER SPONGE SURGICOUNT (BAG) IMPLANT
BAG DECANTER FOR FLEXI CONT (MISCELLANEOUS) IMPLANT
BAG SPEC THK2 15X12 ZIP CLS (MISCELLANEOUS)
BAG SPNG CNTER NS LX DISP (BAG)
BAG SURGICOUNT SPONGE COUNTING (BAG)
BAG ZIPLOCK 12X15 (MISCELLANEOUS) IMPLANT
BLADE SAG 18X100X1.27 (BLADE) ×3 IMPLANT
COVER PERINEAL POST (MISCELLANEOUS) ×3 IMPLANT
COVER SURGICAL LIGHT HANDLE (MISCELLANEOUS) ×3 IMPLANT
CUP ACET PINNACLE SECTR 56MM (Hips) ×1 IMPLANT
DERMABOND ADVANCED (GAUZE/BANDAGES/DRESSINGS) ×2
DERMABOND ADVANCED .7 DNX12 (GAUZE/BANDAGES/DRESSINGS) ×1 IMPLANT
DRAPE FOOT SWITCH (DRAPES) ×3 IMPLANT
DRAPE STERI IOBAN 125X83 (DRAPES) ×3 IMPLANT
DRAPE U-SHAPE 47X51 STRL (DRAPES) ×6 IMPLANT
DRESSING AQUACEL AG SP 3.5X10 (GAUZE/BANDAGES/DRESSINGS) ×1 IMPLANT
DRSG AQUACEL AG ADV 3.5X10 (GAUZE/BANDAGES/DRESSINGS) ×3 IMPLANT
DRSG AQUACEL AG SP 3.5X10 (GAUZE/BANDAGES/DRESSINGS) ×3
DURAPREP 26ML APPLICATOR (WOUND CARE) ×3 IMPLANT
ELECT REM PT RETURN 15FT ADLT (MISCELLANEOUS) ×3 IMPLANT
ELIMINATOR HOLE APEX DEPUY (Hips) ×3 IMPLANT
GLOVE SURG ENC MOIS LTX SZ6 (GLOVE) ×6 IMPLANT
GLOVE SURG ENC MOIS LTX SZ7 (GLOVE) ×3 IMPLANT
GLOVE SURG UNDER POLY LF SZ7.5 (GLOVE) ×3 IMPLANT
GOWN STRL REUS W/TWL LRG LVL3 (GOWN DISPOSABLE) ×6 IMPLANT
HEAD CERAMIC 36 PLUS5 (Hips) ×3 IMPLANT
HOLDER FOLEY CATH W/STRAP (MISCELLANEOUS) ×3 IMPLANT
KIT TURNOVER KIT A (KITS) IMPLANT
PACK ANTERIOR HIP CUSTOM (KITS) ×3 IMPLANT
PENCIL SMOKE EVACUATOR (MISCELLANEOUS) IMPLANT
PINNACLE ALTRX PLUS 4 N 36X56 (Hips) ×3 IMPLANT
PINNACLE SECTOR CUP 56MM (Hips) ×3 IMPLANT
SCREW 6.5MMX30MM (Screw) ×3 IMPLANT
STEM FEMORAL SZ5 HIGH ACTIS (Stem) ×3 IMPLANT
SUT MNCRL AB 4-0 PS2 18 (SUTURE) ×3 IMPLANT
SUT STRATAFIX 0 PDS 27 VIOLET (SUTURE) ×3
SUT VIC AB 1 CT1 36 (SUTURE) ×9 IMPLANT
SUT VIC AB 2-0 CT1 27 (SUTURE) ×6
SUT VIC AB 2-0 CT1 TAPERPNT 27 (SUTURE) ×2 IMPLANT
SUTURE STRATFX 0 PDS 27 VIOLET (SUTURE) ×1 IMPLANT
TRAY FOLEY MTR SLVR 16FR STAT (SET/KITS/TRAYS/PACK) IMPLANT
TUBE SUCTION HIGH CAP CLEAR NV (SUCTIONS) ×3 IMPLANT
WATER STERILE IRR 1000ML POUR (IV SOLUTION) ×3 IMPLANT

## 2021-04-27 NOTE — Care Plan (Signed)
Ortho Bundle Case Management Note  Patient Details  Name: Michael Vaughan MRN: [id number] Date of Birth: 1965-06-22  R THA on 04-27-21 DCP:  Home with son.  2 story home with 4 ste.   DME:  RW and 3-in-1 ordered through [employer] PT:  HEP                   DME Arranged:  Walker rolling, 3-N-1 DME Agency:  [employer]  HH Arranged:  NA HH Agency:  NA  Additional Comments: Please contact me with any questions of if this plan should need to change.  Ennis Forts, RN,CCM [facility]  [phone] 04/27/2021, 2:58 PM

## 2021-04-27 NOTE — Evaluation (Signed)
Physical Therapy Evaluation Patient Details Name: Michael Vaughan MRN: [id number] DOB: 1965-09-04 Today's Date: 04/27/2021  History of Present Illness  Patient is 55 y.o. male s/p Rt THA anterior approach on 04/27/21 with PMH significant for OA.  Clinical Impression  Michael Vaughan is a 55 y.o. male POD 0 s/p Rt THA. Patient reports independence with SPC mobility at baseline. Patient is now limited by functional impairments (see PT problem list below) and requires min assist for transfers and gait with RW. Patient was able to ambulate ~20 feet with RW and min assist, distance limited by pain. Patient instructed in exercise to facilitate ROM and circulation to manage edema. Patient will benefit from continued skilled PT interventions to address impairments and progress towards PLOF. Acute PT will follow to progress mobility and stair training in preparation for safe discharge home.        Recommendations for follow up therapy are one component of a multi-disciplinary discharge planning process, led by the attending physician.  Recommendations may be updated based on patient status, additional functional criteria and insurance authorization.  Follow Up Recommendations Follow physician's recommendations for discharge plan and follow up therapies    Assistance Recommended at Discharge Intermittent Supervision/Assistance  Functional Status Assessment Patient has had a recent decline in their functional status and demonstrates the ability to make significant improvements in function in a reasonable and predictable amount of time.  Equipment Recommendations  Rolling walker (2 wheels)    Recommendations for Other Services       Precautions / Restrictions Precautions Precautions: Fall Restrictions Weight Bearing Restrictions: No Other Position/Activity Restrictions: WBAT      Mobility  Bed Mobility Overal bed mobility: Needs Assistance Bed Mobility: Supine to Sit     Supine to sit: Min  assist;HOB elevated     General bed mobility comments: cues to use bed rail to pivot trunk and assist to bring Rt LE off EOB.    Transfers Overall transfer level: Needs assistance Equipment used: Rolling walker (2 wheels) Transfers: Sit to/from Stand Sit to Stand: Min assist;From elevated surface           General transfer comment: cues for hand placement on RW for power up and assist to rise and steady initially secondary to pain.    Ambulation/Gait Ambulation/Gait assistance: Min assist Gait Distance (Feet): 20 Feet Assistive device: Rolling walker (2 wheels) Gait Pattern/deviations: Step-to pattern;Decreased weight shift to right;Decreased step length - right;Antalgic Gait velocity: decr     General Gait Details: pt with heavy reliance on RW and advancing walker too far forward and taking long steps with Lt LE to catch up. repeated cues needed to maintain safe proximity to RW and assist to steady. distance limited due to pain.  Stairs            Wheelchair Mobility    Modified Rankin (Stroke Patients Only)       Balance Overall balance assessment: Needs assistance Sitting-balance support: Feet supported Sitting balance-Leahy Scale: Fair     Standing balance support: During functional activity;Reliant on assistive device for balance;Bilateral upper extremity supported Standing balance-Leahy Scale: Poor                               Pertinent Vitals/Pain Pain Assessment: 0-10 Pain Score: 7  Pain Location: Rt hip Pain Descriptors / Indicators: Aching;Discomfort;Tightness Pain Intervention(s): Limited activity within patient's tolerance;Monitored during session;Repositioned    Home Living Family/patient expects to be discharged  to:: Private residence Living Arrangements: Children Available Help at Discharge: Family Type of Home: House Home Access: Stairs to enter Entrance Stairs-Rails: Right Entrance Stairs-Number of Steps: 4 Alternate  Level Stairs-Number of Steps: pt does not go upstairs Home Layout: Two level;Full bath on main level;Able to live on main level with bedroom/bathroom Home Equipment: Gilmer Mor - single point;Shower seat;BSC/3in1      Prior Function Prior Level of Function : Independent/Modified Independent             Mobility Comments: using SPC due to pain       Hand Dominance        Extremity/Trunk Assessment   Upper Extremity Assessment Upper Extremity Assessment: Overall WFL for tasks assessed    Lower Extremity Assessment Lower Extremity Assessment: Overall WFL for tasks assessed    Cervical / Trunk Assessment Cervical / Trunk Assessment: Normal  Communication   Communication: No difficulties  Cognition Arousal/Alertness: Awake/alert Behavior During Therapy: WFL for tasks assessed/performed Overall Cognitive Status: Within Functional Limits for tasks assessed                                          General Comments      Exercises Total Joint Exercises Ankle Circles/Pumps: AROM;Both;20 reps;Seated   Assessment/Plan    PT Assessment Patient needs continued PT services  PT Problem List Decreased strength;Decreased range of motion;Decreased activity tolerance;Decreased balance;Decreased mobility;Decreased knowledge of use of DME;Decreased knowledge of precautions;Pain       PT Treatment Interventions Gait training;DME instruction;Stair training;Functional mobility training;Therapeutic activities;Therapeutic exercise;Patient/family education    PT Goals (Current goals can be found in the Care Plan section)  Acute Rehab PT Goals Patient Stated Goal: regain independence PT Goal Formulation: With patient Time For Goal Achievement: 05/04/21 Potential to Achieve Goals: Good    Frequency 7X/week   Barriers to discharge        Co-evaluation               AM-PAC PT "6 Clicks" Mobility  Outcome Measure Help needed turning from your back to your side  while in a flat bed without using bedrails?: A Little Help needed moving from lying on your back to sitting on the side of a flat bed without using bedrails?: A Little Help needed moving to and from a bed to a chair (including a wheelchair)?: A Little Help needed standing up from a chair using your arms (e.g., wheelchair or bedside chair)?: A Little Help needed to walk in hospital room?: A Lot Help needed climbing 3-5 steps with a railing? : A Lot 6 Click Score: 16    End of Session Equipment Utilized During Treatment: Gait belt Activity Tolerance: Patient tolerated treatment well Patient left: in chair;with call bell/phone within reach;with chair alarm set;with family/visitor present Nurse Communication: Mobility status PT Visit Diagnosis: Muscle weakness (generalized) (M62.81);Difficulty in walking, not elsewhere classified (R26.2)    Time: 5997-7414 PT Time Calculation (min) (ACUTE ONLY): 19 min   Charges:   PT Evaluation $PT Eval Low Complexity: 1 Low          Wynn Maudlin, DPT Acute Rehabilitation Services Office [phone] Pager [phone]   Anitra Lauth 04/27/2021, 4:41 PM

## 2021-04-27 NOTE — Transfer of Care (Signed)
Immediate Anesthesia Transfer of Care Note  Patient: Michael Vaughan  Procedure(s) Performed: Procedure(s): TOTAL HIP ARTHROPLASTY ANTERIOR APPROACH (Right)  Patient Location: PACU  Anesthesia Type:Spinal  Level of Consciousness:  sedated, patient cooperative and responds to stimulation  Airway & Oxygen Therapy:Patient Spontanous Breathing and Patient connected to face mask oxgen  Post-op Assessment:  Report given to PACU RN and Post -op Vital signs reviewed and stable  Post vital signs:  Reviewed and stable  Last Vitals:  Vitals:   04/27/21 1016 04/27/21 1018  BP: (!) 82/58 107/65  Pulse: 76 66  Resp:  13  Temp:    SpO2: 99% 100%    Complications: No apparent anesthesia complications

## 2021-04-27 NOTE — Anesthesia Postprocedure Evaluation (Signed)
Anesthesia Post Note  Patient: Michael Vaughan  Procedure(s) Performed: TOTAL HIP ARTHROPLASTY ANTERIOR APPROACH (Right: Hip)     Patient location during evaluation: PACU Anesthesia Type: Spinal Level of consciousness: awake and alert Pain management: pain level controlled Vital Signs Assessment: post-procedure vital signs reviewed and stable Respiratory status: spontaneous breathing Cardiovascular status: stable Anesthetic complications: no   No notable events documented.  Last Vitals:  Vitals:   04/27/21 1422 04/27/21 1713  BP: (!) 175/99 (!) 167/89  Pulse: 67 86  Resp: 15 18  Temp: 36.9 C   SpO2: 98% 97%    Last Pain:  Vitals:   04/27/21 1427  TempSrc:   PainSc: 6                  Lewie Loron

## 2021-04-27 NOTE — Interval H&P Note (Signed)
History and Physical Interval Note:  04/27/2021 7:19 AM  Michael Vaughan  has presented today for surgery, with the diagnosis of Right hip osteoarthritis.  The various methods of treatment have been discussed with the patient and family. After consideration of risks, benefits and other options for treatment, the patient has consented to  Procedure(s): TOTAL HIP ARTHROPLASTY ANTERIOR APPROACH (Right) as a surgical intervention.  The patient's history has been reviewed, patient examined, no change in status, stable for surgery.  I have reviewed the patient's chart and labs.  Questions were answered to the patient's satisfaction.     [name]

## 2021-04-27 NOTE — Anesthesia Preprocedure Evaluation (Addendum)
Anesthesia Evaluation  Patient identified by MRN, date of birth, ID band Patient awake    Reviewed: Allergy & Precautions, H&P , NPO status , Patient's Chart, lab work & pertinent test results  Airway Mallampati: II  TM Distance: >3 FB Neck ROM: Full    Dental  (+) Dental Advisory Given, Chipped, Missing,    Pulmonary former smoker,    Pulmonary exam normal breath sounds clear to auscultation       Cardiovascular Normal cardiovascular exam Rhythm:Regular Rate:Normal     Neuro/Psych    GI/Hepatic   Endo/Other  Hypothyroidism   Renal/GU      Musculoskeletal  (+) Arthritis ,   Abdominal   Peds  Hematology  (+) Blood dyscrasia, ,   Anesthesia Other Findings   Reproductive/Obstetrics                            Anesthesia Physical  Anesthesia Plan  ASA: 2  Anesthesia Plan: Spinal   Post-op Pain Management:    Induction: Intravenous  PONV Risk Score and Plan: 2 and Ondansetron, Treatment may vary due to age or medical condition, Midazolam and Propofol infusion  Airway Management Planned: Natural Airway  Additional Equipment:   Intra-op Plan:   Post-operative Plan:   Informed Consent: I have reviewed the patients History and Physical, chart, labs and discussed the procedure including the risks, benefits and alternatives for the proposed anesthesia with the patient or authorized representative who has indicated his/her understanding and acceptance.     Dental advisory given  Plan Discussed with: CRNA  Anesthesia Plan Comments:        Anesthesia Quick Evaluation

## 2021-04-27 NOTE — Discharge Instructions (Signed)

## 2021-04-27 NOTE — Op Note (Signed)
NAME:  Michael Vaughan                ACCOUNT NO.: [id number]      MEDICAL RECORD NO.: [id number]      FACILITY:  [facility]      PHYSICIAN:  [name]  DATE OF BIRTH:  06-Sep-1965     DATE OF PROCEDURE:  04/27/2021                                 OPERATIVE REPORT         PREOPERATIVE DIAGNOSIS: Right  hip osteoarthritis.      POSTOPERATIVE DIAGNOSIS:  Right hip osteoarthritis.      PROCEDURE:  Right total hip replacement through an anterior approach   utilizing DePuy THR system, component size 56 mm pinnacle cup, a size 36+4 neutral   Altrex liner, a size 5 Hi Actis stem with a 36+5 delta ceramic   ball.      SURGEON:  Madlyn Frankel. [name], [name]      ASSISTANT:  Rosalene Billings, PA-C     ANESTHESIA:  Spinal.      SPECIMENS:  None.      COMPLICATIONS:  None.      BLOOD LOSS:  800 cc     DRAINS:  None.      INDICATION OF THE PROCEDURE:  Michael Vaughan is a 55 y.o. male who had   presented to office for evaluation of right hip pain.  Radiographs revealed   progressive degenerative changes with bone-on-bone   articulation of the  hip joint, including subchondral cystic changes and osteophytes.  The patient had painful limited range of   motion significantly affecting their overall quality of life and function.  The patient was failing to    respond to conservative measures including medications and/or injections and activity modification and at this point was ready   to proceed with more definitive measures.  Consent was obtained for   benefit of pain relief.  Specific risks of infection, DVT, component   failure, dislocation, neurovascular injury, and need for revision surgery were reviewed in the office as well discussion of   the anterior versus posterior approach were reviewed.     PROCEDURE IN DETAIL:  The patient was brought to operative theater.   Once adequate anesthesia, preoperative antibiotics, 2 gm of Ancef, 1 gm of Tranexamic Acid, and 10 mg of  Decadron were administered, the patient was positioned supine on the [employer] table.  Once the patient was safely positioned with adequate padding of boney prominences we predraped out the hip, and used fluoroscopy to confirm orientation of the pelvis.      The right hip was then prepped and draped from proximal iliac crest to   mid thigh with a shower curtain technique.      Time-out was performed identifying the patient, planned procedure, and the appropriate extremity.     An incision was then made 2 cm lateral to the   anterior superior iliac spine extending over the orientation of the   tensor fascia lata muscle and sharp dissection was carried down to the   fascia of the muscle.      The fascia was then incised.  The muscle belly was identified and swept   laterally and retractor placed along the superior neck.  Following   cauterization of the circumflex vessels and removing some pericapsular  fat, a second cobra retractor was placed on the inferior neck.  A T-capsulotomy was made along the line of the   superior neck to the trochanteric fossa, then extended proximally and   distally.  Tag sutures were placed and the retractors were then placed   intracapsular.  We then identified the trochanteric fossa and   orientation of my neck cut and then made a neck osteotomy with the femur on traction.  The femoral   head was removed without difficulty or complication.  Traction was let   off and retractors were placed posterior and anterior around the   acetabulum.      The labrum and foveal tissue were debrided.  I began reaming with a 47 mm   reamer and reamed up to 55 mm reamer with good bony bed preparation and a 56 mm  cup was chosen.  The final 56 mm Pinnacle cup was then impacted under fluoroscopy to confirm the depth of penetration and orientation with respect to   Abduction and forward flexion.  A screw was placed into the ilium followed by the hole eliminator.  The final   36+4  neutral Altrex liner was impacted with good visualized rim fit.  The cup was positioned anatomically within the acetabular portion of the pelvis.      At this point, the femur was rolled to 100 degrees.  Further capsule was   released off the inferior aspect of the femoral neck.  I then   released the superior capsule proximally.  With the leg in a neutral position the hook was placed laterally   along the femur under the vastus lateralis origin and elevated manually and then held in position using the hook attachment on the bed.  The leg was then extended and adducted with the leg rolled to 100   degrees of external rotation.  Retractors were placed along the medial calcar and posteriorly over the greater trochanter.  Once the proximal femur was fully   exposed, I used a box osteotome to set orientation.  I then began   broaching with the starting chili pepper broach and passed this by hand and then broached up to 5.  With the 5 broach in place I chose a high offset neck and did several trial reductions.  The offset was appropriate, leg lengths   appeared to be equal best matched with the +5 head ball trial confirmed radiographically.   Given these findings, I went ahead and dislocated the hip, repositioned all   retractors and positioned the right hip in the extended and abducted position.  The final 5 Hi Actis stem was   chosen and it was impacted down to the level of neck cut.  Based on this   and the trial reductions, a final 36+5 delta ceramic ball was chosen and   impacted onto a clean and dry trunnion, and the hip was reduced.  The   hip had been irrigated throughout the case again at this point.  I did   reapproximate the superior capsular leaflet to the anterior leaflet   using #1 Vicryl.  The fascia of the   tensor fascia lata muscle was then reapproximated using #1 Vicryl and #0 Stratafix sutures.  The   remaining wound was closed with 2-0 Vicryl and running 4-0 Monocryl.   The hip  was cleaned, dried, and dressed sterilely using Dermabond and   Aquacel dressing.  The patient was then brought   to recovery room in  stable condition tolerating the procedure well.    Rosalene Billings, PA-C was present for the entirety of the case involved from   preoperative positioning, perioperative retractor management, general   facilitation of the case, as well as primary wound closure as assistant.            Madlyn Frankel [name], [name]        04/27/2021 9:59 AM

## 2021-04-28 ENCOUNTER — Encounter (HOSPITAL_COMMUNITY): Payer: Self-pay | Admitting: Orthopedic Surgery

## 2021-04-28 DIAGNOSIS — Z87891 Personal history of nicotine dependence: Secondary | ICD-10-CM | POA: Diagnosis not present

## 2021-04-28 DIAGNOSIS — M1611 Unilateral primary osteoarthritis, right hip: Secondary | ICD-10-CM | POA: Diagnosis not present

## 2021-04-28 DIAGNOSIS — E039 Hypothyroidism, unspecified: Secondary | ICD-10-CM | POA: Diagnosis not present

## 2021-04-28 LAB — BASIC METABOLIC PANEL
Anion gap: 5 (ref 5–15)
BUN: 14 mg/dL (ref 6–20)
CO2: 25 mmol/L (ref 22–32)
Calcium: 8.5 mg/dL — ABNORMAL LOW (ref 8.9–10.3)
Chloride: 104 mmol/L (ref 98–111)
Creatinine, Ser: 0.95 mg/dL (ref 0.61–1.24)
GFR, Estimated: >60 mL/min (ref >60)
Glucose, Bld: 154 mg/dL — ABNORMAL HIGH (ref 70–99)
Potassium: 4.5 mmol/L (ref 3.5–5.1)
Sodium: 134 mmol/L — ABNORMAL LOW (ref 135–145)

## 2021-04-28 LAB — CBC
HCT: 33.8 % — ABNORMAL LOW (ref 39.0–52.0)
Hemoglobin: 11.6 g/dL — ABNORMAL LOW (ref 13.0–17.0)
MCH: 31.1 pg (ref 26.0–34.0)
MCHC: 34.3 g/dL (ref 30.0–36.0)
MCV: 90.6 fL (ref 80.0–100.0)
Platelets: 244 10*3/uL (ref 150–400)
RBC: 3.73 MIL/uL — ABNORMAL LOW (ref 4.22–5.81)
RDW: 13 % (ref 11.5–15.5)
WBC: 15.2 10*3/uL — ABNORMAL HIGH (ref 4.0–10.5)
nRBC: 0 % (ref 0.0–0.2)

## 2021-04-28 MED ORDER — METHOCARBAMOL 500 MG PO TABS
500.0000 mg | ORAL_TABLET | Freq: Four times a day (QID) | ORAL | 0 refills | Status: DC | PRN
Start: 2021-04-28 — End: 2021-12-20

## 2021-04-28 MED ORDER — HYDROCODONE-ACETAMINOPHEN 7.5-325 MG PO TABS: 1.0000 | ORAL_TABLET | Freq: Four times a day (QID) | ORAL | 0 refills | Status: DC | PRN

## 2021-04-28 MED ORDER — ASPIRIN 81 MG PO CHEW: 81.0000 mg | CHEWABLE_TABLET | Freq: Two times a day (BID) | ORAL | 0 refills | Status: AC

## 2021-04-28 MED ORDER — DOCUSATE SODIUM 100 MG PO CAPS
100.0000 mg | ORAL_CAPSULE | Freq: Two times a day (BID) | ORAL | 0 refills | Status: DC
Start: 2021-04-28 — End: 2021-12-20

## 2021-04-28 MED ORDER — POLYETHYLENE GLYCOL 3350 17 G PO PACK: 17.0000 g | PACK | Freq: Every day | ORAL | 0 refills | Status: DC | PRN

## 2021-04-28 MED ORDER — CELECOXIB 200 MG PO CAPS: 200.0000 mg | ORAL_CAPSULE | Freq: Two times a day (BID) | ORAL | 0 refills | Status: DC

## 2021-04-28 NOTE — TOC Transition Note (Signed)
Transition of Care [facility]) - CM/SW Discharge Note  Patient Details  Name: Michael Vaughan MRN: [id number] Date of Birth: [birth year]  [facility] of [facility]) CM/SW Contact:  Sherie Don, LCSW Phone Number: 04/28/2021, 10:12 AM  Clinical Narrative: Patient is expected to discharge home after working with PT. CSW met with patient to confirm discharge plan. Patient will discharge home with a home exercise program (HEP). Patient will need a rolling walker, but declined the 3N1 as he will borrow one from his mother. [employer] to deliver walker to patient's room. TOC signing off.  Final next level of care: Home/Self Care Barriers to Discharge: No Barriers Identified  Patient Goals and CMS Choice Patient states their goals for this hospitalization and ongoing recovery are:: Discharge home with HEP CMS Medicare.gov Compare Post Acute Care list provided to:: Patient Choice offered to / list presented to : Patient  Discharge Plan and Services       DME Arranged: Walker rolling DME Agency: [employer] Representative spoke with at DME Agency: Prearranged in orthopedist's office HH Arranged: NA Divide Agency: NA  Readmission Risk Interventions No flowsheet data found.

## 2021-04-28 NOTE — Plan of Care (Signed)
  Problem: Clinical Measurements: Goal: Ability to maintain clinical measurements within normal limits will improve Outcome: Progressing   Problem: Activity: Goal: Risk for activity intolerance will decrease Outcome: Progressing   Problem: Pain Managment: Goal: General experience of comfort will improve Outcome: Progressing   

## 2021-04-28 NOTE — Plan of Care (Signed)
Discharge instructions given to the pt. 

## 2021-04-28 NOTE — Progress Notes (Signed)
   Subjective: 1 Day Post-Op Procedure(s) (LRB): TOTAL HIP ARTHROPLASTY ANTERIOR APPROACH (Right) Patient reports pain as mild.   Patient seen in rounds with Dr. [name]. Patient is resting in bed on exam this morning. He reports he did have significant pain last night, which has improved. Foley catheter removed, positive flatus. Ambulated 20 feet with PT today.  We will start therapy today.   Objective: Vital signs in last 24 hours: Temp:  [97.2 F (36.2 C)-98.5 F (36.9 C)] 98 F (36.7 C) (11/16 0549) Pulse Rate:  [59-94] 80 (11/16 0549) Resp:  [10-18] 17 (11/16 0549) BP: (82-175)/(57-99) 117/76 (11/16 0549) SpO2:  [97 %-100 %] 98 % (11/16 0549) Weight:  [88 kg] 88 kg (11/15 1422)  Intake/Output from previous day:  Intake/Output Summary (Last 24 hours) at 04/28/2021 0743 Last data filed at 04/28/2021 0600 Gross per 24 hour  Intake 3761.28 ml  Output 4225 ml  Net -463.72 ml     Intake/Output this shift: No intake/output data recorded.  Labs: Recent Labs    04/28/21 0321  HGB 11.6*   Recent Labs    04/28/21 0321  WBC 15.2*  RBC 3.73*  HCT 33.8*  PLT 244   Recent Labs    04/28/21 0321  NA 134*  K 4.5  CL 104  CO2 25  BUN 14  CREATININE 0.95  GLUCOSE 154*  CALCIUM 8.5*   No results for input(s): LABPT, INR in the last 72 hours.  Exam: General - Patient is Alert and Oriented Extremity - Neurologically intact Sensation intact distally Intact pulses distally Dorsiflexion/Plantar flexion intact Dressing - dressing C/D/I Motor Function - intact, moving foot and toes well on exam.   Past Medical History:  Diagnosis Date   Allergy    Arthralgia    takes aleve regularly   Arthritis    Cataract    Heartburn    Hereditary hemochromatosis (HCC) 05/2013   iron overload with mild HM, homozygous for C282Y, monthly blood draws   History of chicken pox    History of migraine 1990s   remote   Retinitis pigmentosa     Assessment/Plan: 1 Day Post-Op  Procedure(s) (LRB): TOTAL HIP ARTHROPLASTY ANTERIOR APPROACH (Right) Active Problems:   S/P right total hip arthroplasty   S/P total right hip arthroplasty  Estimated body mass index is 28.65 kg/m as calculated from the following:   Height as of this encounter: 5\' 9"  (1.753 m).   Weight as of this encounter: 88 kg. Advance diet Up with therapy D/C IV fluids  DVT Prophylaxis - Aspirin Weight bearing as tolerated.  Plan is to go Home after hospital stay. Plan for discharge home after 1-2 sessions of therapy as long as he is meeting his goals. Follow up in the office in 2 weeks.   , PA-C Orthopedic Surgery [phone] 04/28/2021, 7:43 AM

## 2021-04-28 NOTE — Progress Notes (Signed)
Physical Therapy Treatment Patient Details Name: Michael Vaughan MRN: [id number] DOB: [birth year] Today's Date: 04/28/2021   History of Present Illness Patient is 55 y.o. male s/p Rt THA anterior approach on 04/27/21 with PMH significant for OA.    PT Comments    Pt is POD # 1 and is progressing well.  Pain is much improved and pt is steady with mobility.  He ambulated 200' and performed 5 steps.  Pt demonstrates safe gait & transfers in order to return home from PT perspective once discharged by MD.  While in hospital, will continue to benefit from PT for skilled therapy to advance mobility and exercises.      Recommendations for follow up therapy are one component of a multi-disciplinary discharge planning process, led by the attending physician.  Recommendations may be updated based on patient status, additional functional criteria and insurance authorization.  Follow Up Recommendations  Follow physician's recommendations for discharge plan and follow up therapies     Assistance Recommended at Discharge Intermittent Supervision/Assistance  Equipment Recommendations  Rolling walker (2 wheels)    Recommendations for Other Services       Precautions / Restrictions Precautions Precautions: Fall Restrictions Weight Bearing Restrictions: No Other Position/Activity Restrictions: WBAT     Mobility  Bed Mobility               General bed mobility comments: in chair; reports sat up independently    Transfers Overall transfer level: Needs assistance Equipment used: Rolling walker (2 wheels) Transfers: Sit to/from Stand Sit to Stand: Supervision           General transfer comment: min cues for R LE management    Ambulation/Gait Ambulation/Gait assistance: Supervision Gait Distance (Feet): 200 Feet Assistive device: Rolling walker (2 wheels) Gait Pattern/deviations: Step-through pattern;Decreased stance time - right       General Gait Details: Started step to R  gait progressed to step through with cues; cues to push RW; tolerated well   Stairs Stairs: Yes Stairs assistance: Min guard Stair Management: One rail Right;Step to pattern;Forwards Number of Stairs: 5 General stair comments: educated on sequencing   Wheelchair Mobility    Modified Rankin (Stroke Patients Only)       Balance Overall balance assessment: Needs assistance Sitting-balance support: No upper extremity supported Sitting balance-Leahy Scale: Good     Standing balance support: Bilateral upper extremity supported;No upper extremity supported;Single extremity supported Standing balance-Leahy Scale: Fair Standing balance comment: RW to ambulate but could stand without support and steps wtih single UE                            Cognition Arousal/Alertness: Awake/alert Behavior During Therapy: WFL for tasks assessed/performed Overall Cognitive Status: Within Functional Limits for tasks assessed                                 General Comments: Very pleasant and motivated        Exercises Total Joint Exercises Ankle Circles/Pumps: AROM;Both;20 reps;Seated Quad Sets: AROM;Both;10 reps;Seated Heel Slides: AROM;Right;10 reps;Seated (educated on AAROM if needed) Hip ABduction/ADduction: AROM;Right;10 reps;Seated (educated on AAROM if needed) Knee Flexion: AROM;Right;10 reps;Seated    General Comments   Educated on safe ice use, no pivots, car transfers. Also, encouraged walking every 1-2 hours during day. Educated on HEP with focus on mobility the first weeks. Discussed doing exercises within pain control and  if pain increasing could decreased ROM, reps, and stop exercises as needed. Encouraged to perform quad sets and ankle pumps frequently for blood flow and to promote full knee extension.      Pertinent Vitals/Pain Pain Assessment: No/denies pain    Home Living                          Prior Function            PT  Goals (current goals can now be found in the care plan section) Progress towards PT goals: Progressing toward goals    Frequency    7X/week      PT Plan Current plan remains appropriate    Co-evaluation              AM-PAC PT "6 Clicks" Mobility   Outcome Measure  Help needed turning from your back to your side while in a flat bed without using bedrails?: None Help needed moving from lying on your back to sitting on the side of a flat bed without using bedrails?: None Help needed moving to and from a bed to a chair (including a wheelchair)?: A Little Help needed standing up from a chair using your arms (e.g., wheelchair or bedside chair)?: A Little Help needed to walk in hospital room?: A Little Help needed climbing 3-5 steps with a railing? : A Little 6 Click Score: 20    End of Session Equipment Utilized During Treatment: Gait belt Activity Tolerance: Patient tolerated treatment well Patient left: in chair;with call bell/phone within reach;with chair alarm set Nurse Communication: Mobility status PT Visit Diagnosis: Muscle weakness (generalized) (M62.81);Difficulty in walking, not elsewhere classified (R26.2)     Time: 7322-0254 PT Time Calculation (min) (ACUTE ONLY): 23 min  Charges:  $Gait Training: 8-22 mins $Therapeutic Exercise: 8-22 mins                     Anise Salvo, PT Acute Rehab Services Pager [phone] [facility] [phone]    Rayetta Humphrey 04/28/2021, 11:25 AM

## 2021-04-30 NOTE — Progress Notes (Signed)
CBC is a medically necessary test prior to undergoing joint replacement surgery. It is pertinent to know pre-operative hemoglobin for surgical risk assessment and planning. ? ?

## 2021-05-10 NOTE — Discharge Summary (Signed)
Physician Discharge Summary   Patient ID: Michael Vaughan MRN: [id number] DOB/AGE: December 04, 1965 55 y.o.  Admit date: 04/27/2021 Discharge date: 04/28/2021  Primary Diagnosis: Right  hip osteoarthritis.   Admission Diagnoses:  Past Medical History:  Diagnosis Date   Allergy    Arthralgia    takes aleve regularly   Arthritis    Cataract    Heartburn    Hereditary hemochromatosis (HCC) 05/2013   iron overload with mild HM, homozygous for C282Y, monthly blood draws   History of chicken pox    History of migraine 1990s   remote   Retinitis pigmentosa    Discharge Diagnoses:   Active Problems:   S/P right total hip arthroplasty   S/P total right hip arthroplasty  Estimated body mass index is 28.65 kg/m as calculated from the following:   Height as of this encounter: 5\' 9"  (1.753 m).   Weight as of this encounter: 88 kg.  Procedure:  Procedure(s) (LRB): TOTAL HIP ARTHROPLASTY ANTERIOR APPROACH (Right)   Consults: None  HPI: Kevin Space is a 55 y.o. male who had   presented to office for evaluation of right hip pain.  Radiographs revealed   progressive degenerative changes with bone-on-bone   articulation of the  hip joint, including subchondral cystic changes and osteophytes.  The patient had painful limited range of   motion significantly affecting their overall quality of life and function.  The patient was failing to    respond to conservative measures including medications and/or injections and activity modification and at this point was ready   to proceed with more definitive measures.  Consent was obtained for   benefit of pain relief.  Specific risks of infection, DVT, component   failure, dislocation, neurovascular injury, and need for revision surgery were reviewed in the office as well discussion of   the anterior versus posterior approach were reviewed.     Laboratory Data: Admission on 04/27/2021, Discharged on 04/28/2021  Component Date Value Ref Range  Status   ABO/RH(D) 04/27/2021    Final                   Value:O POS Performed at [facility], [address], [place], [place]    WBC 04/28/2021 15.2 (H)  4.0 - 10.5 K/uL Final   RBC 04/28/2021 3.73 (L)  4.22 - 5.81 MIL/uL Final   Hemoglobin 04/28/2021 11.6 (L)  13.0 - 17.0 g/dL Final   HCT 04/30/2021 33.8 (L)  39.0 - 52.0 % Final   MCV 04/28/2021 90.6  80.0 - 100.0 fL Final   MCH 04/28/2021 31.1  26.0 - 34.0 pg Final   MCHC 04/28/2021 34.3  30.0 - 36.0 g/dL Final   RDW 04/30/2021 13.0  11.5 - 15.5 % Final   Platelets 04/28/2021 244  150 - 400 K/uL Final   nRBC 04/28/2021 0.0  0.0 - 0.2 % Final   Performed at [facility], [address] [place], [place]   Sodium 04/28/2021 134 (L)  135 - 145 mmol/L Final   Potassium 04/28/2021 4.5  3.5 - 5.1 mmol/L Final   Chloride 04/28/2021 104  98 - 111 mmol/L Final   CO2 04/28/2021 25  22 - 32 mmol/L Final   Glucose, Bld 04/28/2021 154 (H)  70 - 99 mg/dL Final   Glucose reference range applies only to samples taken after fasting for at least 8 hours.   BUN 04/28/2021 14  6 - 20 mg/dL Final  Creatinine, Ser 04/28/2021 0.95  0.61 - 1.24 mg/dL Final   Calcium 16/03/9603 8.5 (L)  8.9 - 10.3 mg/dL Final   GFR, Estimated 04/28/2021 >60  >60 mL/min Final   Comment: (NOTE) Calculated using the CKD-EPI Creatinine Equation (2021)    Anion gap 04/28/2021 5  5 - 15 Final   Performed at [facility], [address] [place], [place] [address]  Orders Only on 04/23/2021  Component Date Value Ref Range Status   SARS Coronavirus 2 04/23/2021 RESULT: NEGATIVE   Final   Comment: RESULT: NEGATIVESARS-CoV-2 INTERPRETATION:A NEGATIVE  test result means that SARS-CoV-2 RNA was not present in the specimen above the limit of detection of this test. This does not preclude a possible SARS-CoV-2 infection and should not be used as the  sole basis for patient management decisions. Negative  results must be combined with clinical observations, patient history, and epidemiological information. Optimum specimen types and timing for peak viral levels during infections caused by SARS-CoV-2  have not been determined. Collection of multiple specimens or types of specimens may be necessary to detect virus. Improper specimen collection and handling, sequence variability under primers/probes, or organism present below the limit of detection may  lead to false negative results. Positive and negative predictive values of testing are highly dependent on prevalence. False negative test results are more likely when prevalence of disease is high.The expected result is NEGATIVE.Fact S                          heet for  Healthcare Providers: [employer] Sheet for Patients: https://poole-freeman.org/ Reference Range - Negative   Hospital Outpatient Visit on 04/16/2021  Component Date Value Ref Range Status   MRSA, PCR 04/16/2021 NEGATIVE  NEGATIVE Final   Staphylococcus aureus 04/16/2021 NEGATIVE  NEGATIVE Final   Comment: (NOTE) The Xpert SA Assay (FDA approved for NASAL specimens in patients 66 years of age and older), is one component of a comprehensive surveillance program. It is not intended to diagnose infection nor to guide or monitor treatment. Performed at [facility], [address], [place], [place] [address]    WBC 04/16/2021 7.1  4.0 - 10.5 K/uL Final   RBC 04/16/2021 4.71  4.22 - 5.81 MIL/uL Final   Hemoglobin 04/16/2021 14.9  13.0 - 17.0 g/dL Final   HCT 78/29/5621 42.8  39.0 - 52.0 % Final   MCV 04/16/2021 90.9  80.0 - 100.0 fL Final   MCH 04/16/2021 31.6  26.0 - 34.0 pg Final   MCHC 04/16/2021 34.8  30.0 - 36.0 g/dL Final   RDW 30/86/5784 13.2  11.5 - 15.5 % Final   Platelets 04/16/2021 264  150 - 400 K/uL Final   nRBC 04/16/2021 0.0  0.0 - 0.2 % Final   Performed at [facility], [address] [place], [place] [address]   Sodium 04/16/2021 138  135 - 145 mmol/L Final   Potassium 04/16/2021 4.2  3.5 - 5.1 mmol/L Final   Chloride 04/16/2021 105  98 - 111 mmol/L Final   CO2 04/16/2021 26  22 - 32 mmol/L Final   Glucose, Bld 04/16/2021 99  70 - 99 mg/dL Final   Glucose reference range applies only to samples taken after fasting for at least 8 hours.   BUN 04/16/2021 11  6 - 20 mg/dL Final   Creatinine, Ser 04/16/2021 0.79  0.61 - 1.24 mg/dL Final   Calcium 52/84/1324 8.9  8.9 -  10.3 mg/dL Final   Total Protein 56/43/3295 6.9  6.5 - 8.1 g/dL Final   Albumin 18/84/1660 4.2  3.5 - 5.0 g/dL Final   AST 63/06/6008 28  15 - 41 U/L Final   ALT 04/16/2021 26  0 - 44 U/L Final   Alkaline Phosphatase 04/16/2021 71  38 - 126 U/L Final   Total Bilirubin 04/16/2021 0.9  0.3 - 1.2 mg/dL Final   GFR, Estimated 04/16/2021 >60  >60 mL/min Final   Comment: (NOTE) Calculated using the CKD-EPI Creatinine Equation (2021)    Anion gap 04/16/2021 7  5 - 15 Final   Performed at [facility], [address], [place], [place] [address]   ABO/RH(D) 04/16/2021 O POS   Final   Antibody Screen 04/16/2021 NEG   Final   Sample Expiration 04/16/2021 04/30/2021,2359   Final   Extend sample reason 04/16/2021    Final                   Value:NO TRANSFUSIONS OR PREGNANCY IN THE PAST 3 MONTHS Performed at [facility], [address] [place], [place] [address]   Appointment on 03/12/2021  Component Date Value Ref Range Status   Iron 03/12/2021 44  42 - 165 ug/dL Final   Transferrin 02/54/2706 194.0 (L)  212.0 - 360.0 mg/dL Final   Saturation Ratios 03/12/2021 16.2 (L)  20.0 - 50.0 % Final   Ferritin 03/12/2021 17.0 (L)  22.0 - 322.0 ng/mL Final   TIBC 03/12/2021 271.6  250.0 - 450.0 mcg/dL Final   WBC 23/76/2831 5.8  4.0 - 10.5 K/uL Final   RBC 03/12/2021 4.60  4.22 - 5.81 Mil/uL Final   Platelets 03/12/2021 214.0  150.0 - 400.0 K/uL Final   Hemoglobin  03/12/2021 14.5  13.0 - 17.0 g/dL Final   HCT 51/76/1607 42.5  39.0 - 52.0 % Final   MCV 03/12/2021 92.4  78.0 - 100.0 fl Final   MCHC 03/12/2021 34.2  30.0 - 36.0 g/dL Final   RDW 37/03/6268 15.1  11.5 - 15.5 % Final  Appointment on 03/12/2021  Component Date Value Ref Range Status   INR 03/12/2021 1.2 (H)  0.8 - 1.0 ratio Final   Prothrombin Time 03/12/2021 12.6  9.6 - 13.1 sec Final   Sodium 03/12/2021 141  135 - 145 mEq/L Final   Potassium 03/12/2021 3.5  3.5 - 5.1 mEq/L Final   Chloride 03/12/2021 105  96 - 112 mEq/L Final   CO2 03/12/2021 28  19 - 32 mEq/L Final   Glucose, Bld 03/12/2021 103 (H)  70 - 99 mg/dL Final   BUN 48/54/6270 13  6 - 23 mg/dL Final   Creatinine, Ser 03/12/2021 0.90  0.40 - 1.50 mg/dL Final   Total Bilirubin 03/12/2021 1.1  0.2 - 1.2 mg/dL Final   Alkaline Phosphatase 03/12/2021 61  39 - 117 U/L Final   AST 03/12/2021 24  0 - 37 U/L Final   ALT 03/12/2021 28  0 - 53 U/L Final   Total Protein 03/12/2021 6.3  6.0 - 8.3 g/dL Final   Albumin 35/00/9381 3.9  3.5 - 5.2 g/dL Final   GFR 82/99/3716 95.89  >60.00 mL/min Final   Calculated using the CKD-EPI Creatinine Equation (2021)   Calcium 03/12/2021 8.9  8.4 - 10.5 mg/dL Final     X-Rays:DG Pelvis Portable  Result Date: 04/27/2021 CLINICAL DATA:  Right hip replacement. EXAM: PORTABLE PELVIS 1-2 VIEWS COMPARISON:  Right hip x-rays from same day.  FINDINGS: The right hip demonstrates a total arthroplasty without evidence of hardware failure or complication. Small linear lucency through the inferior right acetabulum. No dislocation. The alignment is anatomic. Post-surgical changes and subcutaneous emphysema noted in the surrounding soft tissues. Mild left hip osteoarthritis. IMPRESSION: 1. Right total hip arthroplasty. Small linear lucency through the inferior right acetabulum, concerning for nondisplaced fracture. Electronically Signed   By: Obie Dredge [name]   On: 04/27/2021 10:47   DG C-Arm 1-60 Min-No  Report  Result Date: 04/27/2021 Fluoroscopy was utilized by the requesting physician.  No radiographic interpretation.   DG HIP OPERATIVE UNILAT W OR W/O PELVIS RIGHT  Result Date: 04/27/2021 CLINICAL DATA:  Right hip replacement EXAM: OPERATIVE right HIP (WITH PELVIS IF PERFORMED) 4 VIEWS TECHNIQUE: Fluoroscopic spot image(s) were submitted for interpretation post-operatively. COMPARISON:  None. FINDINGS: Four C-arm fluoroscopic images were obtained intraoperatively and submitted for post operative interpretation. The initial images demonstrate moderate to severe degenerative changes about the right hip. Subsequent images demonstrate right hip arthroplasty hardware which is within expected limits, without evidence of hardware related complication. Mild degenerative changes about the left hip are also noted. Fluoro time 7 seconds. Please see the performing provider's procedural report for further detail. IMPRESSION: Status post right hip arthroplasty without evidence of complication. Electronically Signed   By: Lesia Hausen [name]   On: 04/27/2021 10:08    EKG: Orders placed or performed in visit on 03/03/21   EKG 12-Lead     Hospital Course: Arby Dahir is a 55 y.o. who was admitted to [facility]. They were brought to the operating room on 04/27/2021 and underwent Procedure(s): TOTAL HIP ARTHROPLASTY ANTERIOR APPROACH.  Patient tolerated the procedure well and was later transferred to the recovery room and then to the orthopaedic floor for postoperative care. They were given PO and IV analgesics for pain control following their surgery. They were given 24 hours of postoperative antibiotics of  Anti-infectives (From admission, onward)    Start     Dose/Rate Route Frequency Ordered Stop   04/27/21 1500  ceFAZolin (ANCEF) IVPB 2g/100 mL premix        2 g 200 mL/hr over 30 Minutes Intravenous Every 6 hours 04/27/21 1419 04/27/21 2317   04/27/21 0630  ceFAZolin (ANCEF) IVPB 2g/100 mL  premix        2 g 200 mL/hr over 30 Minutes Intravenous On call to O.R. 04/27/21 1610 04/27/21 0844      and started on DVT prophylaxis in the form of Aspirin.   PT and OT were ordered for total joint protocol. Discharge planning consulted to help with postop disposition and equipment needs.  Patient had a good night on the evening of surgery. They started to get up OOB with therapy on POD #0. Pt was seen during rounds and was ready to go home pending progress with therapy.He worked with therapy on POD #1 and was meeting his goals. Pt was discharged to home later that day in stable condition.  Diet: Regular diet Activity: WBAT Follow-up: in 2 weeks Disposition: Home Discharged Condition: good   Discharge Instructions     Call MD / Call 911   Complete by: As directed    If you experience chest pain or shortness of breath, CALL 911 and be transported to the hospital emergency room.  If you develope a fever above 101 F, pus (white drainage) or increased drainage or redness at the wound, or calf pain, call your surgeon's office.   Change  dressing   Complete by: As directed    Maintain surgical dressing until follow up in the clinic. If the edges start to pull up, may reinforce with tape. If the dressing is no longer working, may remove and cover with gauze and tape, but must keep the area dry and clean.  Call with any questions or concerns.   Constipation Prevention   Complete by: As directed    Drink plenty of fluids.  Prune juice may be helpful.  You may use a stool softener, such as Colace (over the counter) 100 mg twice a day.  Use MiraLax (over the counter) for constipation as needed.   Diet - low sodium heart healthy   Complete by: As directed    Increase activity slowly as tolerated   Complete by: As directed    Weight bearing as tolerated with assist device (walker, cane, etc) as directed, use it as long as suggested by your surgeon or therapist, typically at least 4-6 weeks.    Post-operative opioid taper instructions:   Complete by: As directed    POST-OPERATIVE OPIOID TAPER INSTRUCTIONS: It is important to wean off of your opioid medication as soon as possible. If you do not need pain medication after your surgery it is ok to stop day one. Opioids include: Codeine, Hydrocodone(Norco, Vicodin), Oxycodone(Percocet, oxycontin) and hydromorphone amongst others.  Long term and even short term use of opiods can cause: Increased pain response Dependence Constipation Depression Respiratory depression And more.  Withdrawal symptoms can include Flu like symptoms Nausea, vomiting And more Techniques to manage these symptoms Hydrate well Eat regular healthy meals Stay active Use relaxation techniques(deep breathing, meditating, yoga) Do Not substitute Alcohol to help with tapering If you have been on opioids for less than two weeks and do not have pain than it is ok to stop all together.  Plan to wean off of opioids This plan should start within one week post op of your joint replacement. Maintain the same interval or time between taking each dose and first decrease the dose.  Cut the total daily intake of opioids by one tablet each day Next start to increase the time between doses. The last dose that should be eliminated is the evening dose.      TED hose   Complete by: As directed    Use stockings (TED hose) for 2 weeks on both leg(s).  You may remove them at night for sleeping.      Allergies as of 04/28/2021       Reactions   Augmentin [amoxicillin-pot Clavulanate] Shortness Of Breath, Swelling, Rash   No problems with amox        Medication List     STOP taking these medications    acetaminophen 500 MG tablet Commonly known as: TYLENOL   ibuprofen 200 MG tablet Commonly known as: ADVIL   naproxen sodium 220 MG tablet Commonly known as: ALEVE       TAKE these medications    aspirin 81 MG chewable tablet Chew 1 tablet (81 mg  total) by mouth 2 (two) times daily for 28 days.   celecoxib 200 MG capsule Commonly known as: CELEBREX Take 1 capsule (200 mg total) by mouth 2 (two) times daily.   docusate sodium 100 MG capsule Commonly known as: COLACE Take 1 capsule (100 mg total) by mouth 2 (two) times daily.   HYDROcodone-acetaminophen 7.5-325 MG tablet Commonly known as: NORCO Take 1-2 tablets by mouth every 6 (six) hours as needed for  severe pain (pain score 7-10).   methocarbamol 500 MG tablet Commonly known as: ROBAXIN Take 1 tablet (500 mg total) by mouth every 6 (six) hours as needed for muscle spasms.   pilocarpine 1 % ophthalmic solution Commonly known as: PILOCAR Place 1 drop into the left eye 2 (two) times daily.   polyethylene glycol 17 g packet Commonly known as: MIRALAX / GLYCOLAX Take 17 g by mouth daily as needed for mild constipation.               Discharge Care Instructions  (From admission, onward)           Start     Ordered   04/28/21 0000  Change dressing       Comments: Maintain surgical dressing until follow up in the clinic. If the edges start to pull up, may reinforce with tape. If the dressing is no longer working, may remove and cover with gauze and tape, but must keep the area dry and clean.  Call with any questions or concerns.   04/28/21 0747            Follow-up Information     Cassandria Anger, PA-C. Go on 05/12/2021.   Specialty: Orthopedic Surgery Why: You are scheduled for a follow up appointment on 05-12-21 at 9:30 am. Contact information: [address] [place] [address] [phone]                 Signed: Dennie Bible, PA-C Orthopedic Surgery 05/10/2021, 2:28 PM

## 2021-06-09 DIAGNOSIS — Z96641 Presence of right artificial hip joint: Secondary | ICD-10-CM | POA: Diagnosis not present

## 2021-06-09 DIAGNOSIS — Z471 Aftercare following joint replacement surgery: Secondary | ICD-10-CM | POA: Diagnosis not present

## 2021-06-09 DIAGNOSIS — Z4789 Encounter for other orthopedic aftercare: Secondary | ICD-10-CM | POA: Diagnosis not present

## 2021-06-20 NOTE — Progress Notes (Signed)
[place] Telephone:([phone]) [phone]   Fax:([phone]) [place] NOTE  Patient Care Team: Ria Bush, MD as PCP - General (Family Medicine) Hayden Pedro, MD as Consulting Physician (Ophthalmology) Ladene Artist, MD as Consulting Physician (Gastroenterology)  Hematological/Oncological History # Hereditary Hemochromatosis- Homozygous for C282Y  06/27/2013: HFE testing showed patient was homozygous for C282Y, ferritin >1500 06/19/13- 04/09/2018: underwent phlebotomies, Ferritin decreased to 41.7 on 05/09/2018 03/12/2021: Ferritin 17 06/21/2021: establish care with Dr Michael Vaughan   CHIEF COMPLAINTS/PURPOSE OF CONSULTATION:  "Hereditary Hemochromatosis "  HISTORY OF PRESENTING ILLNESS:  Michael Vaughan 56 y.o. male with medical history significant for diagnosed hereditary hemochromatosis who presents to establish care.   On review of the previous records Michael Vaughan was initially diagnosed with rotatory hemochromatosis on 06/27/2013.  He had HFE testing which showed he was homozygous for the 282Y mutation.  His ferritin was greater than 1500 at that time.  For several years he underwent phlebotomies decreasing his ferritin down to goal at 41.7 on 05/09/2018.  Most recently on 03/13/2019 the patient was found to have a ferritin of 17.  Due to changes in policy with [facility] gastroenterology service is no longer able to offer the patient phlebotomies for hereditary hemochromatosis.  As such the patient was referred to hematology for further evaluation and management.  On exam today Michael Vaughan reports he has been well in the interim since his last visit with gastroenterology.  He notes that he had his family tested for the C282Y mutation and found 2-second cousins who had this, but none of his children had the disease.  He is unsure if his father had the disease.  The patient reports he has made some dietary modifications and does not eat much red meat.  He reports he  never ate much in the way of vegetables.  He does have some "tiredness" but overall has been well.  He underwent hip surgery in November 2022 and notes that his hip pain has much improved since that time.  On further discussion the patient is that he was a smoker and quit in 2005.  He does still use dip tobacco.  He notes that he drinks alcohol most day and his drink of choice is rum.  He enjoys mixing this with sure wine.  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  His family history is remarkable for CLL in his father and prostate cancer in his brother.  He currently denies any fevers, chills, sweats, nausea, vomiting or diarrhea.  A full 10 point ROS is listed below.  MEDICAL HISTORY:  Past Medical History:  Diagnosis Date   Allergy    Arthralgia    takes aleve regularly   Arthritis    Cataract    Heartburn    Hereditary hemochromatosis ([place]) 05/2013   iron overload with mild HM, homozygous for C282Y, monthly blood draws   History of chicken pox    History of migraine 1990s   remote   Retinitis pigmentosa     SURGICAL HISTORY: Past Surgical History:  Procedure Laterality Date   CATARACT EXTRACTION BILATERAL W/ ANTERIOR VITRECTOMY Bilateral 2001   COLONOSCOPY  12/2016   W[facility])   [place]   at age 39   IRIDECTOMY Right 01/14/2014   Procedure: IRIDECTOMY;  Surgeon: Hayden Pedro, MD;  Location: [place];  Service: Ophthalmology;  Laterality: Right;   PARS PLANA VITRECTOMY  01/24/2012   Procedure: PARS PLANA  VITRECTOMY WITH 25G REMOVAL/SUTURE INTRAOCULAR LENS;  Surgeon: Hayden Pedro, MD;  Location: [place];  Service: Ophthalmology;  Laterality: Left;   PARS PLANA VITRECTOMY Right 01/14/2014   dislocated intraocular lens - PARS PLANA VITRECTOMY WITH 25G REMOVAL/SUTURE SECONDARY INTRAOCULAR LENS;  Hayden Pedro, MD   PHOTOCOAGULATION WITH LASER Right 01/14/2014   Procedure: PHOTOCOAGULATION WITH LASER;  Surgeon: Hayden Pedro, MD;   Location: [place];  Service: Ophthalmology;  Laterality: Right;   TOTAL HIP ARTHROPLASTY Right 04/27/2021   Procedure: TOTAL HIP ARTHROPLASTY ANTERIOR APPROACH;  Surgeon: Paralee Cancel, MD;  Location: [facility];  Service: Orthopedics;  Laterality: Right;    SOCIAL HISTORY: Social History   Socioeconomic History   Marital status: Single    Spouse name: Not on file   Number of children: 2   Years of education: Not on file   Highest education level: Not on file  Occupational History   Occupation: disabled  Tobacco Use   Smoking status: Former    Packs/day: 1.00    Types: Cigarettes    Quit date: 03/12/2004    Years since quitting: 17.2   Smokeless tobacco: Current    Types: Snuff    Last attempt to quit: 10/12/2015   Tobacco comments:    quit in 2005, occasional smokeless tobacco - now using the [employer] tobacco free snuff  Vaping Use   Vaping Use: Never used  Substance and Sexual Activity   Alcohol use: Yes    Alcohol/week: 0.0 standard drinks    Comment: rarely   Drug use: No   Sexual activity: Yes  Other Topics Concern   Not on file  Social History Narrative   Caffeine: 4-6 cups coffee   Lives with wife, 1 son, other grown children, dog and cows   Occupation: on disability for vision loss, legally blind, raises cows   Edu: HS   Activity: raises cows, some hunting/fishing   Diet: good water, daily fruits/vegetables   Social Determinants of Health   Financial Resource Strain: Not on file  Food Insecurity: Not on file  Transportation Needs: Not on file  Physical Activity: Not on file  Stress: Not on file  Social Connections: Not on file  Intimate Partner Violence: Not on file    FAMILY HISTORY: Family History  Problem Relation Age of Onset   Colon cancer Paternal Aunt 67   Leukemia Father        CLL spread to brain   Hypertension Father        and mother and family   Breast cancer Paternal Aunt    Prostate cancer Brother 46   Hyperlipidemia Other         father's side   CAD Paternal Uncle        MI, CABG   Stroke Maternal Grandmother    Diabetes Other        mother's side   Deep vein thrombosis Brother        with PE   Stroke Maternal Grandfather    Hypertension Mother    Esophageal cancer Neg Hx    Liver cancer Neg Hx    Pancreatic cancer Neg Hx    Rectal cancer Neg Hx    Stomach cancer Neg Hx     ALLERGIES:  is allergic to augmentin [amoxicillin-pot clavulanate].  MEDICATIONS:  Current Outpatient Medications  Medication Sig Dispense Refill   methocarbamol (ROBAXIN) 500 MG tablet Take 1 tablet (500 mg total) by mouth every 6 (six) hours as needed  for muscle spasms. 40 tablet 0   pilocarpine (PILOCAR) 1 % ophthalmic solution Place 1 drop into the left eye 2 (two) times daily.      celecoxib (CELEBREX) 200 MG capsule Take 1 capsule (200 mg total) by mouth 2 (two) times daily. 60 capsule 0   docusate sodium (COLACE) 100 MG capsule Take 1 capsule (100 mg total) by mouth 2 (two) times daily. 10 capsule 0   HYDROcodone-acetaminophen (NORCO) 7.5-325 MG tablet Take 1-2 tablets by mouth every 6 (six) hours as needed for severe pain (pain score 7-10). 42 tablet 0   polyethylene glycol (MIRALAX / GLYCOLAX) 17 g packet Take 17 g by mouth daily as needed for mild constipation. 14 each 0   No current facility-administered medications for this visit.    REVIEW OF SYSTEMS:   Constitutional: ( - ) fevers, ( - )  chills , ( - ) night sweats Eyes: ( - ) blurriness of vision, ( - ) double vision, ( - ) watery eyes Ears, nose, mouth, throat, and face: ( - ) mucositis, ( - ) sore throat Respiratory: ( - ) cough, ( - ) dyspnea, ( - ) wheezes Cardiovascular: ( - ) palpitation, ( - ) chest discomfort, ( - ) lower extremity swelling Gastrointestinal:  ( - ) nausea, ( - ) heartburn, ( - ) change in bowel habits Skin: ( - ) abnormal skin rashes Lymphatics: ( - ) new lymphadenopathy, ( - ) easy bruising Neurological: ( - ) numbness, ( - ) tingling, ( -  ) new weaknesses Behavioral/Psych: ( - ) mood change, ( - ) new changes  All other systems were reviewed with the patient and are negative.  PHYSICAL EXAMINATION:  Vitals:   06/21/21 1318  BP: (!) 151/91  Pulse: 100  Resp: 18  Temp: 97.6 F (36.4 C)  SpO2: 97%   Filed Weights   06/21/21 1318  Weight: 200 lb 1 oz (90.7 kg)    GENERAL: well appearing middle-aged Caucasian male in NAD  SKIN: skin color, texture, turgor are normal, no rashes or significant lesions EYES: conjunctiva are pink and non-injected, sclera clear LUNGS: clear to auscultation and percussion with normal breathing effort HEART: regular rate & rhythm and no murmurs and no lower extremity edema Musculoskeletal: no cyanosis of digits and no clubbing  PSYCH: alert & oriented x 3, fluent speech NEURO: no focal motor/sensory deficits  LABORATORY DATA:  I have reviewed the data as listed CBC Latest Ref Rng & Units 06/21/2021 04/28/2021 04/16/2021  WBC 4.0 - 10.5 K/uL 7.0 15.2(H) 7.1  Hemoglobin 13.0 - 17.0 g/dL 13.9 11.6(L) 14.9  Hematocrit 39.0 - 52.0 % 41.4 33.8(L) 42.8  Platelets 150 - 400 K/uL 301 244 264    CMP Latest Ref Rng & Units 04/28/2021 04/16/2021 03/12/2021  Glucose 70 - 99 mg/dL 154(H) 99 103(H)  BUN 6 - 20 mg/dL 14 11 13   Creatinine 0.61 - 1.24 mg/dL 0.95 0.79 0.90  Sodium 135 - 145 mmol/L 134(L) 138 141  Potassium 3.5 - 5.1 mmol/L 4.5 4.2 3.5  Chloride 98 - 111 mmol/L 104 105 105  CO2 22 - 32 mmol/L 25 26 28   Calcium 8.9 - 10.3 mg/dL 8.5(L) 8.9 8.9  Total Protein 6.5 - 8.1 g/dL - 6.9 6.3  Total Bilirubin 0.3 - 1.2 mg/dL - 0.9 1.1  Alkaline Phos 38 - 126 U/L - 71 61  AST 15 - 41 U/L - 28 24  ALT 0 - 44 U/L - 26 28  ASSESSMENT & PLAN Michael Vaughan 56 y.o. male with medical history significant for diagnosed hereditary hemochromatosis who presents to establish care.   After review of the labs, review of the records, and discussion with the patient the patients findings are most  consistent with hereditary hemochromatosis from a homozygous C282Y mutation.  Elevated serum ferritin levels have numerous possible etiologies. These include hereditary hemochromatosis (heterozygous or homozygous), inflammation, liver disease, or iron overload from an exogenous source. Hereditary hemochromatosis is a hereditary condition caused by mutations in the HFE gene, which regulates iron absorption. The most common genes mutated in this condition are the C282Y and H63D genes. Homozygous mutations represent a disease state which requires phlebotomy to decrease ferritin levels to a goal of <50  (Blood (2010) 116 (3): 317-325). The goal is to decrease ferritin so there is no deposition in critical organs (liver, heart, pancreas and thyroid). Heterozygous mutations (or compound heterozygotes) rarely require phlebotomy, but do have elevated serum iron/ferritin levels.  Ferritin is an acute phase reactant and can be elevated with systemic inflammation. Direct damage to liver tissue can also cause spillage of ferritin into the blood, resulting in elevated ferritin.  Additionally, serum iron levels can be quite transient and an elevation or serum iron may not represent a true overload of total body iron (best lab for this is ferritin).   # Elevated Iron/Ferritin #Homozygous Hereditary Hemochromatosis  --labs to include CBC, CMP --patient has prior testing for TSH/T4 and Hgb A1c --will order baseline TTE to assure no cardiac damage from iron deposition.  --will repeat iron panel and ferritin today --prior positive testing for HFE gene mutation. Found to have homozygous mutation for C282Y. -- no inidication for phlebotomy at this time. We will begin phlebotomies every other week if ferritin increases above 50.  --RTC in 6 months with interval 3 month labs.   Orders Placed This Encounter  Procedures   CBC with Differential (Cancer Center Only)    Standing Status:   Future    Number of Occurrences:   1     Standing Expiration Date:   06/21/2022   CMP (Cancer Center only)    Standing Status:   Future    Number of Occurrences:   1    Standing Expiration Date:   06/21/2022   Ferritin    Standing Status:   Future    Number of Occurrences:   1    Standing Expiration Date:   06/21/2022   Iron and Iron Binding Capacity ([facility] only)    Standing Status:   Future    Number of Occurrences:   1    Standing Expiration Date:   06/21/2022   CBC with Differential (Cancer Center Only)    Standing Status:   Standing    Number of Occurrences:   4    Standing Expiration Date:   06/21/2022   CMP (Cancer Center only)    Standing Status:   Standing    Number of Occurrences:   4    Standing Expiration Date:   06/21/2022   Ferritin    Standing Status:   Standing    Number of Occurrences:   4    Standing Expiration Date:   06/21/2022   Iron and Iron Binding Capacity ([facility] only)    Standing Status:   Standing    Number of Occurrences:   4    Standing Expiration Date:   06/21/2022   ECHOCARDIOGRAM COMPLETE    Standing Status:   Future  Standing Expiration Date:   06/21/2022    Order Specific Question:   Where should this test be performed    Answer:   [place] [facility]    Order Specific Question:   Perflutren DEFINITY (image enhancing agent) should be administered unless hypersensitivity or allergy exist    Answer:   Administer Perflutren    Order Specific Question:   Is a special reader required? (athlete or structural heart)    Answer:   No    Order Specific Question:   Does this study need to be read by the Structural team/Level 3 readers?    Answer:   No    Order Specific Question:   Reason for exam-Echo    Answer:   Other-Full Diagnosis List    Order Specific Question:   Full ICD-10/Reason for Exam    Answer:   Hereditary hemochromatosis ([place]) [275.01.ICD-9-CM]    Order Specific Question:   Other Comments    Answer:   patient has history of hereditary hemochromatosis, asking for assessment of cardiac  function to assure no damage from iron deposition.   All questions were answered. The patient knows to call the clinic with any problems, questions or concerns.  A total of more than 60 minutes were spent on this encounter with face-to-face time and non-face-to-face time, including preparing to see the patient, ordering tests and/or medications, counseling the patient and coordination of care as outlined above.   Ledell Peoples, MD Department of Hematology/Oncology [place] at [facility] Phone: [phone] Pager: [phone] Email: [email or web address]  06/21/2021 2:55 PM

## 2021-06-21 ENCOUNTER — Inpatient Hospital Stay: Payer: Medicare PPO | Attending: Hematology and Oncology | Admitting: Hematology and Oncology

## 2021-06-21 ENCOUNTER — Inpatient Hospital Stay: Payer: Medicare PPO

## 2021-06-21 ENCOUNTER — Other Ambulatory Visit: Payer: Self-pay

## 2021-06-21 ENCOUNTER — Encounter: Payer: Self-pay | Admitting: Hematology and Oncology

## 2021-06-21 DIAGNOSIS — Z800 Family history of malignant neoplasm of digestive organs: Secondary | ICD-10-CM | POA: Diagnosis not present

## 2021-06-21 DIAGNOSIS — H3552 Pigmentary retinal dystrophy: Secondary | ICD-10-CM | POA: Insufficient documentation

## 2021-06-21 DIAGNOSIS — M25559 Pain in unspecified hip: Secondary | ICD-10-CM | POA: Insufficient documentation

## 2021-06-21 DIAGNOSIS — R5383 Other fatigue: Secondary | ICD-10-CM | POA: Insufficient documentation

## 2021-06-21 DIAGNOSIS — Z791 Long term (current) use of non-steroidal anti-inflammatories (NSAID): Secondary | ICD-10-CM | POA: Diagnosis not present

## 2021-06-21 LAB — CBC WITH DIFFERENTIAL (CANCER CENTER ONLY)
Abs Immature Granulocytes: 0.02 10*3/uL (ref 0.00–0.07)
Basophils Absolute: 0.1 10*3/uL (ref 0.0–0.1)
Basophils Relative: 1 %
Eosinophils Absolute: 0.3 10*3/uL (ref 0.0–0.5)
Eosinophils Relative: 4 %
HCT: 41.4 % (ref 39.0–52.0)
Hemoglobin: 13.9 g/dL (ref 13.0–17.0)
Immature Granulocytes: 0 %
Lymphocytes Relative: 32 %
Lymphs Abs: 2.2 10*3/uL (ref 0.7–4.0)
MCH: 29.4 pg (ref 26.0–34.0)
MCHC: 33.6 g/dL (ref 30.0–36.0)
MCV: 87.7 fL (ref 80.0–100.0)
Monocytes Absolute: 0.5 10*3/uL (ref 0.1–1.0)
Monocytes Relative: 7 %
Neutro Abs: 3.9 10*3/uL (ref 1.7–7.7)
Neutrophils Relative %: 56 %
Platelet Count: 301 10*3/uL (ref 150–400)
RBC: 4.72 MIL/uL (ref 4.22–5.81)
RDW: 13 % (ref 11.5–15.5)
WBC Count: 7 10*3/uL (ref 4.0–10.5)
nRBC: 0 % (ref 0.0–0.2)

## 2021-06-21 LAB — CMP (CANCER CENTER ONLY)
ALT: 21 U/L (ref 0–44)
AST: 17 U/L (ref 15–41)
Albumin: 4.3 g/dL (ref 3.5–5.0)
Alkaline Phosphatase: 65 U/L (ref 38–126)
Anion gap: 9 (ref 5–15)
BUN: 16 mg/dL (ref 6–20)
CO2: 26 mmol/L (ref 22–32)
Calcium: 9.2 mg/dL (ref 8.9–10.3)
Chloride: 106 mmol/L (ref 98–111)
Creatinine: 0.89 mg/dL (ref 0.61–1.24)
GFR, Estimated: >60 mL/min (ref >60)
Glucose, Bld: 97 mg/dL (ref 70–99)
Potassium: 4.1 mmol/L (ref 3.5–5.1)
Sodium: 141 mmol/L (ref 135–145)
Total Bilirubin: 0.9 mg/dL (ref 0.3–1.2)
Total Protein: 7 g/dL (ref 6.5–8.1)

## 2021-06-21 LAB — IRON AND IRON BINDING CAPACITY (CC-WL,HP ONLY)
Iron: 39 ug/dL — ABNORMAL LOW (ref 45–182)
Saturation Ratios: 11 % — ABNORMAL LOW (ref 17.9–39.5)
TIBC: 342 ug/dL (ref 250–450)
UIBC: 303 ug/dL (ref 117–376)

## 2021-06-21 LAB — FERRITIN: Ferritin: 15 ng/mL — ABNORMAL LOW (ref 24–336)

## 2021-06-22 ENCOUNTER — Telehealth: Payer: Self-pay | Admitting: Hematology and Oncology

## 2021-06-22 NOTE — Telephone Encounter (Signed)
Scheduled per 1/9 los, pt has been called and confirmed appts

## 2021-07-14 DIAGNOSIS — Z471 Aftercare following joint replacement surgery: Secondary | ICD-10-CM | POA: Diagnosis not present

## 2021-07-14 DIAGNOSIS — Z96641 Presence of right artificial hip joint: Secondary | ICD-10-CM | POA: Diagnosis not present

## 2021-07-14 DIAGNOSIS — Z4789 Encounter for other orthopedic aftercare: Secondary | ICD-10-CM | POA: Diagnosis not present

## 2021-07-19 ENCOUNTER — Other Ambulatory Visit: Payer: Self-pay

## 2021-07-19 ENCOUNTER — Ambulatory Visit (HOSPITAL_COMMUNITY)
Admission: RE | Admit: 2021-07-19 | Discharge: 2021-07-19 | Disposition: A | Discharge status: Home / Self Care | Payer: Medicare PPO | Source: Ambulatory Visit | Attending: Hematology and Oncology | Admitting: Hematology and Oncology

## 2021-07-19 DIAGNOSIS — I361 Nonrheumatic tricuspid (valve) insufficiency: Secondary | ICD-10-CM

## 2021-07-19 DIAGNOSIS — I340 Nonrheumatic mitral (valve) insufficiency: Secondary | ICD-10-CM | POA: Diagnosis not present

## 2021-07-19 DIAGNOSIS — I3139 Other pericardial effusion (noninflammatory): Secondary | ICD-10-CM | POA: Insufficient documentation

## 2021-07-19 LAB — ECHOCARDIOGRAM COMPLETE
AR max vel: 3.38 cm2
AV Area VTI: 3.13 cm2
AV Area mean vel: 3.08 cm2
AV Mean grad: 2 mmHg
AV Peak grad: 3.9 mmHg
Ao pk vel: 0.99 m/s
Area-P 1/2: 5.62 cm2
S' Lateral: 3.2 cm

## 2021-07-19 NOTE — Progress Notes (Signed)
Echocardiogram 2D Echocardiogram has been performed.  Devonne Doughty 07/19/2021, 11:03 AM

## 2021-07-22 ENCOUNTER — Other Ambulatory Visit: Payer: Self-pay

## 2021-07-22 ENCOUNTER — Encounter (INDEPENDENT_AMBULATORY_CARE_PROVIDER_SITE_OTHER): Payer: Medicare PPO | Admitting: Ophthalmology

## 2021-07-22 DIAGNOSIS — H3552 Pigmentary retinal dystrophy: Secondary | ICD-10-CM

## 2021-07-22 DIAGNOSIS — H35371 Puckering of macula, right eye: Secondary | ICD-10-CM

## 2021-09-01 ENCOUNTER — Telehealth: Payer: Self-pay

## 2021-09-01 NOTE — Telephone Encounter (Signed)
Left message for patient to call back  

## 2021-09-01 NOTE — Telephone Encounter (Signed)
-----   Message from Annett Fabian, RN sent at 09/01/2021  2:34 PM EDT ----- ? ?----- Message ----- ?From: Antony Blackbird, RN ?Sent: 09/01/2021  12:00 AM EDT ?To: Annett Fabian, RN ? ?Needs 6 month repeat CBC, IBC & Fierritin = 09/09/21. Order in Epic ? ? ?

## 2021-09-02 NOTE — Telephone Encounter (Signed)
Called to remind patient about the need for lab work soon & he stated that he will be getting his labs through the cancer center. He has a follow up with Dr. Lorenso Courier in the cancer center soon & will have it done then.  ?

## 2021-09-20 ENCOUNTER — Other Ambulatory Visit: Payer: Self-pay

## 2021-09-20 ENCOUNTER — Inpatient Hospital Stay: Payer: Medicare PPO | Attending: Hematology and Oncology

## 2021-09-20 LAB — CMP (CANCER CENTER ONLY)
ALT: 20 U/L (ref 0–44)
AST: 18 U/L (ref 15–41)
Albumin: 4.1 g/dL (ref 3.5–5.0)
Alkaline Phosphatase: 66 U/L (ref 38–126)
Anion gap: 7 (ref 5–15)
BUN: 13 mg/dL (ref 6–20)
CO2: 26 mmol/L (ref 22–32)
Calcium: 9 mg/dL (ref 8.9–10.3)
Chloride: 106 mmol/L (ref 98–111)
Creatinine: 0.87 mg/dL (ref 0.61–1.24)
GFR, Estimated: >60 mL/min (ref >60)
Glucose, Bld: 124 mg/dL — ABNORMAL HIGH (ref 70–99)
Potassium: 3.7 mmol/L (ref 3.5–5.1)
Sodium: 139 mmol/L (ref 135–145)
Total Bilirubin: 1 mg/dL (ref 0.3–1.2)
Total Protein: 6.9 g/dL (ref 6.5–8.1)

## 2021-09-20 LAB — FERRITIN: Ferritin: 21 ng/mL — ABNORMAL LOW (ref 24–336)

## 2021-09-20 LAB — CBC WITH DIFFERENTIAL (CANCER CENTER ONLY)
Abs Immature Granulocytes: 0.02 10*3/uL (ref 0.00–0.07)
Basophils Absolute: 0.1 10*3/uL (ref 0.0–0.1)
Basophils Relative: 1 %
Eosinophils Absolute: 0.2 10*3/uL (ref 0.0–0.5)
Eosinophils Relative: 3 %
HCT: 42.9 % (ref 39.0–52.0)
Hemoglobin: 14.7 g/dL (ref 13.0–17.0)
Immature Granulocytes: 0 %
Lymphocytes Relative: 30 %
Lymphs Abs: 2.1 10*3/uL (ref 0.7–4.0)
MCH: 30 pg (ref 26.0–34.0)
MCHC: 34.3 g/dL (ref 30.0–36.0)
MCV: 87.6 fL (ref 80.0–100.0)
Monocytes Absolute: 0.4 10*3/uL (ref 0.1–1.0)
Monocytes Relative: 7 %
Neutro Abs: 4 10*3/uL (ref 1.7–7.7)
Neutrophils Relative %: 59 %
Platelet Count: 269 10*3/uL (ref 150–400)
RBC: 4.9 MIL/uL (ref 4.22–5.81)
RDW: 14.5 % (ref 11.5–15.5)
WBC Count: 6.8 10*3/uL (ref 4.0–10.5)
nRBC: 0 % (ref 0.0–0.2)

## 2021-09-20 LAB — IRON AND IRON BINDING CAPACITY (CC-WL,HP ONLY)
Iron: 62 ug/dL (ref 45–182)
Saturation Ratios: 20 % (ref 17.9–39.5)
TIBC: 309 ug/dL (ref 250–450)
UIBC: 247 ug/dL (ref 117–376)

## 2021-12-20 ENCOUNTER — Other Ambulatory Visit: Payer: Self-pay | Admitting: *Deleted

## 2021-12-20 ENCOUNTER — Other Ambulatory Visit: Payer: Self-pay

## 2021-12-20 ENCOUNTER — Inpatient Hospital Stay (HOSPITAL_BASED_OUTPATIENT_CLINIC_OR_DEPARTMENT_OTHER): Payer: Medicare PPO | Admitting: Hematology and Oncology

## 2021-12-20 ENCOUNTER — Inpatient Hospital Stay: Payer: Medicare PPO | Attending: Hematology and Oncology

## 2021-12-20 DIAGNOSIS — Z791 Long term (current) use of non-steroidal anti-inflammatories (NSAID): Secondary | ICD-10-CM | POA: Insufficient documentation

## 2021-12-20 DIAGNOSIS — H3552 Pigmentary retinal dystrophy: Secondary | ICD-10-CM | POA: Diagnosis not present

## 2021-12-20 DIAGNOSIS — Z800 Family history of malignant neoplasm of digestive organs: Secondary | ICD-10-CM | POA: Diagnosis not present

## 2021-12-20 DIAGNOSIS — Z803 Family history of malignant neoplasm of breast: Secondary | ICD-10-CM | POA: Diagnosis not present

## 2021-12-20 DIAGNOSIS — Z87891 Personal history of nicotine dependence: Secondary | ICD-10-CM | POA: Diagnosis not present

## 2021-12-20 LAB — CBC WITH DIFFERENTIAL (CANCER CENTER ONLY)
Abs Immature Granulocytes: 0.02 10*3/uL (ref 0.00–0.07)
Basophils Absolute: 0.1 10*3/uL (ref 0.0–0.1)
Basophils Relative: 1 %
Eosinophils Absolute: 0.2 10*3/uL (ref 0.0–0.5)
Eosinophils Relative: 3 %
HCT: 44.6 % (ref 39.0–52.0)
Hemoglobin: 16.2 g/dL (ref 13.0–17.0)
Immature Granulocytes: 0 %
Lymphocytes Relative: 28 %
Lymphs Abs: 1.7 10*3/uL (ref 0.7–4.0)
MCH: 33 pg (ref 26.0–34.0)
MCHC: 36.3 g/dL — ABNORMAL HIGH (ref 30.0–36.0)
MCV: 90.8 fL (ref 80.0–100.0)
Monocytes Absolute: 0.5 10*3/uL (ref 0.1–1.0)
Monocytes Relative: 7 %
Neutro Abs: 3.6 10*3/uL (ref 1.7–7.7)
Neutrophils Relative %: 61 %
Platelet Count: 232 10*3/uL (ref 150–400)
RBC: 4.91 MIL/uL (ref 4.22–5.81)
RDW: 13.6 % (ref 11.5–15.5)
WBC Count: 6.1 10*3/uL (ref 4.0–10.5)
nRBC: 0 % (ref 0.0–0.2)

## 2021-12-20 LAB — CMP (CANCER CENTER ONLY)
ALT: 20 U/L (ref 0–44)
AST: 23 U/L (ref 15–41)
Albumin: 4.3 g/dL (ref 3.5–5.0)
Alkaline Phosphatase: 61 U/L (ref 38–126)
Anion gap: 7 (ref 5–15)
BUN: 10 mg/dL (ref 6–20)
CO2: 26 mmol/L (ref 22–32)
Calcium: 9.4 mg/dL (ref 8.9–10.3)
Chloride: 107 mmol/L (ref 98–111)
Creatinine: 0.87 mg/dL (ref 0.61–1.24)
GFR, Estimated: >60 mL/min (ref >60)
Glucose, Bld: 101 mg/dL — ABNORMAL HIGH (ref 70–99)
Potassium: 4.2 mmol/L (ref 3.5–5.1)
Sodium: 140 mmol/L (ref 135–145)
Total Bilirubin: 1.1 mg/dL (ref 0.3–1.2)
Total Protein: 7 g/dL (ref 6.5–8.1)

## 2021-12-20 LAB — IRON AND IRON BINDING CAPACITY (CC-WL,HP ONLY)
Iron: 97 ug/dL (ref 45–182)
Saturation Ratios: 35 % (ref 17.9–39.5)
TIBC: 281 ug/dL (ref 250–450)
UIBC: 184 ug/dL (ref 117–376)

## 2021-12-20 LAB — FERRITIN: Ferritin: 38 ng/mL (ref 24–336)

## 2021-12-20 NOTE — Progress Notes (Signed)
[facility] [phone]   [phone]  PROGRESS NOTE  Patient Care Team: Eustaquio Boyden, MD as PCP - General (Family Medicine) Sherrie George, MD as Consulting Physician (Ophthalmology) Meryl Dare, MD as Consulting Physician (Gastroenterology)  Hematological/Oncological History # Hereditary Hemochromatosis- Homozygous for C282Y  06/27/2013: HFE testing showed patient was homozygous for C282Y, ferritin >1500 06/19/13- 04/09/2018: underwent phlebotomies, Ferritin decreased to 41.7 on 05/09/2018 03/12/2021: Ferritin 17 06/21/2021: establish care with Dr [name]   Interval History:  Michael Vaughan 56 y.o. male with medical history significant for hereditary hemochromatosis who presents for a follow up visit. The patient's last visit was on 06/21/2021 at which time he established care. In the interim since the last visit he has not required any phlebotomies.  On exam today Michael Vaughan reports that his energy has been quite good in the interim since her last visit.  He notes his energy is about a 7 or 8 out of 10.  His appetite is also been good.  He is not been having any issues with lightheadedness, dizziness, or shortness of breath.  He notes that the only medication he is currently on is pilocarpine.  He is not currently on any of those medications following his hip surgery.  He reports he is not having difficulty with pain following his hip surgery.  He notes that he eats red meat but tends to only eat about once or twice per week.  He notes that he is able to do his day-to-day activities without any difficulty.  Overall he feels well and has no questions concerns or complaints.  He denies any fevers, chills, sweats, nausea, vomiting or diarrhea.  Full 10 point ROS is listed below.  MEDICAL HISTORY:  Past Medical History:  Diagnosis Date   Allergy    Arthralgia    takes aleve regularly   Arthritis    Cataract    Heartburn    Hereditary  hemochromatosis (HCC) 05/2013   iron overload with mild HM, homozygous for C282Y, monthly blood draws   History of chicken pox    History of migraine 1990s   remote   Retinitis pigmentosa     SURGICAL HISTORY: Past Surgical History:  Procedure Laterality Date   CATARACT EXTRACTION BILATERAL W/ ANTERIOR VITRECTOMY Bilateral 2001   COLONOSCOPY  12/2016   WNL [name])   INGUINAL HERNIA REPAIR  1972   at age 88   IRIDECTOMY Right 01/14/2014   Procedure: IRIDECTOMY;  Surgeon: Sherrie George, MD;  Location: [facility] OR;  Service: Ophthalmology;  Laterality: Right;   PARS PLANA VITRECTOMY  01/24/2012   Procedure: PARS PLANA VITRECTOMY WITH 25G REMOVAL/SUTURE INTRAOCULAR LENS;  Surgeon: Sherrie George, MD;  Location: [facility] OR;  Service: Ophthalmology;  Laterality: Left;   PARS PLANA VITRECTOMY Right 01/14/2014   dislocated intraocular lens - PARS PLANA VITRECTOMY WITH 25G REMOVAL/SUTURE SECONDARY INTRAOCULAR LENS;  Sherrie George, MD   PHOTOCOAGULATION WITH LASER Right 01/14/2014   Procedure: PHOTOCOAGULATION WITH LASER;  Surgeon: Sherrie George, MD;  Location: [facility] OR;  Service: Ophthalmology;  Laterality: Right;   TOTAL HIP ARTHROPLASTY Right 04/27/2021   Procedure: TOTAL HIP ARTHROPLASTY ANTERIOR APPROACH;  Surgeon: Durene Romans, MD;  Location: [facility] ORS;  Service: Orthopedics;  Laterality: Right;    SOCIAL HISTORY: Social History   Socioeconomic History   Marital status: Single    Spouse name: Not on file   Number of children: 2   Years of education: Not on file   Highest  education level: Not on file  Occupational History   Occupation: disabled  Tobacco Use   Smoking status: Former    Packs/day: 1.00    Types: Cigarettes    Quit date: 03/12/2004    Years since quitting: 17.7   Smokeless tobacco: Current    Types: Snuff    Last attempt to quit: 10/12/2015   Tobacco comments:    quit in 2005, occasional smokeless tobacco - now using the [employer] tobacco free snuff  Vaping Use   Vaping  Use: Never used  Substance and Sexual Activity   Alcohol use: Yes    Alcohol/week: 0.0 standard drinks of alcohol    Comment: rarely   Drug use: No   Sexual activity: Yes  Other Topics Concern   Not on file  Social History Narrative   Caffeine: 4-6 cups coffee   Lives with wife, 1 son, other grown children, dog and cows   Occupation: on disability for vision loss, legally blind, raises cows   Edu: HS   Activity: raises cows, some hunting/fishing   Diet: good water, daily fruits/vegetables   Social Determinants of Health   Financial Resource Strain: Not on file  Food Insecurity: Not on file  Transportation Needs: Not on file  Physical Activity: Not on file  Stress: Not on file  Social Connections: Not on file  Intimate Partner Violence: Not on file    FAMILY HISTORY: Family History  Problem Relation Age of Onset   Colon cancer Paternal Aunt 38   Leukemia Father        CLL spread to brain   Hypertension Father        and mother and family   Breast cancer Paternal Aunt    Prostate cancer Brother 49   Hyperlipidemia Other        father's side   CAD Paternal Uncle        MI, CABG   Stroke Maternal Grandmother    Diabetes Other        mother's side   Deep vein thrombosis Brother        with PE   Stroke Maternal Grandfather    Hypertension Mother    Esophageal cancer Neg Hx    Liver cancer Neg Hx    Pancreatic cancer Neg Hx    Rectal cancer Neg Hx    Stomach cancer Neg Hx     ALLERGIES:  is allergic to augmentin [amoxicillin-pot clavulanate].  MEDICATIONS:  Current Outpatient Medications  Medication Sig Dispense Refill   celecoxib (CELEBREX) 200 MG capsule Take 1 capsule (200 mg total) by mouth 2 (two) times daily. 60 capsule 0   docusate sodium (COLACE) 100 MG capsule Take 1 capsule (100 mg total) by mouth 2 (two) times daily. 10 capsule 0   HYDROcodone-acetaminophen (NORCO) 7.5-325 MG tablet Take 1-2 tablets by mouth every 6 (six) hours as needed for severe  pain (pain score 7-10). 42 tablet 0   methocarbamol (ROBAXIN) 500 MG tablet Take 1 tablet (500 mg total) by mouth every 6 (six) hours as needed for muscle spasms. 40 tablet 0   pilocarpine (PILOCAR) 1 % ophthalmic solution Place 1 drop into the left eye 2 (two) times daily.      polyethylene glycol (MIRALAX / GLYCOLAX) 17 g packet Take 17 g by mouth daily as needed for mild constipation. 14 each 0   No current facility-administered medications for this visit.    REVIEW OF SYSTEMS:   Constitutional: ( - ) fevers, ( - )  chills , ( - ) night sweats Eyes: ( - ) blurriness of vision, ( - ) double vision, ( - ) watery eyes Ears, nose, mouth, throat, and face: ( - ) mucositis, ( - ) sore throat Respiratory: ( - ) cough, ( - ) dyspnea, ( - ) wheezes Cardiovascular: ( - ) palpitation, ( - ) chest discomfort, ( - ) lower extremity swelling Gastrointestinal:  ( - ) nausea, ( - ) heartburn, ( - ) change in bowel habits Skin: ( - ) abnormal skin rashes Lymphatics: ( - ) new lymphadenopathy, ( - ) easy bruising Neurological: ( - ) numbness, ( - ) tingling, ( - ) new weaknesses Behavioral/Psych: ( - ) mood change, ( - ) new changes  All other systems were reviewed with the patient and are negative.  PHYSICAL EXAMINATION:  Vitals:   12/20/21 1113  BP: (!) 147/93  Pulse: 71  Resp: 16  Temp: 97.7 F (36.5 C)  SpO2: 97%   Filed Weights   12/20/21 1113  Weight: 197 lb 6.4 oz (89.5 kg)    GENERAL: Well-appearing middle-age Caucasian male, alert, no distress and comfortable SKIN: skin color, texture, turgor are normal, no rashes or significant lesions EYES: conjunctiva are pink and non-injected, sclera clear LUNGS: clear to auscultation and percussion with normal breathing effort HEART: regular rate & rhythm and no murmurs and no lower extremity edema Musculoskeletal: no cyanosis of digits and no clubbing  PSYCH: alert & oriented x 3, fluent speech NEURO: no focal motor/sensory  deficits  LABORATORY DATA:  I have reviewed the data as listed    Latest Ref Rng & Units 12/20/2021   10:45 AM 09/20/2021   10:24 AM 06/21/2021    2:17 PM  CBC  WBC 4.0 - 10.5 K/uL 6.1  6.8  7.0   Hemoglobin 13.0 - 17.0 g/dL 26.9  48.5  46.2   Hematocrit 39.0 - 52.0 % 44.6  42.9  41.4   Platelets 150 - 400 K/uL 232  269  301        Latest Ref Rng & Units 12/20/2021   10:45 AM 09/20/2021   10:24 AM 06/21/2021    2:17 PM  CMP  Glucose 70 - 99 mg/dL 703  500  97   BUN 6 - 20 mg/dL 10  13  16    Creatinine 0.61 - 1.24 mg/dL  9.38  1.82   Sodium 135 - 145 mmol/L 140  139  141   Potassium 3.5 - 5.1 mmol/L 4.2  3.7  4.1   Chloride 98 - 111 mmol/L 107  106  106   CO2 22 - 32 mmol/L 26  26  26    Calcium 8.9 - 10.3 mg/dL 9.4  9.0  9.2   Total Protein 6.5 - 8.1 g/dL 7.0  6.9  7.0   Total Bilirubin 0.3 - 1.2 mg/dL 1.1  1.0  0.9   Alkaline Phos 38 - 126 U/L 61  66  65   AST 15 - 41 U/L 23  18  17    ALT 0 - 44 U/L 20  20  21     RADIOGRAPHIC STUDIES: No results found.  ASSESSMENT & PLAN Michael Vaughan 56 y.o. male with medical history significant for hereditary hemochromatosis who presents for a follow up visit.   After review of the labs, review of the records, and discussion with the patient the patients findings are most consistent with hereditary hemochromatosis from a homozygous C282Y mutation.   Elevated serum  ferritin levels have numerous possible etiologies. These include hereditary hemochromatosis (heterozygous or homozygous), inflammation, liver disease, or iron overload from an exogenous source. Hereditary hemochromatosis is a hereditary condition caused by mutations in the HFE gene, which regulates iron absorption. The most common genes mutated in this condition are the C282Y and H63D genes. Homozygous mutations represent a disease state which requires phlebotomy to decrease ferritin levels to a goal of <50  (Blood (2010) 116 (3): 317-325). The goal is to decrease ferritin so  there is no deposition in critical organs (liver, heart, pancreas and thyroid). Heterozygous mutations (or compound heterozygotes) rarely require phlebotomy, but do have elevated serum iron/ferritin levels.  Ferritin is an acute phase reactant and can be elevated with systemic inflammation. Direct damage to liver tissue can also cause spillage of ferritin into the blood, resulting in elevated ferritin.  Additionally, serum iron levels can be quite transient and an elevation or serum iron may not represent a true overload of total body iron (best lab for this is ferritin).     #Homozygous Hereditary Hemochromatosis  --labs to include CBC, CMP --patient has prior testing for TSH/T4 and Hgb A1c --will repeat iron panel and ferritin today --prior positive testing for HFE gene mutation. Found to have homozygous mutation for C282Y. -- no inidication for phlebotomy at this time. We will begin phlebotomies every other week if ferritin increases above 150.  --Labs today show white blood cell count 6.1, hemoglobin 16.2, MCV 90.8, and platelets of 232 --RTC in 6 months with interval 3 month labs.     No orders of the defined types were placed in this encounter.   All questions were answered. The patient knows to call the clinic with any problems, questions or concerns.  A total of more than 30 minutes were spent on this encounter with face-to-face time and non-face-to-face time, including preparing to see the patient, ordering tests and/or medications, counseling the patient and coordination of care as outlined above.   Michael Barns, MD Department of Hematology/Oncology [facility] Phone: [phone] Pager: [phone] Email: [email or web address]  12/20/2021 11:30 AM

## 2021-12-22 ENCOUNTER — Ambulatory Visit (INDEPENDENT_AMBULATORY_CARE_PROVIDER_SITE_OTHER): Payer: Medicare PPO | Admitting: Family Medicine

## 2021-12-22 ENCOUNTER — Encounter: Payer: Self-pay | Admitting: Family Medicine

## 2021-12-22 VITALS — BP 134/90 | HR 69 | Temp 97.9°F | Ht 68.5 in | Wt 197.5 lb

## 2021-12-22 DIAGNOSIS — Z0000 Encounter for general adult medical examination without abnormal findings: Secondary | ICD-10-CM

## 2021-12-22 DIAGNOSIS — H3552 Pigmentary retinal dystrophy: Secondary | ICD-10-CM

## 2021-12-22 DIAGNOSIS — Z23 Encounter for immunization: Secondary | ICD-10-CM

## 2021-12-22 DIAGNOSIS — Z7189 Other specified counseling: Secondary | ICD-10-CM

## 2021-12-22 DIAGNOSIS — E038 Other specified hypothyroidism: Secondary | ICD-10-CM | POA: Diagnosis not present

## 2021-12-22 DIAGNOSIS — E785 Hyperlipidemia, unspecified: Secondary | ICD-10-CM | POA: Diagnosis not present

## 2021-12-22 DIAGNOSIS — L570 Actinic keratosis: Secondary | ICD-10-CM | POA: Insufficient documentation

## 2021-12-22 LAB — LIPID PANEL
Cholesterol: 119 mg/dL (ref 0–200)
HDL: 62.2 mg/dL (ref >39.00)
LDL Cholesterol: 45 mg/dL (ref 0–99)
NonHDL: 57.05
Total CHOL/HDL Ratio: 2
Triglycerides: 62 mg/dL (ref 0.0–149.0)
VLDL: 12.4 mg/dL (ref 0.0–40.0)

## 2021-12-22 LAB — TSH: TSH: 4.69 u[IU]/mL (ref 0.35–5.50)

## 2021-12-22 LAB — T4, FREE: Free T4: 1.01 ng/dL (ref 0.60–1.60)

## 2021-12-22 NOTE — Assessment & Plan Note (Signed)
Appreciate heme care.  No recent need for phlebotomy.

## 2021-12-22 NOTE — Patient Instructions (Addendum)
Labs today.  First shingles shot today. Return in 2-6 months for 2nd and final shot.  Advanced directive packet provided today.   We will refer you to skin doctor for skin cancer screen and AKs (actinic keratoses).   You are doing well today Return as needed or in 1  year for next physical.   Health Maintenance, Male Adopting a healthy lifestyle and getting preventive care are important in promoting health and wellness. Ask your health care provider about: The right schedule for you to have regular tests and exams. Things you can do on your own to prevent diseases and keep yourself healthy. What should I know about diet, weight, and exercise? Eat a healthy diet  Eat a diet that includes plenty of vegetables, fruits, low-fat dairy products, and lean protein. Do not eat a lot of foods that are high in solid fats, added sugars, or sodium. Maintain a healthy weight Body mass index (BMI) is a measurement that can be used to identify possible weight problems. It estimates body fat based on height and weight. Your health care provider can help determine your BMI and help you achieve or maintain a healthy weight. Get regular exercise Get regular exercise. This is one of the most important things you can do for your health. Most adults should: Exercise for at least 150 minutes each week. The exercise should increase your heart rate and make you sweat (moderate-intensity exercise). Do strengthening exercises at least twice a week. This is in addition to the moderate-intensity exercise. Spend less time sitting. Even light physical activity can be beneficial. Watch cholesterol and blood lipids Have your blood tested for lipids and cholesterol at 57 years of age, then have this test every 5 years. You may need to have your cholesterol levels checked more often if: Your lipid or cholesterol levels are high. You are older than 57 years of age. You are at high risk for heart disease. What should I  know about cancer screening? Many types of cancers can be detected early and may often be prevented. Depending on your health history and family history, you may need to have cancer screening at various ages. This may include screening for: Colorectal cancer. Prostate cancer. Skin cancer. Lung cancer. What should I know about heart disease, diabetes, and high blood pressure? Blood pressure and heart disease High blood pressure causes heart disease and increases the risk of stroke. This is more likely to develop in people who have high blood pressure readings or are overweight. Talk with your health care provider about your target blood pressure readings. Have your blood pressure checked: Every 3-5 years if you are 71-69 years of age. Every year if you are 4 years old or older. If you are between the ages of 73 and 82 and are a current or former smoker, ask your health care provider if you should have a one-time screening for abdominal aortic aneurysm (AAA). Diabetes Have regular diabetes screenings. This checks your fasting blood sugar level. Have the screening done: Once every three years after age 45 if you are at a normal weight and have a low risk for diabetes. More often and at a younger age if you are overweight or have a high risk for diabetes. What should I know about preventing infection? Hepatitis B If you have a higher risk for hepatitis B, you should be screened for this virus. Talk with your health care provider to find out if you are at risk for hepatitis B infection. Hepatitis  C Blood testing is recommended for: Everyone born from [birth year] through 1966. Anyone with known risk factors for hepatitis C. Sexually transmitted infections (STIs) You should be screened each year for STIs, including gonorrhea and chlamydia, if: You are sexually active and are younger than 57 years of age. You are older than 57 years of age and your health care provider tells you that you are at risk  for this type of infection. Your sexual activity has changed since you were last screened, and you are at increased risk for chlamydia or gonorrhea. Ask your health care provider if you are at risk. Ask your health care provider about whether you are at high risk for HIV. Your health care provider may recommend a prescription medicine to help prevent HIV infection. If you choose to take medicine to prevent HIV, you should first get tested for HIV. You should then be tested every 3 months for as long as you are taking the medicine. Follow these instructions at home: Alcohol use Do not drink alcohol if your health care provider tells you not to drink. If you drink alcohol: Limit how much you have to 0-2 drinks a day. Know how much alcohol is in your drink. In the [place] one drink equals one 12 oz bottle of beer (355 mL), one 5 oz glass of wine (148 mL), or one 1 oz glass of hard liquor (44 mL). Lifestyle Do not use any products that contain nicotine or tobacco. These products include cigarettes, chewing tobacco, and vaping devices, such as e-cigarettes. If you need help quitting, ask your health care provider. Do not use street drugs. Do not share needles. Ask your health care provider for help if you need support or information about quitting drugs. General instructions Schedule regular health, dental, and eye exams. Stay current with your vaccines. Tell your health care provider if: You often feel depressed. You have ever been abused or do not feel safe at home. Summary Adopting a healthy lifestyle and getting preventive care are important in promoting health and wellness. Follow your health care provider's instructions about healthy diet, exercising, and getting tested or screened for diseases. Follow your health care provider's instructions on monitoring your cholesterol and blood pressure. This information is not intended to replace advice given to you by your health care provider. Make  sure you discuss any questions you have with your health care provider. Document Revised: 10/19/2020 Document Reviewed: 10/19/2020 Elsevier Patient Education  [place].

## 2021-12-22 NOTE — Assessment & Plan Note (Signed)
Update levels off replacement. 

## 2021-12-22 NOTE — Assessment & Plan Note (Signed)
Update FLP off medication. The ASCVD Risk score (Arnett DK, et al., 2019) failed to calculate for the following reasons:   The valid total cholesterol range is 130 to 320 mg/dL

## 2021-12-22 NOTE — Assessment & Plan Note (Signed)
Refer to derm  ?

## 2021-12-22 NOTE — Assessment & Plan Note (Signed)

## 2021-12-22 NOTE — Assessment & Plan Note (Signed)
Sees retinologist yearly.

## 2021-12-22 NOTE — Assessment & Plan Note (Addendum)
Advanced directive - has not set up yet. Would want brother and 2 sons to be HCPOA. Packet provided today.

## 2021-12-22 NOTE — Assessment & Plan Note (Signed)
Preventative protocols reviewed and updated unless pt declined. Discussed healthy diet and lifestyle.  

## 2021-12-22 NOTE — Progress Notes (Signed)
Patient ID: Michael Vaughan, male    DOB: [birth year], 56 y.o.   MRN: [id number]  This visit was conducted in person.  BP 134/90   Pulse 69   Temp 97.9 F (36.6 C) (Temporal)   Ht 5' 8.5" (1.74 m)   Wt 197 lb 8 oz (89.6 kg)   SpO2 97%   BMI 29.59 kg/m   BP Readings from Last 3 Encounters:  12/22/21 134/90  12/20/21 (!) 147/93  06/21/21 (!) 151/91   CC: AMW Subjective:   HPI: Michael Vaughan is a 56 y.o. male presenting on 12/22/2021 for Medicare Wellness   Did not see health advisor this year.   Hearing Screening   500Hz  1000Hz  2000Hz  4000Hz   Right ear 20 20 20  40  Left ear 20 20 20  0  Vision Screening - Comments:: Last eye exam, 07/2021.  Flowsheet Row Office Visit from 12/22/2021 in [facility] [place]  PHQ-2 Total Score 0          12/22/2021    8:58 AM 12/21/2020    9:08 AM 12/19/2019    8:37 AM 12/06/2017    5:44 PM 10/27/2015    8:25 AM  Fall Risk   Falls in the past year? 0 0 0 No No  Follow up  Falls evaluation completed       H/o retinitis pigmentosa followed by eye doctor yearly Michael Vaughan) - he no longer drives (since 2000).   Hemochromatosis with iron overload homozygous for C282Y mutation, has established with heme (Dr [name]). Last seen Monday.   S/p R hip replacement 04/2021 [name]).    Preventative: COLONOSCOPY 12/2016 WNL [name]).  Prostate cancer screening - brother developed prostate cancer at age 2 yo. Continue yearly screening.  Lung cancer screening - not eligible  Flu shot yearly  COVID vaccine - Pfizer 08/2019 x2, booster 03/2021. 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  Advanced directive - has not set up yet. Would want brother and 2 sons to be HCPOA. Packet provided today.  Seat belt use discussed.  Sunscreen use discussed. No changing moles on skin. Notes rough spots on skin.  Sleep - averaging 7 hours/night Ex smoker - quit 2005   Alcohol - 2 beers/night  Dentist - dental work this past year  Eye exam - sees retinologist Michael Vaughan yearly (retinitis pigmentosa)  Bowel - no constipation Bladder - no incontinence  Caffeine: 4-6 cups coffee  Separated from wife  Lives with 1 son, has other grown children, dog, chickens and cows Occupation: on disability for vision loss, legally blind, raises cows Edu: HS  Activity: stays active on farm, raises cows, some hunting/fishing  Diet: good water, daily fruits/vegetables      Relevant past medical, surgical, family and social history reviewed and updated as indicated. Interim medical history since our last visit reviewed. Allergies and medications reviewed and updated. Outpatient Medications Prior to Visit  Medication Sig Dispense Refill   pilocarpine (PILOCAR) 1 % ophthalmic solution Place 1 drop into the left eye 2 (two) times daily.      No facility-administered medications prior to visit.     Per HPI unless specifically indicated in ROS section below Review of Systems  Constitutional:  Negative for activity change, appetite change, chills, fatigue, fever and unexpected weight change.  HENT:  Negative for hearing loss.   Eyes:  Negative for visual disturbance.  Respiratory:  Negative for cough, chest  tightness, shortness of breath and wheezing.   Cardiovascular:  Negative for chest pain, palpitations and leg swelling.  Gastrointestinal:  Negative for abdominal distention, abdominal pain, blood in stool, constipation, diarrhea, nausea and vomiting.  Genitourinary:  Negative for difficulty urinating and hematuria.  Musculoskeletal:  Negative for arthralgias, myalgias and neck pain.  Skin:  Negative for rash.  Neurological:  Negative for dizziness, seizures, syncope and headaches.  Hematological:  Negative for adenopathy. Does not bruise/bleed easily.  Psychiatric/Behavioral:  Negative for dysphoric mood. The patient is not nervous/anxious.     Objective:  BP  134/90   Pulse 69   Temp 97.9 F (36.6 C) (Temporal)   Ht 5' 8.5" (1.74 m)   Wt 197 lb 8 oz (89.6 kg)   SpO2 97%   BMI 29.59 kg/m   Wt Readings from Last 3 Encounters:  12/22/21 197 lb 8 oz (89.6 kg)  12/20/21 197 lb 6.4 oz (89.5 kg)  06/21/21 200 lb 1 oz (90.7 kg)      Physical Exam Vitals and nursing note reviewed.  Constitutional:      General: He is not in acute distress.    Appearance: Normal appearance. He is well-developed. He is not ill-appearing.  HENT:     Head: Normocephalic and atraumatic.     Right Ear: Hearing, tympanic membrane, ear canal and external ear normal.     Left Ear: Hearing, tympanic membrane, ear canal and external ear normal.  Eyes:     General: No scleral icterus.    Extraocular Movements: Extraocular movements intact.     Conjunctiva/sclera: Conjunctivae normal.     Pupils: Pupils are equal, round, and reactive to light.  Neck:     Thyroid: No thyroid mass or thyromegaly.  Cardiovascular:     Rate and Rhythm: Normal rate and regular rhythm.     Pulses: Normal pulses.          Radial pulses are 2+ on the right side and 2+ on the left side.     Heart sounds: Normal heart sounds. No murmur heard. Pulmonary:     Effort: Pulmonary effort is normal. No respiratory distress.     Breath sounds: Normal breath sounds. No wheezing, rhonchi or rales.  Abdominal:     General: Bowel sounds are normal. There is no distension.     Palpations: Abdomen is soft. There is no mass.     Tenderness: There is no abdominal tenderness. There is no guarding or rebound.     Hernia: No hernia is present.  Musculoskeletal:        General: Normal range of motion.     Cervical back: Normal range of motion and neck supple.     Right lower leg: No edema.     Left lower leg: No edema.  Lymphadenopathy:     Cervical: No cervical adenopathy.  Skin:    General: Skin is warm and dry.     Findings: No rash.  Neurological:     General: No focal deficit present.     Mental  Status: He is alert and oriented to person, place, and time.     Comments:  Recall 3/3 Calculation 5/5 DLROW  Psychiatric:        Mood and Affect: Mood normal.        Behavior: Behavior normal.        Thought Content: Thought content normal.        Judgment: Judgment normal.       Results for  orders placed or performed in visit on 12/20/21  CBC with Differential (Cancer Center Only)  Result Value Ref Range   WBC Count 6.1 4.0 - 10.5 K/uL   RBC 4.91 4.22 - 5.81 MIL/uL   Hemoglobin 16.2 13.0 - 17.0 g/dL   HCT 79.0 38.3 - 33.8 %   MCV 90.8 80.0 - 100.0 fL   MCH 33.0 26.0 - 34.0 pg   MCHC 36.3 (H) 30.0 - 36.0 g/dL   RDW 32.9 19.1 - 66.0 %   Platelet Count 232 150 - 400 K/uL   nRBC 0.0 0.0 - 0.2 %   Neutrophils Relative % 61 %   Neutro Abs 3.6 1.7 - 7.7 K/uL   Lymphocytes Relative 28 %   Lymphs Abs 1.7 0.7 - 4.0 K/uL   Monocytes Relative 7 %   Monocytes Absolute 0.5 0.1 - 1.0 K/uL   Eosinophils Relative 3 %   Eosinophils Absolute 0.2 0.0 - 0.5 K/uL   Basophils Relative 1 %   Basophils Absolute 0.1 0.0 - 0.1 K/uL   Immature Granulocytes 0 %   Abs Immature Granulocytes 0.02 0.00 - 0.07 K/uL  CMP (Cancer Center only)  Result Value Ref Range   Sodium 140 135 - 145 mmol/L   Potassium 4.2 3.5 - 5.1 mmol/L   Chloride 107 98 - 111 mmol/L   CO2 26 22 - 32 mmol/L   Glucose, Bld 101 (H) 70 - 99 mg/dL   BUN 10 6 - 20 mg/dL   Creatinine 6.00 4.59 - 1.24 mg/dL   Calcium 9.4 8.9 - 97.7 mg/dL   Total Protein 7.0 6.5 - 8.1 g/dL   Albumin 4.3 3.5 - 5.0 g/dL   AST 23 15 - 41 U/L   ALT 20 0 - 44 U/L   Alkaline Phosphatase 61 38 - 126 U/L   Total Bilirubin 1.1 0.3 - 1.2 mg/dL   GFR, Estimated >41 >42 mL/min   Anion gap 7 5 - 15  Ferritin  Result Value Ref Range   Ferritin 38 24 - 336 ng/mL  Iron and Iron Binding Capacity ([facility] only)  Result Value Ref Range   Iron 97 45 - 182 ug/dL   TIBC 395 320 - 233 ug/dL   Saturation Ratios 35 17.9 - 39.5 %   UIBC 184 117 - 376 ug/dL     Assessment & Plan:   Problem List Items Addressed This Visit     Medicare annual wellness visit, subsequent - Primary (Chronic)    I have personally reviewed the Medicare Annual Wellness questionnaire and have noted 1. The patient's medical and social history 2. Their use of alcohol, tobacco or illicit drugs 3. Their current medications and supplements 4. The patient's functional ability including ADL's, fall risks, home safety risks and hearing or visual impairment. Cognitive function has been assessed and addressed as indicated.  5. Diet and physical activity 6. Evidence for depression or mood disorders The patients weight, height, BMI have been recorded in the chart. I have made referrals, counseling and provided education to the patient based on review of the above and I have provided the pt with a written personalized care plan for preventive services. Provider list updated.. See scanned questionairre as needed for further documentation. Reviewed preventative protocols and updated unless pt declined.       Advanced care planning/counseling discussion (Chronic)    Advanced directive - has not set up yet. Would want brother and 2 sons to be HCPOA. Packet provided today.  Health maintenance examination (Chronic)    Preventative protocols reviewed and updated unless pt declined. Discussed healthy diet and lifestyle.       Retinitis pigmentosa    Sees retinologist yearly.       Hereditary hemochromatosis (HCC)    Appreciate heme care.  No recent need for phlebotomy.       Dyslipidemia    Update FLP off medication. The ASCVD Risk score (Arnett DK, et al., 2019) failed to calculate for the following reasons:   The valid total cholesterol range is 130 to 320 mg/dL       Relevant Orders   Lipid panel   Subclinical hypothyroidism    Update levels off replacement.       Relevant Orders   TSH   T4, free   AK (actinic keratosis)    Refer to derm.        Relevant Orders   Ambulatory referral to Dermatology   Other Visit Diagnoses     Need for shingles vaccine       Relevant Orders   Varicella-zoster vaccine IM (Completed)        No orders of the defined types were placed in this encounter.  Orders Placed This Encounter  Procedures   Varicella-zoster vaccine IM   TSH   T4, free   Lipid panel   Ambulatory referral to Dermatology    Referral Priority:   Routine    Referral Type:   Consultation    Referral Reason:   Specialty Services Required    Requested Specialty:   Dermatology    Number of Visits Requested:   1    Patient instructions: Labs today.  First shingles shot today. Return in 2-6 months for 2nd and final shot.  Advanced directive packet provided today.   We will refer you to skin doctor for skin cancer screen and AKs (actinic keratoses).   You are doing well today Return as needed or in 1  year for next physical.   Follow up plan: Return in about 1 year (around 12/23/2022) for annual exam, prior fasting for blood work, medicare wellness visit.  Eustaquio Boyden, MD

## 2022-03-21 ENCOUNTER — Inpatient Hospital Stay: Payer: Medicare PPO | Attending: Hematology and Oncology

## 2022-03-21 LAB — CMP (CANCER CENTER ONLY)
ALT: 23 U/L (ref 0–44)
AST: 21 U/L (ref 15–41)
Albumin: 4.2 g/dL (ref 3.5–5.0)
Alkaline Phosphatase: 60 U/L (ref 38–126)
Anion gap: 3 — ABNORMAL LOW (ref 5–15)
BUN: 15 mg/dL (ref 6–20)
CO2: 27 mmol/L (ref 22–32)
Calcium: 8.9 mg/dL (ref 8.9–10.3)
Chloride: 108 mmol/L (ref 98–111)
Creatinine: 0.85 mg/dL (ref 0.61–1.24)
GFR, Estimated: >60 mL/min (ref >60)
Glucose, Bld: 100 mg/dL — ABNORMAL HIGH (ref 70–99)
Potassium: 4.2 mmol/L (ref 3.5–5.1)
Sodium: 138 mmol/L (ref 135–145)
Total Bilirubin: 1.2 mg/dL (ref 0.3–1.2)
Total Protein: 6.8 g/dL (ref 6.5–8.1)

## 2022-03-21 LAB — CBC WITH DIFFERENTIAL (CANCER CENTER ONLY)
Abs Immature Granulocytes: 0.02 10*3/uL (ref 0.00–0.07)
Basophils Absolute: 0 10*3/uL (ref 0.0–0.1)
Basophils Relative: 1 %
Eosinophils Absolute: 0.3 10*3/uL (ref 0.0–0.5)
Eosinophils Relative: 4 %
HCT: 44.9 % (ref 39.0–52.0)
Hemoglobin: 16.4 g/dL (ref 13.0–17.0)
Immature Granulocytes: 0 %
Lymphocytes Relative: 29 %
Lymphs Abs: 1.9 10*3/uL (ref 0.7–4.0)
MCH: 34.2 pg — ABNORMAL HIGH (ref 26.0–34.0)
MCHC: 36.5 g/dL — ABNORMAL HIGH (ref 30.0–36.0)
MCV: 93.7 fL (ref 80.0–100.0)
Monocytes Absolute: 0.5 10*3/uL (ref 0.1–1.0)
Monocytes Relative: 7 %
Neutro Abs: 4 10*3/uL (ref 1.7–7.7)
Neutrophils Relative %: 59 %
Platelet Count: 215 10*3/uL (ref 150–400)
RBC: 4.79 MIL/uL (ref 4.22–5.81)
RDW: 13.1 % (ref 11.5–15.5)
WBC Count: 6.7 10*3/uL (ref 4.0–10.5)
nRBC: 0 % (ref 0.0–0.2)

## 2022-03-21 LAB — FERRITIN: Ferritin: 36 ng/mL (ref 24–336)

## 2022-03-21 LAB — IRON AND IRON BINDING CAPACITY (CC-WL,HP ONLY)
Iron: 157 ug/dL (ref 45–182)
Saturation Ratios: 59 % — ABNORMAL HIGH (ref 17.9–39.5)
TIBC: 265 ug/dL (ref 250–450)
UIBC: 108 ug/dL — ABNORMAL LOW (ref 117–376)

## 2022-04-27 ENCOUNTER — Ambulatory Visit (INDEPENDENT_AMBULATORY_CARE_PROVIDER_SITE_OTHER): Payer: Medicare PPO | Admitting: *Deleted

## 2022-04-27 DIAGNOSIS — Z23 Encounter for immunization: Secondary | ICD-10-CM | POA: Diagnosis not present

## 2022-05-26 DIAGNOSIS — Z96641 Presence of right artificial hip joint: Secondary | ICD-10-CM | POA: Diagnosis not present

## 2022-05-26 DIAGNOSIS — M1612 Unilateral primary osteoarthritis, left hip: Secondary | ICD-10-CM | POA: Diagnosis not present

## 2022-06-20 ENCOUNTER — Inpatient Hospital Stay (HOSPITAL_BASED_OUTPATIENT_CLINIC_OR_DEPARTMENT_OTHER): Payer: Medicare PPO | Admitting: Hematology and Oncology

## 2022-06-20 ENCOUNTER — Inpatient Hospital Stay: Payer: Medicare PPO | Attending: Hematology and Oncology

## 2022-06-20 DIAGNOSIS — Z87891 Personal history of nicotine dependence: Secondary | ICD-10-CM | POA: Insufficient documentation

## 2022-06-20 LAB — CBC WITH DIFFERENTIAL (CANCER CENTER ONLY)
Abs Immature Granulocytes: 0.02 10*3/uL (ref 0.00–0.07)
Basophils Absolute: 0.1 10*3/uL (ref 0.0–0.1)
Basophils Relative: 1 %
Eosinophils Absolute: 0.3 10*3/uL (ref 0.0–0.5)
Eosinophils Relative: 4 %
HCT: 45.7 % (ref 39.0–52.0)
Hemoglobin: 16.7 g/dL (ref 13.0–17.0)
Immature Granulocytes: 0 %
Lymphocytes Relative: 32 %
Lymphs Abs: 2.4 10*3/uL (ref 0.7–4.0)
MCH: 34.9 pg — ABNORMAL HIGH (ref 26.0–34.0)
MCHC: 36.5 g/dL — ABNORMAL HIGH (ref 30.0–36.0)
MCV: 95.4 fL (ref 80.0–100.0)
Monocytes Absolute: 0.5 10*3/uL (ref 0.1–1.0)
Monocytes Relative: 6 %
Neutro Abs: 4.2 10*3/uL (ref 1.7–7.7)
Neutrophils Relative %: 57 %
Platelet Count: 209 10*3/uL (ref 150–400)
RBC: 4.79 MIL/uL (ref 4.22–5.81)
RDW: 12.7 % (ref 11.5–15.5)
WBC Count: 7.5 10*3/uL (ref 4.0–10.5)
nRBC: 0 % (ref 0.0–0.2)

## 2022-06-20 LAB — CMP (CANCER CENTER ONLY)
ALT: 23 U/L (ref 0–44)
AST: 19 U/L (ref 15–41)
Albumin: 4.2 g/dL (ref 3.5–5.0)
Alkaline Phosphatase: 62 U/L (ref 38–126)
Anion gap: 7 (ref 5–15)
BUN: 13 mg/dL (ref 6–20)
CO2: 25 mmol/L (ref 22–32)
Calcium: 9.1 mg/dL (ref 8.9–10.3)
Chloride: 108 mmol/L (ref 98–111)
Creatinine: 0.87 mg/dL (ref 0.61–1.24)
GFR, Estimated: >60 mL/min (ref >60)
Glucose, Bld: 111 mg/dL — ABNORMAL HIGH (ref 70–99)
Potassium: 4.1 mmol/L (ref 3.5–5.1)
Sodium: 140 mmol/L (ref 135–145)
Total Bilirubin: 1.7 mg/dL — ABNORMAL HIGH (ref 0.3–1.2)
Total Protein: 6.1 g/dL — ABNORMAL LOW (ref 6.5–8.1)

## 2022-06-20 LAB — IRON AND IRON BINDING CAPACITY (CC-WL,HP ONLY)
Iron: 237 ug/dL — ABNORMAL HIGH (ref 45–182)
Saturation Ratios: 97 % — ABNORMAL HIGH (ref 17.9–39.5)
TIBC: 245 ug/dL — ABNORMAL LOW (ref 250–450)
UIBC: 8 ug/dL — ABNORMAL LOW (ref 117–376)

## 2022-06-20 LAB — FERRITIN: Ferritin: 51 ng/mL (ref 24–336)

## 2022-06-20 NOTE — Progress Notes (Signed)
[facility] [phone]   [phone]  PROGRESS NOTE  Patient Care Team: Eustaquio Boyden, MD as PCP - General (Family Medicine) Sherrie George, MD as Consulting Physician (Ophthalmology) Meryl Dare, MD as Consulting Physician (Gastroenterology)  Hematological/Oncological History # Hereditary Hemochromatosis- Homozygous for C282Y  06/27/2013: HFE testing showed patient was homozygous for C282Y, ferritin >1500 06/19/13- 04/09/2018: underwent phlebotomies, Ferritin decreased to 41.7 on 05/09/2018 03/12/2021: Ferritin 17 06/21/2021: establish care with Dr [name]   Interval History:  Michael Vaughan 57 y.o. male with medical history significant for hereditary hemochromatosis who presents for a follow up visit. The patient's last visit was on 12/22/2021 at which time he established care. In the interim since the last visit he has not required any phlebotomies.  On exam today Michael Vaughan reports he has been well in the interim since her last visit.  He reports he has been doing fine.  He notes he is had no new medications or no new diagnoses.  He reports his energy levels are "normal".  He currently ranks them as about a 7 out of 10.  His appetite has been good.  He is not having any trouble with lightheadedness, dizziness, or shortness of breath.  He reports that since his first COVID shot he has been having some issues with shortness of breath as well as hypertension.  He notes he has not had any overt signs of bleeding such as nosebleeds, gum bleeding, or dark stools.  He has not required a phlebotomy in over 1 year.  He notes that his primary care provider is keeping an eye on his blood pressure.  He did drink some alcohol last night including a 1 rum and Dr. Reino Kent, some beers, and a shot of peach bourbon.  He reports he was celebrating because it was "Sunday".  Overall he feels well and has no questions concerns or complaints.  He denies any fevers, chills,  sweats, nausea, vomiting or diarrhea.  Full 10 point ROS is listed below.  MEDICAL HISTORY:  Past Medical History:  Diagnosis Date   Allergy    Arthralgia    takes aleve regularly   Arthritis    Cataract    Heartburn    Hereditary hemochromatosis (HCC) 05/2013   iron overload with mild HM, homozygous for C282Y, monthly blood draws   History of chicken pox    History of migraine 1990s   remote   Retinitis pigmentosa     SURGICAL HISTORY: Past Surgical History:  Procedure Laterality Date   CATARACT EXTRACTION BILATERAL W/ ANTERIOR VITRECTOMY Bilateral 2001   COLONOSCOPY  12/2016   WNL [name])   INGUINAL HERNIA REPAIR  1972   at age 39   IRIDECTOMY Right 01/14/2014   Procedure: IRIDECTOMY;  Surgeon: Sherrie George, MD;  Location: [facility] OR;  Service: Ophthalmology;  Laterality: Right;   PARS PLANA VITRECTOMY  01/24/2012   Procedure: PARS PLANA VITRECTOMY WITH 25G REMOVAL/SUTURE INTRAOCULAR LENS;  Surgeon: Sherrie George, MD;  Location: [facility] OR;  Service: Ophthalmology;  Laterality: Left;   PARS PLANA VITRECTOMY Right 01/14/2014   dislocated intraocular lens - PARS PLANA VITRECTOMY WITH 25G REMOVAL/SUTURE SECONDARY INTRAOCULAR LENS;  Sherrie George, MD   PHOTOCOAGULATION WITH LASER Right 01/14/2014   Procedure: PHOTOCOAGULATION WITH LASER;  Surgeon: Sherrie George, MD;  Location: [facility] OR;  Service: Ophthalmology;  Laterality: Right;   TOTAL HIP ARTHROPLASTY Right 04/27/2021   Procedure: TOTAL HIP ARTHROPLASTY ANTERIOR APPROACH;  Surgeon: Durene Romans, MD;  Location: [facility] ORS;  Service: Orthopedics;  Laterality: Right;    SOCIAL HISTORY: Social History   Socioeconomic History   Marital status: Single    Spouse name: Not on file   Number of children: 2   Years of education: Not on file   Highest education level: Not on file  Occupational History   Occupation: disabled  Tobacco Use   Smoking status: Former    Packs/day: 1.00    Types: Cigarettes    Quit date: 03/12/2004    Years  since quitting: 18.2   Smokeless tobacco: Current    Types: Snuff    Last attempt to quit: 10/12/2015   Tobacco comments:    quit in 2005, occasional smokeless tobacco - now using the [employer] tobacco free snuff  Vaping Use   Vaping Use: Never used  Substance and Sexual Activity   Alcohol use: Yes    Alcohol/week: 0.0 standard drinks of alcohol    Comment: rarely   Drug use: No   Sexual activity: Yes  Other Topics Concern   Not on file  Social History Narrative   Caffeine: 4-6 cups coffee   Lives with wife, 1 son, other grown children, dog and cows   Occupation: on disability for vision loss, legally blind, raises cows   Edu: HS   Activity: raises cows, some hunting/fishing   Diet: good water, daily fruits/vegetables   Social Determinants of Health   Financial Resource Strain: Not on file  Food Insecurity: Not on file  Transportation Needs: Not on file  Physical Activity: Not on file  Stress: Not on file  Social Connections: Not on file  Intimate Partner Violence: Not on file    FAMILY HISTORY: Family History  Problem Relation Age of Onset   Colon cancer Paternal Aunt 69   Leukemia Father        CLL spread to brain   Hypertension Father        and mother and family   Breast cancer Paternal Aunt    Prostate cancer Brother 48   Hyperlipidemia Other        father's side   CAD Paternal Uncle        MI, CABG   Stroke Maternal Grandmother    Diabetes Other        mother's side   Deep vein thrombosis Brother        with PE   Stroke Maternal Grandfather    Hypertension Mother    Esophageal cancer Neg Hx    Liver cancer Neg Hx    Pancreatic cancer Neg Hx    Rectal cancer Neg Hx    Stomach cancer Neg Hx     ALLERGIES:  is allergic to augmentin [amoxicillin-pot clavulanate].  MEDICATIONS:  Current Outpatient Medications  Medication Sig Dispense Refill   pilocarpine (PILOCAR) 1 % ophthalmic solution Place 1 drop into the left eye 2 (two) times daily.       No current facility-administered medications for this visit.    REVIEW OF SYSTEMS:   Constitutional: ( - ) fevers, ( - )  chills , ( - ) night sweats Eyes: ( - ) blurriness of vision, ( - ) double vision, ( - ) watery eyes Ears, nose, mouth, throat, and face: ( - ) mucositis, ( - ) sore throat Respiratory: ( - ) cough, ( - ) dyspnea, ( - ) wheezes Cardiovascular: ( - ) palpitation, ( - ) chest discomfort, ( - ) lower extremity swelling Gastrointestinal:  ( - )  nausea, ( - ) heartburn, ( - ) change in bowel habits Skin: ( - ) abnormal skin rashes Lymphatics: ( - ) new lymphadenopathy, ( - ) easy bruising Neurological: ( - ) numbness, ( - ) tingling, ( - ) new weaknesses Behavioral/Psych: ( - ) mood change, ( - ) new changes  All other systems were reviewed with the patient and are negative.  PHYSICAL EXAMINATION:  Vitals:   06/20/22 1031  BP: (!) 159/93  Pulse: 65  Resp: 14  Temp: 98.1 F (36.7 C)  SpO2: 97%   Filed Weights   06/20/22 1031  Weight: 204 lb 6.4 oz (92.7 kg)    GENERAL: Well-appearing middle-age Caucasian male, alert, no distress and comfortable SKIN: skin color, texture, turgor are normal, no rashes or significant lesions EYES: conjunctiva are pink and non-injected, sclera clear LUNGS: clear to auscultation and percussion with normal breathing effort HEART: regular rate & rhythm and no murmurs and no lower extremity edema Musculoskeletal: no cyanosis of digits and no clubbing  PSYCH: alert & oriented x 3, fluent speech NEURO: no focal motor/sensory deficits  LABORATORY DATA:  I have reviewed the data as listed    Latest Ref Rng & Units 06/20/2022    9:50 AM 03/21/2022   11:10 AM 12/20/2021   10:45 AM  CBC  WBC 4.0 - 10.5 K/uL 7.5  6.7  6.1   Hemoglobin 13.0 - 17.0 g/dL 93.8  10.1  75.1   Hematocrit 39.0 - 52.0 % 45.7  44.9  44.6   Platelets 150 - 400 K/uL 209  215  232        Latest Ref Rng & Units 06/20/2022    9:50 AM 03/21/2022   11:10 AM  12/20/2021   10:45 AM  CMP  Glucose 70 - 99 mg/dL 025  852  778   BUN 6 - 20 mg/dL 13  15  10    Creatinine 0.61 - 1.24 mg/dL  2.42  3.53   Sodium 135 - 145 mmol/L 140  138  140   Potassium 3.5 - 5.1 mmol/L 4.1  4.2  4.2   Chloride 98 - 111 mmol/L 108  108  107   CO2 22 - 32 mmol/L 25  27  26    Calcium 8.9 - 10.3 mg/dL 9.1  8.9  9.4   Total Protein 6.5 - 8.1 g/dL 6.1  6.8  7.0   Total Bilirubin 0.3 - 1.2 mg/dL 1.7  1.2  1.1   Alkaline Phos 38 - 126 U/L 62  60  61   AST 15 - 41 U/L 19  21  23    ALT 0 - 44 U/L 23  23  20     RADIOGRAPHIC STUDIES: No results found.  ASSESSMENT & PLAN Michael Vaughan 57 y.o. male with medical history significant for hereditary hemochromatosis who presents for a follow up visit.   After review of the labs, review of the records, and discussion with the patient the patients findings are most consistent with hereditary hemochromatosis from a homozygous C282Y mutation.   Elevated serum ferritin levels have numerous possible etiologies. These include hereditary hemochromatosis (heterozygous or homozygous), inflammation, liver disease, or iron overload from an exogenous source. Hereditary hemochromatosis is a hereditary condition caused by mutations in the HFE gene, which regulates iron absorption. The most common genes mutated in this condition are the C282Y and H63D genes. Homozygous mutations represent a disease state which requires phlebotomy to decrease ferritin levels to a goal of <50  (  Blood (2010) 116 (3): 317-325). The goal is to decrease ferritin so there is no deposition in critical organs (liver, heart, pancreas and thyroid). Heterozygous mutations (or compound heterozygotes) rarely require phlebotomy, but do have elevated serum iron/ferritin levels.  Ferritin is an acute phase reactant and can be elevated with systemic inflammation. Direct damage to liver tissue can also cause spillage of ferritin into the blood, resulting in elevated ferritin.   Additionally, serum iron levels can be quite transient and an elevation or serum iron may not represent a true overload of total body iron (best lab for this is ferritin).     #Homozygous Hereditary Hemochromatosis  --labs to include CBC, CMP --patient has prior testing for TSH/T4 and Hgb A1c --will repeat iron panel and ferritin today --prior positive testing for HFE gene mutation. Found to have homozygous mutation for C282Y. -- no inidication for phlebotomy at this time. We will begin phlebotomies every other week if ferritin increases above 150.  --Labs today show white blood cell count 7.5, hemoglobin 16.7, MCV 95.4, and platelets of 209.  Ferritin was 51, no need for phlebotomy at this time. --RTC in 6 months with interval 3 month labs.     No orders of the defined types were placed in this encounter.   All questions were answered. The patient knows to call the clinic with any problems, questions or concerns.  A total of more than 30 minutes were spent on this encounter with face-to-face time and non-face-to-face time, including preparing to see the patient, ordering tests and/or medications, counseling the patient and coordination of care as outlined above.   Ulysees Barns, MD Department of Hematology/Oncology [facility] Phone: [phone] Pager: [phone] Email: [email or web address]  06/20/2022 10:39 AM

## 2022-06-22 ENCOUNTER — Telehealth: Payer: Self-pay

## 2022-06-22 NOTE — Telephone Encounter (Addendum)
Spoke with patient to relay message below. No questions voiced.  ----- Message from Orson Slick, MD sent at 06/22/2022  9:22 AM EST ----- Please let Michael Vaughan know that his ferritin is currently 51. We do not need to restart phlebotomy until it is >150. We will check his labs in April and see him back in July 2024.   ----- Message ----- From: Buel Ream, Lab In [place] Sent: 06/20/2022  10:02 AM EST To: Orson Slick, MD

## 2022-07-22 ENCOUNTER — Encounter (INDEPENDENT_AMBULATORY_CARE_PROVIDER_SITE_OTHER): Payer: Medicare PPO | Admitting: Ophthalmology

## 2022-07-24 IMAGING — DX DG PORTABLE PELVIS
2 series · 2 of 2 positions shown · non-contrast
Comparison: Right hip x-rays from same day.

CLINICAL DATA: Right hip replacement.

EXAM:
PORTABLE PELVIS 1-2 VIEWS

[pelvis ap]
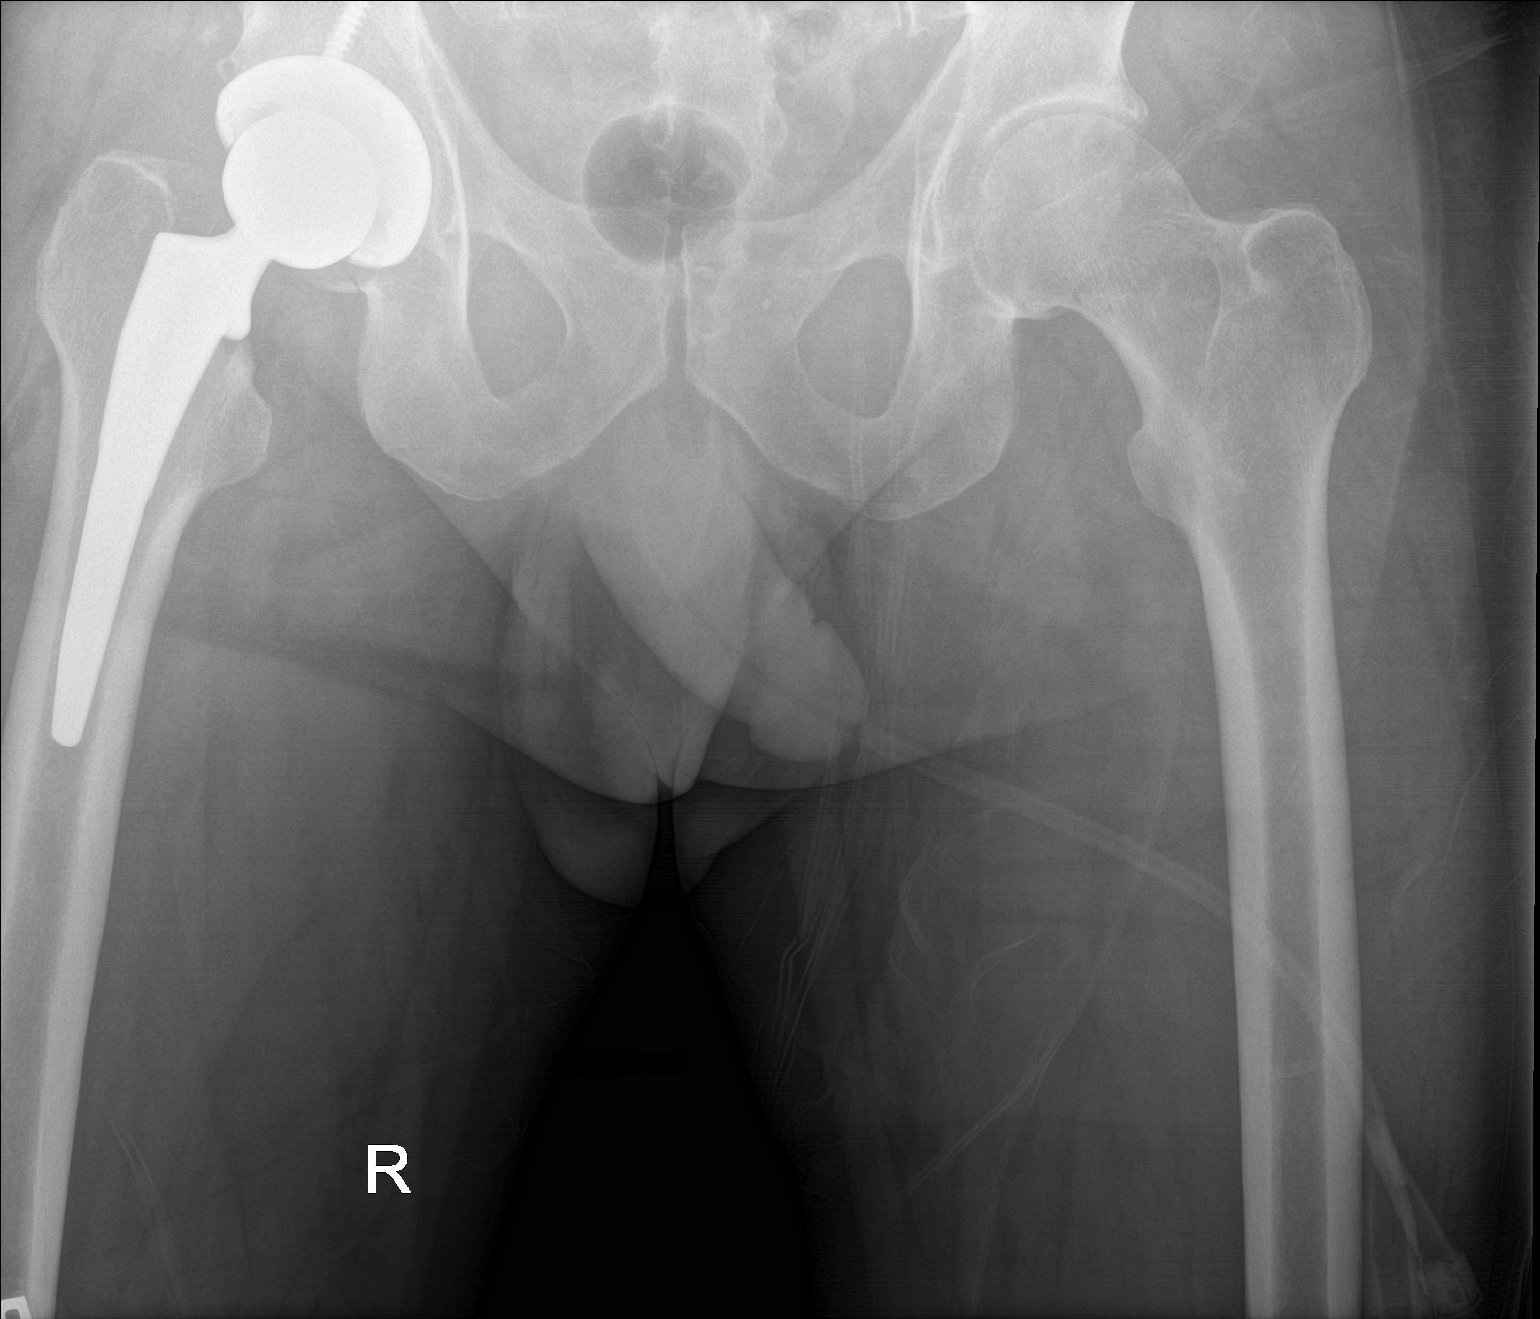

[pelvis lat]
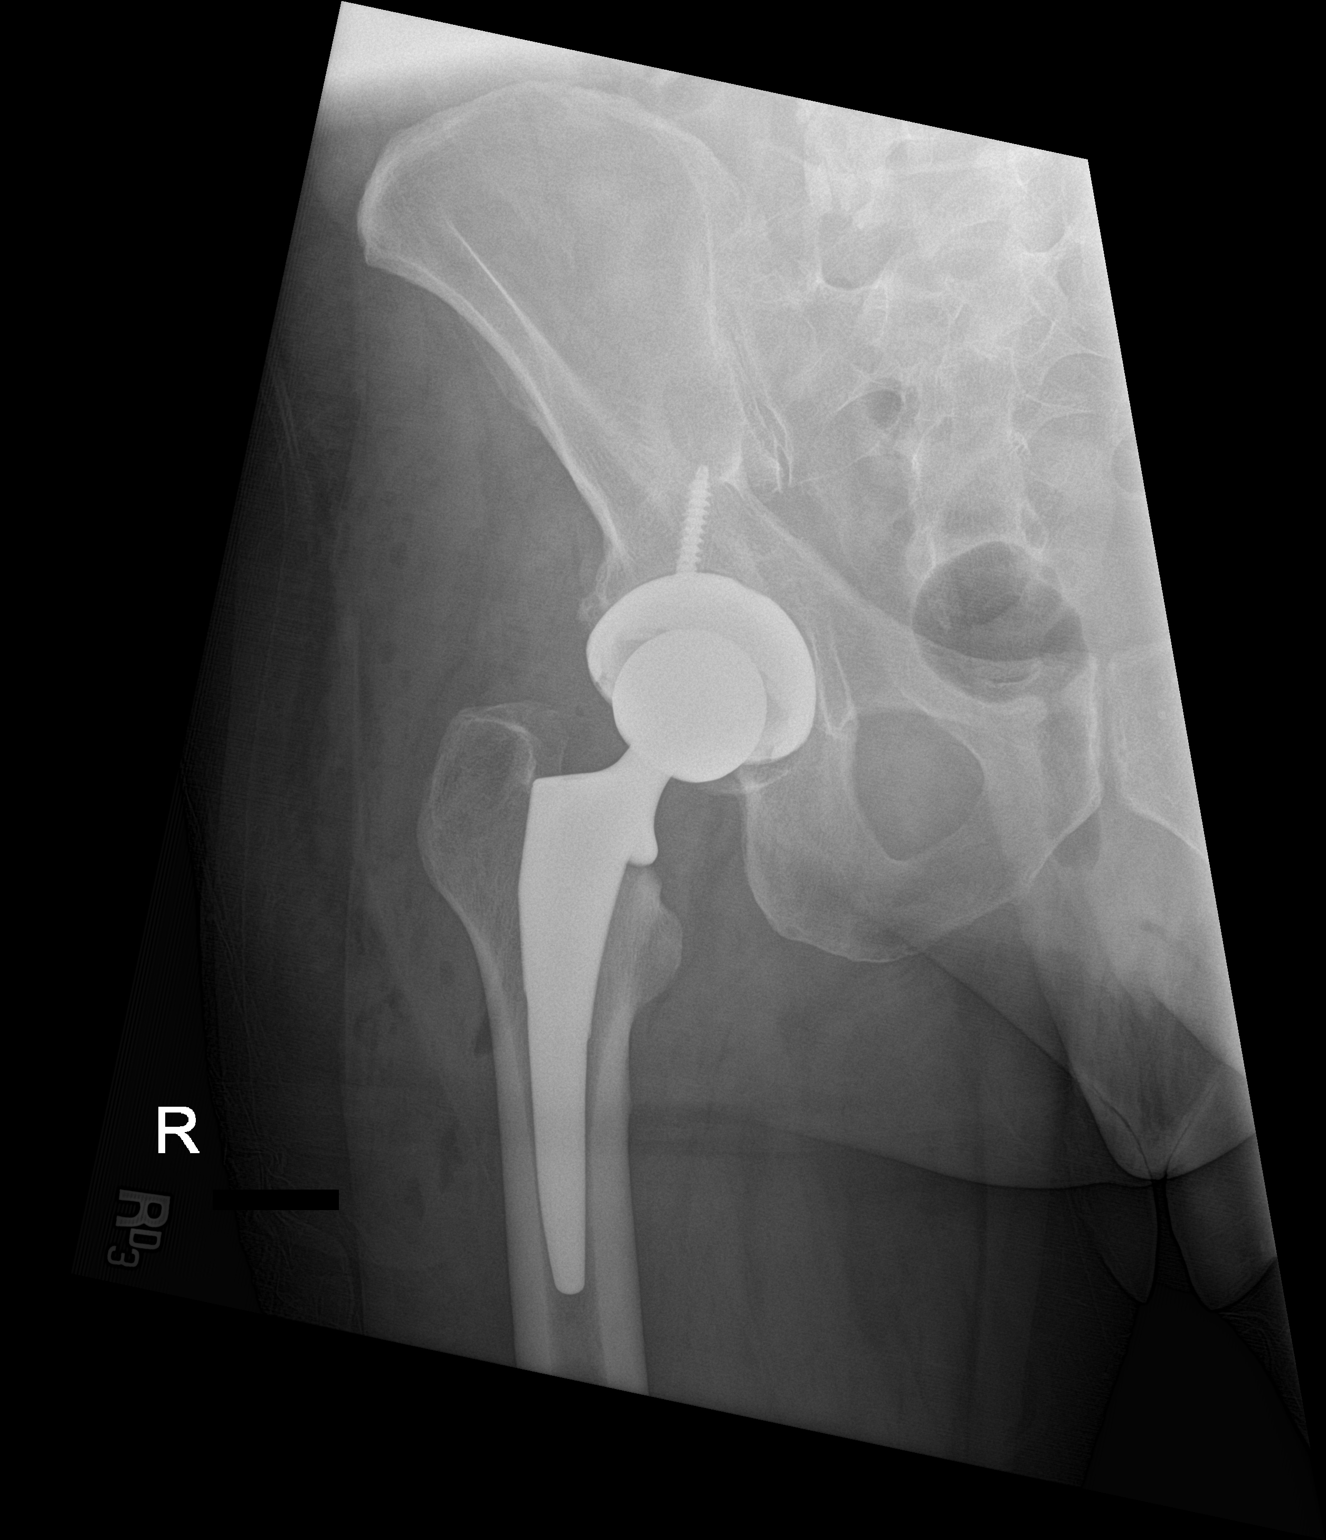

[2 of 2 positions shown; findings below may reference images not displayed]

FINDINGS: The right hip demonstrates a total arthroplasty without evidence of
hardware failure or complication. Small linear lucency through the
inferior right acetabulum. No dislocation. The alignment is
anatomic. Post-surgical changes and subcutaneous emphysema noted in
the surrounding soft tissues. Mild left hip osteoarthritis.
IMPRESSION: 1. Right total hip arthroplasty. Small linear lucency through the
inferior right acetabulum, concerning for nondisplaced fracture.

## 2022-07-24 IMAGING — RF DG HIP (WITH PELVIS) OPERATIVE*R*
1 series · 4 of 4 positions shown · non-contrast
Comparison: None.

CLINICAL DATA: Right hip replacement

EXAM:
OPERATIVE right HIP (WITH PELVIS IF PERFORMED) 4 VIEWS
TECHNIQUE: Fluoroscopic spot image(s) were submitted for interpretation
post-operatively.

[Series 1: unknown protocol · 0.20mm/px · 4 of 4 slices shown]
[im 1/4]
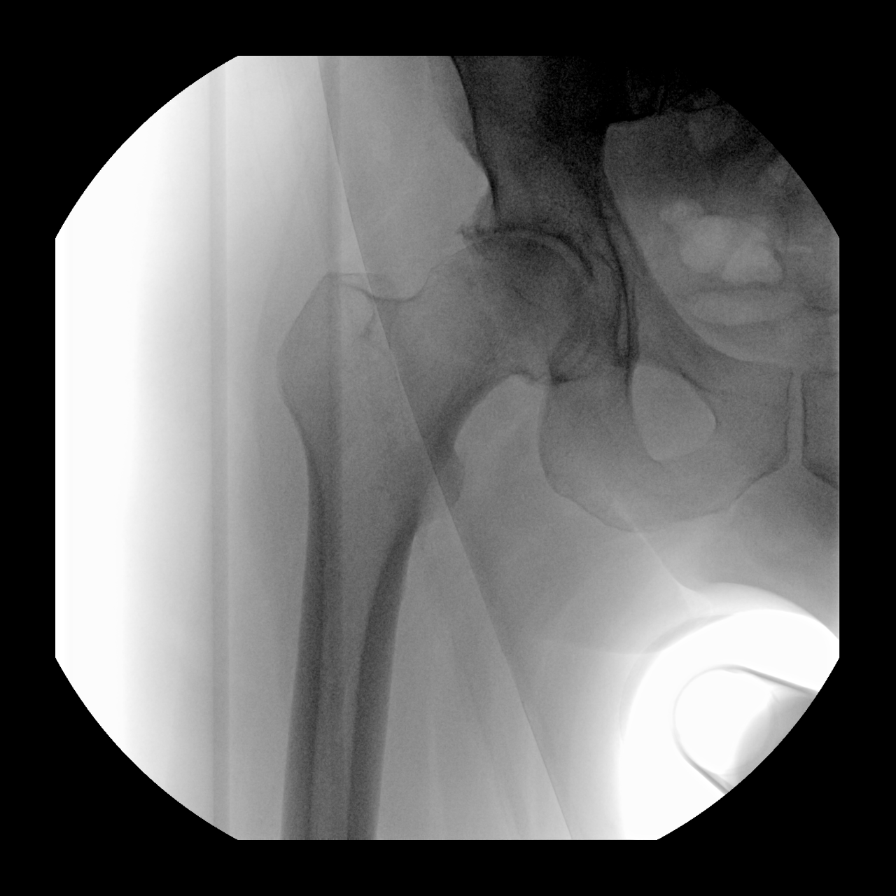
[im 2/4]
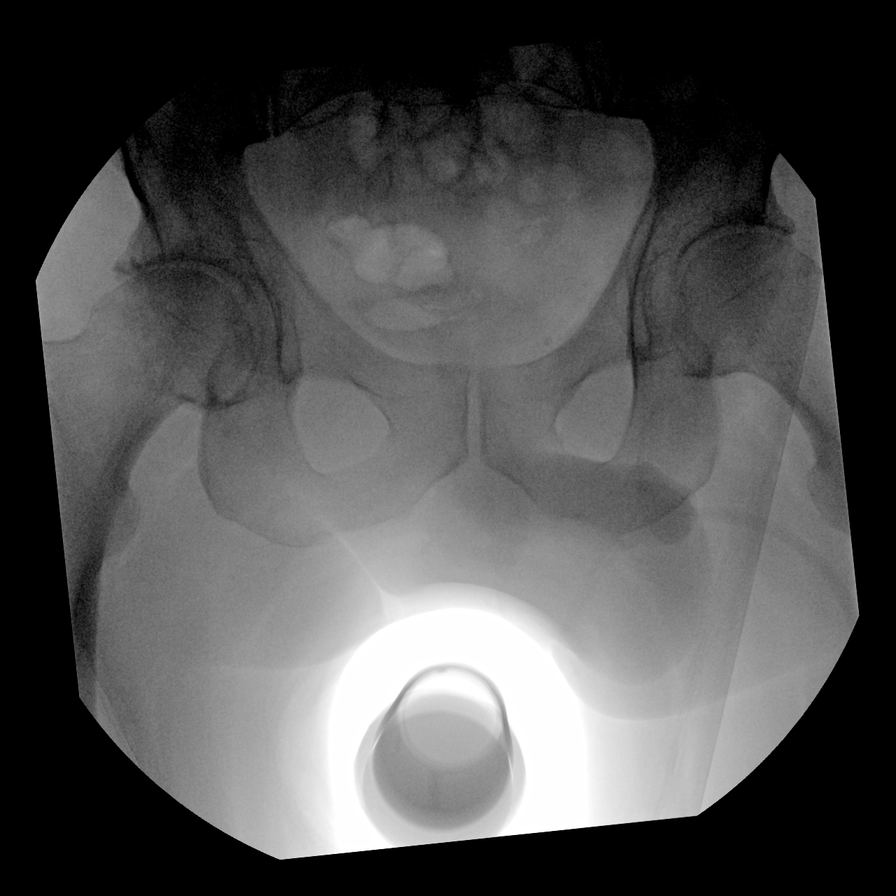
[im 3/4]
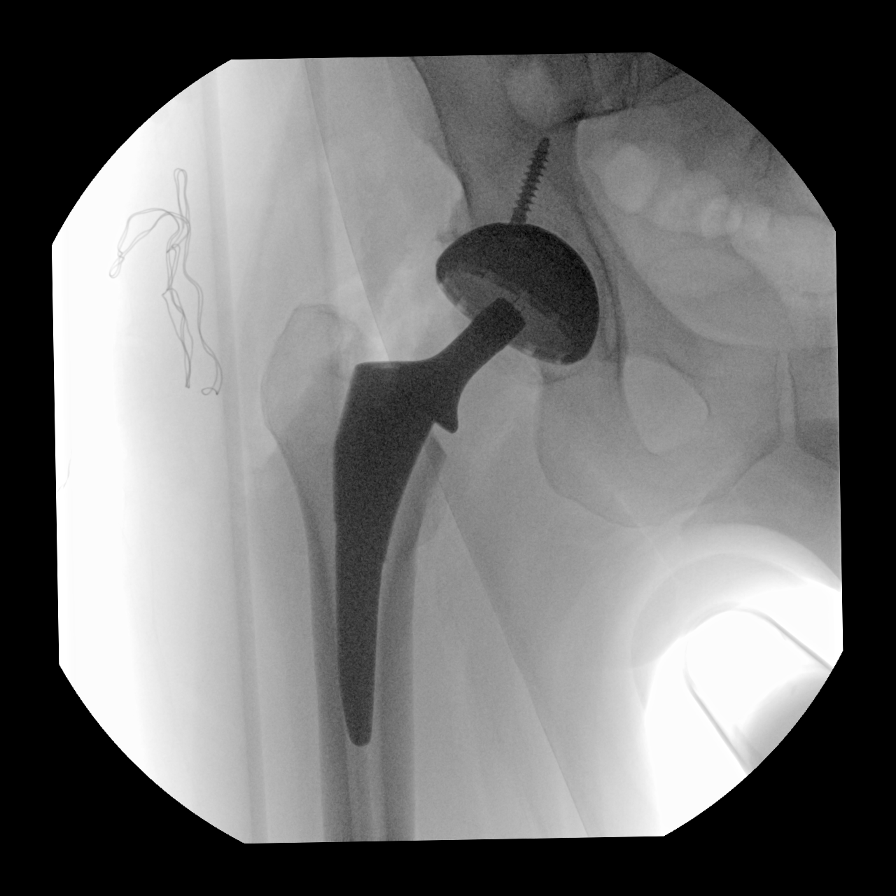
[im 4/4]
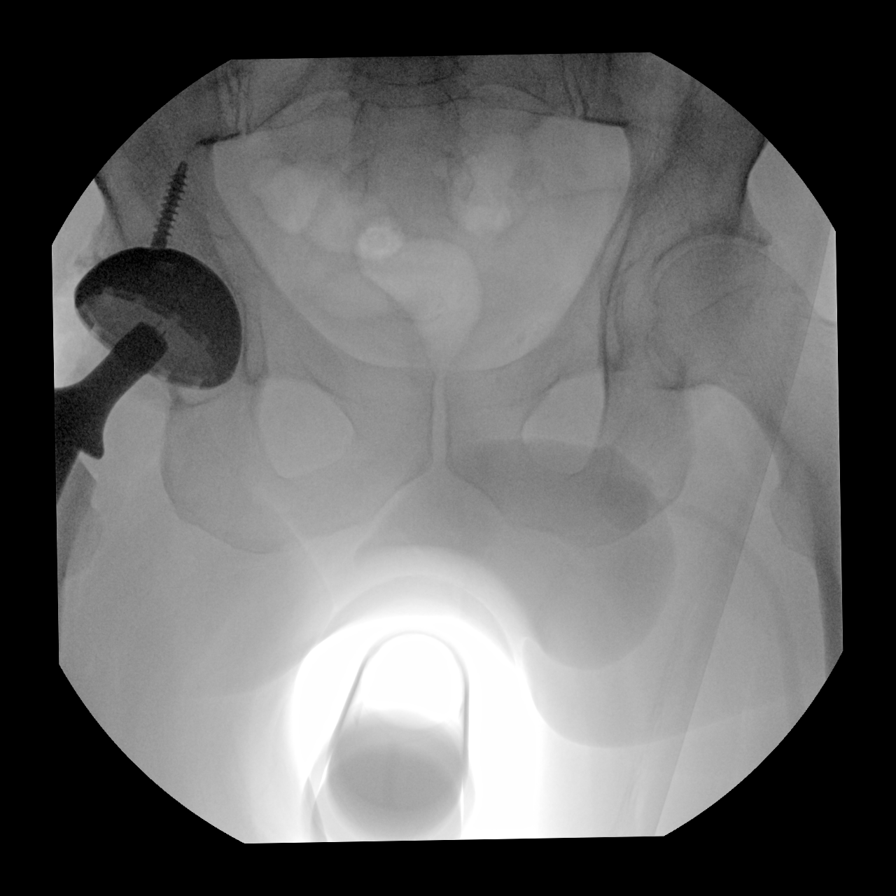

[4 of 4 positions shown; findings below may reference images not displayed]

FINDINGS: Four C-arm fluoroscopic images were obtained intraoperatively and
submitted for post operative interpretation. The initial images
demonstrate moderate to severe degenerative changes about the right
hip. Subsequent images demonstrate right hip arthroplasty hardware
which is within expected limits, without evidence of hardware
related complication. Mild degenerative changes about the left hip
are also noted. Fluoro time 7 seconds. Please see the performing
provider's procedural report for further detail.
IMPRESSION: Status post right hip arthroplasty without evidence of complication.

## 2022-07-25 ENCOUNTER — Encounter (INDEPENDENT_AMBULATORY_CARE_PROVIDER_SITE_OTHER): Payer: Medicare PPO | Admitting: Ophthalmology

## 2022-07-25 DIAGNOSIS — H35371 Puckering of macula, right eye: Secondary | ICD-10-CM | POA: Diagnosis not present

## 2022-07-25 DIAGNOSIS — H43811 Vitreous degeneration, right eye: Secondary | ICD-10-CM

## 2022-07-25 DIAGNOSIS — H3552 Pigmentary retinal dystrophy: Secondary | ICD-10-CM

## 2022-09-19 ENCOUNTER — Telehealth: Payer: Self-pay | Admitting: Hematology and Oncology

## 2022-09-19 NOTE — Telephone Encounter (Signed)
Contacted patient to scheduled appointments. Patient is aware of appointments that are scheduled.   

## 2022-09-20 ENCOUNTER — Inpatient Hospital Stay: Payer: Medicare PPO

## 2022-09-22 ENCOUNTER — Inpatient Hospital Stay: Payer: Medicare PPO | Attending: Hematology and Oncology

## 2022-09-22 ENCOUNTER — Other Ambulatory Visit: Payer: Self-pay | Admitting: Hematology and Oncology

## 2022-09-22 LAB — CBC WITH DIFFERENTIAL (CANCER CENTER ONLY)
Abs Immature Granulocytes: 0.03 10*3/uL (ref 0.00–0.07)
Basophils Absolute: 0.1 10*3/uL (ref 0.0–0.1)
Basophils Relative: 1 %
Eosinophils Absolute: 0.3 10*3/uL (ref 0.0–0.5)
Eosinophils Relative: 4 %
HCT: 44.9 % (ref 39.0–52.0)
Hemoglobin: 16.4 g/dL (ref 13.0–17.0)
Immature Granulocytes: 0 %
Lymphocytes Relative: 32 %
Lymphs Abs: 2.3 10*3/uL (ref 0.7–4.0)
MCH: 35.1 pg — ABNORMAL HIGH (ref 26.0–34.0)
MCHC: 36.5 g/dL — ABNORMAL HIGH (ref 30.0–36.0)
MCV: 96.1 fL (ref 80.0–100.0)
Monocytes Absolute: 0.4 10*3/uL (ref 0.1–1.0)
Monocytes Relative: 6 %
Neutro Abs: 4.2 10*3/uL (ref 1.7–7.7)
Neutrophils Relative %: 57 %
Platelet Count: 222 10*3/uL (ref 150–400)
RBC: 4.67 MIL/uL (ref 4.22–5.81)
RDW: 12.4 % (ref 11.5–15.5)
WBC Count: 7.3 10*3/uL (ref 4.0–10.5)
nRBC: 0 % (ref 0.0–0.2)

## 2022-09-22 LAB — CMP (CANCER CENTER ONLY)
ALT: 24 U/L (ref 0–44)
AST: 20 U/L (ref 15–41)
Albumin: 4.3 g/dL (ref 3.5–5.0)
Alkaline Phosphatase: 64 U/L (ref 38–126)
Anion gap: 6 (ref 5–15)
BUN: 13 mg/dL (ref 6–20)
CO2: 26 mmol/L (ref 22–32)
Calcium: 9.3 mg/dL (ref 8.9–10.3)
Chloride: 106 mmol/L (ref 98–111)
Creatinine: 0.88 mg/dL (ref 0.61–1.24)
GFR, Estimated: >60 mL/min (ref >60)
Glucose, Bld: 102 mg/dL — ABNORMAL HIGH (ref 70–99)
Potassium: 4.2 mmol/L (ref 3.5–5.1)
Sodium: 138 mmol/L (ref 135–145)
Total Bilirubin: 1.7 mg/dL — ABNORMAL HIGH (ref 0.3–1.2)
Total Protein: 6.8 g/dL (ref 6.5–8.1)

## 2022-09-22 LAB — RETIC PANEL
Immature Retic Fract: 11 % (ref 2.3–15.9)
RBC.: 4.6 MIL/uL (ref 4.22–5.81)
Retic Count, Absolute: 80 10*3/uL (ref 19.0–186.0)
Retic Ct Pct: 1.7 % (ref 0.4–3.1)
Reticulocyte Hemoglobin: 38.2 pg (ref >27.9)

## 2022-09-22 LAB — IRON AND IRON BINDING CAPACITY (CC-WL,HP ONLY)
Iron: 248 ug/dL — ABNORMAL HIGH (ref 45–182)
Saturation Ratios: 99 % — ABNORMAL HIGH (ref 17.9–39.5)
TIBC: 251 ug/dL (ref 250–450)
UIBC: 3 ug/dL — ABNORMAL LOW (ref 117–376)

## 2022-09-22 LAB — FERRITIN: Ferritin: 91 ng/mL (ref 24–336)

## 2022-09-28 ENCOUNTER — Telehealth: Payer: Self-pay | Admitting: *Deleted

## 2022-09-28 NOTE — Telephone Encounter (Signed)
PC to patient, no answer, left VM - informed him his ferritin is 91, per Dr [name] is on target & will continue with plan to follow up as scheduled in July.  Instructed patient to call this office with any questions/concerns, [phone].

## 2022-09-28 NOTE — Telephone Encounter (Signed)
-----   Message from Kyra Searles, RN sent at 09/28/2022 10:47 AM EDT -----  ----- Message ----- From: Jaci Standard, MD Sent: 09/28/2022   8:01 AM EDT To: Kyra Searles, RN  Please let Mr. Ospina know that his ferritin is currently on target (<150). We will plan to see him back as scheduled in July 2024 ----- Message ----- From: Leory Plowman, [facility] [place] Sent: 09/22/2022  11:53 AM EDT To: Jaci Standard, MD

## 2022-12-18 ENCOUNTER — Other Ambulatory Visit: Payer: Self-pay | Admitting: Family Medicine

## 2022-12-18 DIAGNOSIS — E038 Other specified hypothyroidism: Secondary | ICD-10-CM

## 2022-12-18 DIAGNOSIS — E785 Hyperlipidemia, unspecified: Secondary | ICD-10-CM

## 2022-12-18 DIAGNOSIS — Z125 Encounter for screening for malignant neoplasm of prostate: Secondary | ICD-10-CM

## 2022-12-19 ENCOUNTER — Other Ambulatory Visit (INDEPENDENT_AMBULATORY_CARE_PROVIDER_SITE_OTHER): Payer: Medicare PPO

## 2022-12-19 DIAGNOSIS — Z125 Encounter for screening for malignant neoplasm of prostate: Secondary | ICD-10-CM | POA: Diagnosis not present

## 2022-12-19 DIAGNOSIS — E785 Hyperlipidemia, unspecified: Secondary | ICD-10-CM

## 2022-12-19 DIAGNOSIS — E038 Other specified hypothyroidism: Secondary | ICD-10-CM

## 2022-12-19 LAB — LIPID PANEL
Cholesterol: 114 mg/dL (ref 0–200)
HDL: 54 mg/dL (ref >39.00)
LDL Cholesterol: 45 mg/dL (ref 0–99)
NonHDL: 59.7
Total CHOL/HDL Ratio: 2
Triglycerides: 73 mg/dL (ref 0.0–149.0)
VLDL: 14.6 mg/dL (ref 0.0–40.0)

## 2022-12-19 LAB — COMPREHENSIVE METABOLIC PANEL
ALT: 24 U/L (ref 0–53)
AST: 22 U/L (ref 0–37)
Albumin: 4 g/dL (ref 3.5–5.2)
Alkaline Phosphatase: 59 U/L (ref 39–117)
BUN: 12 mg/dL (ref 6–23)
CO2: 28 mEq/L (ref 19–32)
Calcium: 9 mg/dL (ref 8.4–10.5)
Chloride: 103 mEq/L (ref 96–112)
Creatinine, Ser: 0.86 mg/dL (ref 0.40–1.50)
GFR: 96.01 mL/min (ref >60.00)
Glucose, Bld: 106 mg/dL — ABNORMAL HIGH (ref 70–99)
Potassium: 4.2 mEq/L (ref 3.5–5.1)
Sodium: 140 mEq/L (ref 135–145)
Total Bilirubin: 1.9 mg/dL — ABNORMAL HIGH (ref 0.2–1.2)
Total Protein: 6.1 g/dL (ref 6.0–8.3)

## 2022-12-19 LAB — TSH: TSH: 6 u[IU]/mL — ABNORMAL HIGH (ref 0.35–5.50)

## 2022-12-19 LAB — PSA: PSA: 0.25 ng/mL (ref 0.10–4.00)

## 2022-12-19 LAB — T4, FREE: Free T4: 0.73 ng/dL (ref 0.60–1.60)

## 2022-12-20 ENCOUNTER — Other Ambulatory Visit: Payer: Self-pay | Admitting: Hematology and Oncology

## 2022-12-20 NOTE — Progress Notes (Unsigned)
[facility] [phone]   [phone]  PROGRESS NOTE  Patient Care Team: Eustaquio Boyden, MD as PCP - General (Family Medicine) Sherrie George, MD as Consulting Physician (Ophthalmology) Meryl Dare, MD as Consulting Physician (Gastroenterology)  Hematological/Oncological History # Hereditary Hemochromatosis- Homozygous for C282Y  06/27/2013: HFE testing showed patient was homozygous for C282Y, ferritin >1500 06/19/13- 04/09/2018: underwent phlebotomies, Ferritin decreased to 41.7 on 05/09/2018 03/12/2021: Ferritin 17 06/21/2021: establish care with Dr [name]   Interval History:  Michael Vaughan 57 y.o. male with medical history significant for hereditary hemochromatosis who presents for a follow up visit. The patient's last visit was on 06/20/2022. In the interim since the last visit he has not required any phlebotomies.  On exam today [name] reports he has been well overall in the interim since her last visit.  He had a good 4 July and he "took it easy".  He has had no major changes in his health since we last saw him.  He is on no new medications and had no hospital emergency room visits.  He reports no runny nose, sore throat, or cough.  He notes his appetite is good and his weight has been stable.  He notes everything is been "status quo lately".  He notes he is not having any nausea, vomiting, or abdominal pain.  He reports he is not having any overt signs of bleeding, bruising, or dark stools.  He notes he has not been as good about avoiding red meat lately, but is doing his best to try to convert back to eating more chicken.  Overall he feels well and has no questions concerns or complaints.  He denies any fevers, chills, sweats, nausea, vomiting or diarrhea.  Full 10 point ROS is listed below.  MEDICAL HISTORY:  Past Medical History:  Diagnosis Date   Allergy    Arthralgia    takes aleve regularly   Arthritis    Cataract    Heartburn     Hereditary hemochromatosis (HCC) 05/2013   iron overload with mild HM, homozygous for C282Y, monthly blood draws   History of chicken pox    History of migraine 1990s   remote   Retinitis pigmentosa     SURGICAL HISTORY: Past Surgical History:  Procedure Laterality Date   CATARACT EXTRACTION BILATERAL W/ ANTERIOR VITRECTOMY Bilateral 2001   COLONOSCOPY  12/2016   WNL [name])   INGUINAL HERNIA REPAIR  1972   at age 9   IRIDECTOMY Right 01/14/2014   Procedure: IRIDECTOMY;  Surgeon: Sherrie George, MD;  Location: [facility] OR;  Service: Ophthalmology;  Laterality: Right;   PARS PLANA VITRECTOMY  01/24/2012   Procedure: PARS PLANA VITRECTOMY WITH 25G REMOVAL/SUTURE INTRAOCULAR LENS;  Surgeon: Sherrie George, MD;  Location: [facility] OR;  Service: Ophthalmology;  Laterality: Left;   PARS PLANA VITRECTOMY Right 01/14/2014   dislocated intraocular lens - PARS PLANA VITRECTOMY WITH 25G REMOVAL/SUTURE SECONDARY INTRAOCULAR LENS;  Sherrie George, MD   PHOTOCOAGULATION WITH LASER Right 01/14/2014   Procedure: PHOTOCOAGULATION WITH LASER;  Surgeon: Sherrie George, MD;  Location: [facility] OR;  Service: Ophthalmology;  Laterality: Right;   TOTAL HIP ARTHROPLASTY Right 04/27/2021   Procedure: TOTAL HIP ARTHROPLASTY ANTERIOR APPROACH;  Surgeon: Durene Romans, MD;  Location: [facility] ORS;  Service: Orthopedics;  Laterality: Right;    SOCIAL HISTORY: Social History   Socioeconomic History   Marital status: Single    Spouse name: Not on file   Number of children: 2  Years of education: Not on file   Highest education level: Not on file  Occupational History   Occupation: disabled  Tobacco Use   Smoking status: Former    Packs/day: 1    Types: Cigarettes    Quit date: 03/12/2004    Years since quitting: 18.7   Smokeless tobacco: Current    Types: Snuff    Last attempt to quit: 10/12/2015   Tobacco comments:    quit in 2005, occasional smokeless tobacco - now using the [employer] tobacco free snuff  Vaping Use    Vaping Use: Never used  Substance and Sexual Activity   Alcohol use: Yes    Alcohol/week: 0.0 standard drinks of alcohol    Comment: rarely   Drug use: No   Sexual activity: Yes  Other Topics Concern   Not on file  Social History Narrative   Caffeine: 4-6 cups coffee   Lives with wife, 1 son, other grown children, dog and cows   Occupation: on disability for vision loss, legally blind, raises cows   Edu: HS   Activity: raises cows, some hunting/fishing   Diet: good water, daily fruits/vegetables   Social Determinants of Health   Financial Resource Strain: Not on file  Food Insecurity: Not on file  Transportation Needs: Not on file  Physical Activity: Not on file  Stress: Not on file  Social Connections: Not on file  Intimate Partner Violence: Not on file    FAMILY HISTORY: Family History  Problem Relation Age of Onset   Colon cancer Paternal Aunt 23   Leukemia Father        CLL spread to brain   Hypertension Father        and mother and family   Breast cancer Paternal Aunt    Prostate cancer Brother 56   Hyperlipidemia Other        father's side   CAD Paternal Uncle        MI, CABG   Stroke Maternal Grandmother    Diabetes Other        mother's side   Deep vein thrombosis Brother        with PE   Stroke Maternal Grandfather    Hypertension Mother    Esophageal cancer Neg Hx    Liver cancer Neg Hx    Pancreatic cancer Neg Hx    Rectal cancer Neg Hx    Stomach cancer Neg Hx     ALLERGIES:  is allergic to augmentin [amoxicillin-pot clavulanate].  MEDICATIONS:  Current Outpatient Medications  Medication Sig Dispense Refill   pilocarpine (PILOCAR) 1 % ophthalmic solution Place 1 drop into the left eye 2 (two) times daily.      No current facility-administered medications for this visit.    REVIEW OF SYSTEMS:   Constitutional: ( - ) fevers, ( - )  chills , ( - ) night sweats Eyes: ( - ) blurriness of vision, ( - ) double vision, ( - ) watery eyes Ears,  nose, mouth, throat, and face: ( - ) mucositis, ( - ) sore throat Respiratory: ( - ) cough, ( - ) dyspnea, ( - ) wheezes Cardiovascular: ( - ) palpitation, ( - ) chest discomfort, ( - ) lower extremity swelling Gastrointestinal:  ( - ) nausea, ( - ) heartburn, ( - ) change in bowel habits Skin: ( - ) abnormal skin rashes Lymphatics: ( - ) new lymphadenopathy, ( - ) easy bruising Neurological: ( - ) numbness, ( - )  tingling, ( - ) new weaknesses Behavioral/Psych: ( - ) mood change, ( - ) new changes  All other systems were reviewed with the patient and are negative.  PHYSICAL EXAMINATION:  Vitals:   12/21/22 1029  BP: (!) 174/96  Pulse: 64  Resp: 15  Temp: 98.1 F (36.7 C)  SpO2: 99%    Filed Weights   12/21/22 1029  Weight: 201 lb 6.4 oz (91.4 kg)     GENERAL: Well-appearing middle-age Caucasian male, alert, no distress and comfortable SKIN: skin color, texture, turgor are normal, no rashes or significant lesions EYES: conjunctiva are pink and non-injected, sclera clear LUNGS: clear to auscultation and percussion with normal breathing effort HEART: regular rate & rhythm and no murmurs and no lower extremity edema Musculoskeletal: no cyanosis of digits and no clubbing  PSYCH: alert & oriented x 3, fluent speech NEURO: no focal motor/sensory deficits  LABORATORY DATA:  I have reviewed the data as listed    Latest Ref Rng & Units 12/21/2022    9:36 AM 09/22/2022   11:36 AM 06/20/2022    9:50 AM  CBC  WBC 4.0 - 10.5 K/uL 7.4  7.3  7.5   Hemoglobin 13.0 - 17.0 g/dL 16.1  09.6  04.5   Hematocrit 39.0 - 52.0 % 42.9  44.9  45.7   Platelets 150 - 400 K/uL 211  222  209        Latest Ref Rng & Units 12/21/2022    9:36 AM 12/19/2022    8:30 AM 09/22/2022   11:36 AM  CMP  Glucose 70 - 99 mg/dL 409  811  914   BUN 6 - 20 mg/dL 14  12  13    Creatinine 0.61 - 1.24 mg/dL 7.82  9.56  2.13   Sodium 135 - 145 mmol/L 139  140  138   Potassium 3.5 - 5.1 mmol/L 4.0  4.2  4.2    Chloride 98 - 111 mmol/L 105  103  106   CO2 22 - 32 mmol/L 28  28  26    Calcium 8.9 - 10.3 mg/dL 9.0  9.0  9.3   Total Protein 6.5 - 8.1 g/dL 6.5  6.1  6.8   Total Bilirubin 0.3 - 1.2 mg/dL 2.4  1.9  1.7   Alkaline Phos 38 - 126 U/L 64  59  64   AST 15 - 41 U/L 23  22  20    ALT 0 - 44 U/L 26  24  24     RADIOGRAPHIC STUDIES: No results found.  ASSESSMENT & PLAN Michael Vaughan 57 y.o. male with medical history significant for hereditary hemochromatosis who presents for a follow up visit.   After review of the labs, review of the records, and discussion with the patient the patients findings are most consistent with hereditary hemochromatosis from a homozygous C282Y mutation.   Elevated serum ferritin levels have numerous possible etiologies. These include hereditary hemochromatosis (heterozygous or homozygous), inflammation, liver disease, or iron overload from an exogenous source. Hereditary hemochromatosis is a hereditary condition caused by mutations in the HFE gene, which regulates iron absorption. The most common genes mutated in this condition are the C282Y and H63D genes. Homozygous mutations represent a disease state which requires phlebotomy to decrease ferritin levels to a goal of <50  (Blood (2010) 116 (3): 317-325). The goal is to decrease ferritin so there is no deposition in critical organs (liver, heart, pancreas and thyroid). Heterozygous mutations (or compound heterozygotes) rarely require phlebotomy, but do  have elevated serum iron/ferritin levels.  Ferritin is an acute phase reactant and can be elevated with systemic inflammation. Direct damage to liver tissue can also cause spillage of ferritin into the blood, resulting in elevated ferritin.  Additionally, serum iron levels can be quite transient and an elevation or serum iron may not represent a true overload of total body iron (best lab for this is ferritin).     #Homozygous Hereditary Hemochromatosis  --labs to include  CBC, CMP --patient has prior testing for TSH/T4 and Hgb A1c --will repeat iron panel and ferritin today --prior positive testing for HFE gene mutation. Found to have homozygous mutation for C282Y. -- no inidication for phlebotomy at this time. We will begin phlebotomies every other week if ferritin increases above 150.  --Labs today show white blood cell count 7.4, Hgb 15.7, MCV 97.7, Plt 211.  Ferritin 102.  As long as ferritin is <150 there is no need for phlebotomy --RTC in 6 months with interval 3 month labs.     No orders of the defined types were placed in this encounter.   All questions were answered. The patient knows to call the clinic with any problems, questions or concerns.  A total of more than 30 minutes were spent on this encounter with face-to-face time and non-face-to-face time, including preparing to see the patient, ordering tests and/or medications, counseling the patient and coordination of care as outlined above.   Ulysees Barns, MD Department of Hematology/Oncology [facility] Phone: [phone] Pager: [phone] Email: [email or web address]  12/21/2022 10:54 AM

## 2022-12-21 ENCOUNTER — Inpatient Hospital Stay: Payer: Medicare PPO | Attending: Hematology and Oncology

## 2022-12-21 ENCOUNTER — Other Ambulatory Visit: Payer: Self-pay

## 2022-12-21 ENCOUNTER — Inpatient Hospital Stay: Payer: Medicare PPO | Admitting: Hematology and Oncology

## 2022-12-21 DIAGNOSIS — Z806 Family history of leukemia: Secondary | ICD-10-CM | POA: Insufficient documentation

## 2022-12-21 DIAGNOSIS — Z8042 Family history of malignant neoplasm of prostate: Secondary | ICD-10-CM | POA: Insufficient documentation

## 2022-12-21 DIAGNOSIS — H3552 Pigmentary retinal dystrophy: Secondary | ICD-10-CM | POA: Insufficient documentation

## 2022-12-21 DIAGNOSIS — Z803 Family history of malignant neoplasm of breast: Secondary | ICD-10-CM | POA: Diagnosis not present

## 2022-12-21 DIAGNOSIS — Z87891 Personal history of nicotine dependence: Secondary | ICD-10-CM | POA: Diagnosis not present

## 2022-12-21 DIAGNOSIS — Z800 Family history of malignant neoplasm of digestive organs: Secondary | ICD-10-CM | POA: Diagnosis not present

## 2022-12-21 LAB — CMP (CANCER CENTER ONLY)
ALT: 26 U/L (ref 0–44)
AST: 23 U/L (ref 15–41)
Albumin: 3.9 g/dL (ref 3.5–5.0)
Alkaline Phosphatase: 64 U/L (ref 38–126)
Anion gap: 6 (ref 5–15)
BUN: 14 mg/dL (ref 6–20)
CO2: 28 mmol/L (ref 22–32)
Calcium: 9 mg/dL (ref 8.9–10.3)
Chloride: 105 mmol/L (ref 98–111)
Creatinine: 0.86 mg/dL (ref 0.61–1.24)
GFR, Estimated: >60 mL/min (ref >60)
Glucose, Bld: 104 mg/dL — ABNORMAL HIGH (ref 70–99)
Potassium: 4 mmol/L (ref 3.5–5.1)
Sodium: 139 mmol/L (ref 135–145)
Total Bilirubin: 2.4 mg/dL — ABNORMAL HIGH (ref 0.3–1.2)
Total Protein: 6.5 g/dL (ref 6.5–8.1)

## 2022-12-21 LAB — CBC WITH DIFFERENTIAL (CANCER CENTER ONLY)
Abs Immature Granulocytes: 0.03 10*3/uL (ref 0.00–0.07)
Basophils Absolute: 0.1 10*3/uL (ref 0.0–0.1)
Basophils Relative: 1 %
Eosinophils Absolute: 0.3 10*3/uL (ref 0.0–0.5)
Eosinophils Relative: 4 %
HCT: 42.9 % (ref 39.0–52.0)
Hemoglobin: 15.7 g/dL (ref 13.0–17.0)
Immature Granulocytes: 0 %
Lymphocytes Relative: 29 %
Lymphs Abs: 2.2 10*3/uL (ref 0.7–4.0)
MCH: 35.8 pg — ABNORMAL HIGH (ref 26.0–34.0)
MCHC: 36.6 g/dL — ABNORMAL HIGH (ref 30.0–36.0)
MCV: 97.7 fL (ref 80.0–100.0)
Monocytes Absolute: 0.5 10*3/uL (ref 0.1–1.0)
Monocytes Relative: 7 %
Neutro Abs: 4.4 10*3/uL (ref 1.7–7.7)
Neutrophils Relative %: 59 %
Platelet Count: 211 10*3/uL (ref 150–400)
RBC: 4.39 MIL/uL (ref 4.22–5.81)
RDW: 13.1 % (ref 11.5–15.5)
WBC Count: 7.4 10*3/uL (ref 4.0–10.5)
nRBC: 0 % (ref 0.0–0.2)

## 2022-12-21 LAB — RETIC PANEL
Immature Retic Fract: 12 % (ref 2.3–15.9)
RBC.: 4.4 MIL/uL (ref 4.22–5.81)
Retic Count, Absolute: 103.4 10*3/uL (ref 19.0–186.0)
Retic Ct Pct: 2.4 % (ref 0.4–3.1)
Reticulocyte Hemoglobin: 38.8 pg (ref >27.9)

## 2022-12-21 LAB — IRON AND IRON BINDING CAPACITY (CC-WL,HP ONLY)
Iron: 235 ug/dL — ABNORMAL HIGH (ref 45–182)
Saturation Ratios: 98 % — ABNORMAL HIGH (ref 17.9–39.5)
TIBC: 239 ug/dL — ABNORMAL LOW (ref 250–450)
UIBC: 4 ug/dL — ABNORMAL LOW (ref 117–376)

## 2022-12-21 LAB — FERRITIN: Ferritin: 102 ng/mL (ref 24–336)

## 2022-12-22 ENCOUNTER — Ambulatory Visit (INDEPENDENT_AMBULATORY_CARE_PROVIDER_SITE_OTHER): Payer: Medicare PPO

## 2022-12-22 VITALS — Ht 70.0 in | Wt 201.0 lb

## 2022-12-22 DIAGNOSIS — Z0000 Encounter for general adult medical examination without abnormal findings: Secondary | ICD-10-CM

## 2022-12-22 NOTE — Progress Notes (Signed)
 Subjective:   Michael Vaughan is a 57 y.o. male who presents for Medicare Annual/Subsequent preventive examination.  Visit Complete: Virtual  I connected with  Rachael Darby on 12/22/22 by a audio enabled telemedicine application and verified that I am speaking with the correct person using two identifiers.  Patient Location: Home  Provider Location: Home Office  I discussed the limitations of evaluation and management by telemedicine. The patient expressed understanding and agreed to proceed.  Patient Medicare AWV questionnaire was completed by the patient on 12/21/22; I have confirmed that all information answered by patient is correct and no changes since this date.  Review of Systems     Cardiac Risk Factors include: advanced age (>83men, >63 women);smoking/ tobacco exposure;male gender     Objective:    Today's Vitals   12/22/22 1028  Weight: 201 lb (91.2 kg)  Height: 5\' 10"  (1.778 m)   Body mass index is 28.84 kg/m.     12/22/2022   10:36 AM 04/27/2021    6:58 AM 04/16/2021    9:57 AM 01/06/2017    7:48 AM 12/22/2016    2:45 PM 10/27/2015    8:40 AM 01/14/2014    8:00 PM  Advanced Directives  Does Patient Have a Medical Advance Directive? No No No No No No Patient does not have advance directive;Patient would not like information  Would patient like information on creating a medical advance directive? No - Patient declined No - Patient declined    Yes - Educational materials given     Current Medications (verified) Outpatient Encounter Medications as of 12/22/2022  Medication Sig   pilocarpine (PILOCAR) 1 % ophthalmic solution Place 1 drop into the left eye 2 (two) times daily.    No facility-administered encounter medications on file as of 12/22/2022.    Allergies (verified) Augmentin [amoxicillin-pot clavulanate]   History: Past Medical History:  Diagnosis Date   Allergy    Arthralgia    takes aleve regularly   Arthritis    Cataract    Heartburn     Hereditary hemochromatosis (HCC) 05/2013   iron overload with mild HM, homozygous for C282Y, monthly blood draws   History of chicken pox    History of migraine 1990s   remote   Retinitis pigmentosa    Past Surgical History:  Procedure Laterality Date   CATARACT EXTRACTION BILATERAL W/ ANTERIOR VITRECTOMY Bilateral 2001   COLONOSCOPY  12/2016   WNL ([facility])   INGUINAL HERNIA REPAIR  1972   at age 32   IRIDECTOMY Right 01/14/2014   Procedure: IRIDECTOMY;  Surgeon: Sherrie George, MD;  Location: [facility] OR;  Service: Ophthalmology;  Laterality: Right;   PARS PLANA VITRECTOMY  01/24/2012   Procedure: PARS PLANA VITRECTOMY WITH 25G REMOVAL/SUTURE INTRAOCULAR LENS;  Surgeon: Sherrie George, MD;  Location: [facility] OR;  Service: Ophthalmology;  Laterality: Left;   PARS PLANA VITRECTOMY Right 01/14/2014   dislocated intraocular lens - PARS PLANA VITRECTOMY WITH 25G REMOVAL/SUTURE SECONDARY INTRAOCULAR LENS;  Sherrie George, MD   PHOTOCOAGULATION WITH LASER Right 01/14/2014   Procedure: PHOTOCOAGULATION WITH LASER;  Surgeon: Sherrie George, MD;  Location: [facility] OR;  Service: Ophthalmology;  Laterality: Right;   TOTAL HIP ARTHROPLASTY Right 04/27/2021   Procedure: TOTAL HIP ARTHROPLASTY ANTERIOR APPROACH;  Surgeon: Durene Romans, MD;  Location: [facility] ORS;  Service: Orthopedics;  Laterality: Right;   Family History  Problem Relation Age of Onset   Colon cancer Paternal Aunt 61   Leukemia Father  CLL spread to brain   Hypertension Father        and mother and family   Breast cancer Paternal Aunt    Prostate cancer Brother 47   Hyperlipidemia Other        father's side   CAD Paternal Uncle        MI, CABG   Stroke Maternal Grandmother    Diabetes Other        mother's side   Deep vein thrombosis Brother        with PE   Stroke Maternal Grandfather    Hypertension Mother    Esophageal cancer Neg Hx    Liver cancer Neg Hx    Pancreatic cancer Neg Hx    Rectal cancer Neg Hx    Stomach cancer Neg Hx     Social History   Socioeconomic History   Marital status: Single    Spouse name: Not on file   Number of children: 2   Years of education: Not on file   Highest education level: Not on file  Occupational History   Occupation: disabled  Tobacco Use   Smoking status: Former    Current packs/day: 0.00    Types: Cigarettes    Quit date: 03/12/2004    Years since quitting: 18.7   Smokeless tobacco: Current    Types: Snuff    Last attempt to quit: 10/12/2015   Tobacco comments:    quit in 2005, occasional smokeless tobacco - now using the [employer]yan express tobacco free snuff  Vaping Use   Vaping status: Never Used  Substance and Sexual Activity   Alcohol use: Yes    Alcohol/week: 0.0 standard drinks of alcohol    Comment: rarely   Drug use: No   Sexual activity: Yes  Other Topics Concern   Not on file  Social History Narrative   Caffeine: 4-6 cups coffee   Lives with wife, 1 son, other grown children, dog and cows   Occupation: on disability for vision loss, legally blind, raises cows   Edu: HS   Activity: raises cows, some hunting/fishing   Diet: good water, daily fruits/vegetables   Social Determinants of Health   Financial Resource Strain: Low Risk  (12/22/2022)   Overall Financial Resource Strain (CARDIA)    Difficulty of Paying Living Expenses: Not very hard  Food Insecurity: No Food Insecurity (12/22/2022)   Hunger Vital Sign    Worried About Running Out of Food in the Last Year: Never true    Ran Out of Food in the Last Year: Never true  Transportation Needs: No Transportation Needs (12/22/2022)   PRAPARE - Administrator, Civil Service (Medical): No    Lack of Transportation (Non-Medical): No  Physical Activity: Insufficiently Active (12/22/2022)   Exercise Vital Sign    Days of Exercise per Week: 2 days    Minutes of Exercise per Session: 20 min  Stress: No Stress Concern Present (12/22/2022)   [employer] of Occupational Health - Occupational  Stress Questionnaire    Feeling of Stress : Not at all  Social Connections: Moderately Integrated (12/22/2022)   Social Connection and Isolation Panel [NHANES]    Frequency of Communication with Friends and Family: More than three times a week    Frequency of Social Gatherings with Friends and Family: More than three times a week    Attends Religious Services: Never    Database administrator or Organizations: Yes    Attends Banker Meetings:  More than 4 times per year    Marital Status: Married    Tobacco Counseling Ready to quit: Yes Counseling given: Yes Tobacco comments: quit in 2005, occasional smokeless tobacco - now using the [employer] tobacco free snuff   Clinical Intake:  Pre-visit preparation completed: Yes  Pain : No/denies pain     BMI - recorded: 28.84 Nutritional Status: BMI 25 -29 Overweight Nutritional Risks: None Diabetes: No  How often do you need to have someone help you when you read instructions, pamphlets, or other written materials from your doctor or pharmacy?: 1 - Never  Interpreter Needed?: No  Information entered by ::  Orval Dortch, CMA   Activities of Daily Living    12/22/2022   10:42 AM 12/21/2022    3:10 PM  In your present state of health, do you have any difficulty performing the following activities:  Hearing? 0 0  Vision? 1 1  Difficulty concentrating or making decisions? 0 0  Walking or climbing stairs? 0 0  Dressing or bathing? 0 0  Doing errands, shopping? 1 1  Preparing Food and eating ? N N  Using the Toilet? N N  In the past six months, have you accidently leaked urine? N N  Do you have problems with loss of bowel control? N N  Managing your Medications? N N  Managing your Finances? N N  Housekeeping or managing your Housekeeping? N N    Patient Care Team: Eustaquio Boyden, MD as PCP - General (Family Medicine) Sherrie George, MD as Consulting Physician (Ophthalmology) Meryl Dare, MD as  Consulting Physician (Gastroenterology)  Indicate any recent Medical Services you may have received from other than [facility] providers in the past year (date may be approximate).     Assessment:   This is a routine wellness examination for Wendelin.  Hearing/Vision screen Hearing Screening - Comments:: Patient denies any hearing difficulties.    Dietary issues and exercise activities discussed:     Goals Addressed             This Visit's Progress    Patient Stated       Patient states he wants to fish more.        Depression Screen    12/22/2022   10:32 AM 12/22/2021    8:59 AM 12/21/2020    9:08 AM 12/19/2019    8:38 AM 12/06/2017    5:44 PM 10/27/2015    8:25 AM 09/10/2014   10:47 AM  PHQ 2/9 Scores  PHQ - 2 Score 0 0 0 0 0 0 0    Fall Risk    12/22/2022   10:36 AM 12/21/2022    3:10 PM 12/18/2022    4:39 PM 12/22/2021    8:58 AM 12/21/2020    9:08 AM  Fall Risk   Falls in the past year? 0 0 0 0 0  Number falls in past yr: 0 0 0    Injury with Fall? 0      Risk for fall due to : No Fall Risks      Follow up Falls prevention discussed    Falls evaluation completed    MEDICARE RISK AT HOME:  Medicare Risk at Home - 12/22/22 1037     Any stairs in or around the home? Yes    If so, are there any without handrails? No    Home free of loose throw rugs in walkways, pet beds, electrical cords, etc? Yes    Adequate lighting  in your home to reduce risk of falls? Yes    Life alert? No    Use of a cane, walker or w/c? No    Grab bars in the bathroom? No    Shower chair or bench in shower? No    Elevated toilet seat or a handicapped toilet? No             TIMED UP AND GO:  Was the test performed?  No    Cognitive Function:    10/27/2015    8:32 AM  MMSE - Mini Mental State Exam  Orientation to time 5  Orientation to Place 5  Registration 3  Attention/ Calculation 0  Recall 3  Language- name 2 objects 0  Language- repeat 1  Language- follow 3 step command 3   Language- read & follow direction 0  Write a sentence 0  Copy design 0  Total score 20        12/22/2022   10:37 AM  6CIT Screen  What Year? 0 points  What month? 0 points  What time? 0 points  Count back from 20 0 points  Months in reverse 0 points  Repeat phrase 0 points  Total Score 0 points    Immunizations Immunization History  Administered Date(s) Administered   Influenza Inj Mdck Quad Pf 07/08/2018   Influenza Split 03/13/2012, 02/11/2013   Influenza,inj,Quad PF,6+ Mos 04/06/2017, 03/03/2021, 03/24/2022   Influenza-Unspecified 03/10/2014   PFIZER(Purple Top)SARS-COV-2 Vaccination 08/21/2019, 09/11/2019, 04/05/2021   Tdap 05/24/2012   Zoster Recombinant(Shingrix) 12/22/2021, 04/27/2022    TDAP status: Due, Education has been provided regarding the importance of this vaccine. Advised may receive this vaccine at local pharmacy or Health Dept. Aware to provide a copy of the vaccination record if obtained from local pharmacy or Health Dept. Verbalized acceptance and understanding.  Flu Vaccine status: Up to date  Pneumococcal vaccine status: NOT AGE APPROPRIATE FOR THIS PATIENT   Covid-19 vaccine status: Information provided on how to obtain vaccines.   Qualifies for Shingles Vaccine? Yes   Zostavax completed Yes   Shingrix Completed?: Yes  Screening Tests Health Maintenance  Topic Date Due   COVID-19 Vaccine (4 - 2023-24 season) 02/11/2022   DTaP/Tdap/Td (2 - Td or Tdap) 05/24/2022   INFLUENZA VACCINE  01/12/2023   Medicare Annual Wellness (AWV)  12/22/2023   Colonoscopy  01/07/2027   Hepatitis C Screening  Completed   HIV Screening  Completed   Zoster Vaccines- Shingrix  Completed   HPV VACCINES  Aged Out    Health Maintenance  Health Maintenance Due  Topic Date Due   COVID-19 Vaccine (4 - 2023-24 season) 02/11/2022   DTaP/Tdap/Td (2 - Td or Tdap) 05/24/2022    Colorectal cancer screening: Type of screening: Colonoscopy. Completed 01/06/17. Repeat  every 10 years  Lung Cancer Screening: (Low Dose CT Chest recommended if Age 52-80 years, 20 pack-year currently smoking OR have quit w/in 15years.) does not qualify.   Additional Screening:  Hepatitis C Screening: does not qualify; Completed 06/29/2013  Vision Screening: Recommended annual ophthalmology exams for early detection of glaucoma and other disorders of the eye. Is the patient up to date with their annual eye exam?  Yes  Who is the provider or what is the name of the office in which the patient attends annual eye exams? [facility]  Dental Screening: Recommended annual dental exams for proper oral hygiene  Diabetic Foot Exam: n/a  Community Resource Referral / Chronic Care Management: CRR required this visit?  No   CCM required this visit?  No     Plan:     I have personally reviewed and noted the following in the patient's chart:   Medical and social history Use of alcohol, tobacco or illicit drugs  Current medications and supplements including opioid prescriptions. Patient is not currently taking opioid prescriptions. Functional ability and status Nutritional status Physical activity Advanced directives List of other physicians Hospitalizations, surgeries, and ER visits in previous 12 months Vitals Screenings to include cognitive, depression, and falls Referrals and appointments  In addition, I have reviewed and discussed with patient certain preventive protocols, quality metrics, and best practice recommendations. A written personalized care plan for preventive services as well as general preventive health recommendations were provided to patient.   Any medications not marked as taking were not mentioned by the patient (or their caregiver if applicable) when reconciling the medications.  Because this visit was a virtual/telehealth visit,  certain criteria was not obtained, such a blood pressure, CBG if patient is a diabetic, and timed up and go.    Jordan Hawks Floria Brandau, CMA   12/22/2022   After Visit Summary: (MyChart) Due to this being a telephonic visit, the after visit summary with patients personalized plan was offered to patient via MyChart   Nurse Notes:

## 2022-12-22 NOTE — Patient Instructions (Signed)
[name] , Thank you for taking time to come for your Medicare Wellness Visit. I appreciate your ongoing commitment to your health goals. Please review the following plan we discussed and let me know if I can assist you in the future.   These are the goals we discussed:  Goals      Increase physical activity     Starting 10/27/2015, I will continue to walk at least 30 min daily.      Patient Stated     Patient states he wants to fish more.         This is a list of the screening recommended for you and due dates:  Health Maintenance  Topic Date Due   COVID-19 Vaccine (4 - 2023-24 season) 02/11/2022   DTaP/Tdap/Td vaccine (2 - Td or Tdap) 05/24/2022   Flu Shot  01/12/2023   Medicare Annual Wellness Visit  12/22/2023   Colon Cancer Screening  01/07/2027   Hepatitis C Screening  Completed   HIV Screening  Completed   Zoster (Shingles) Vaccine  Completed   HPV Vaccine  Aged Out    Advanced directives: Advance directive discussed with you today. Even though you declined this today, please call our office should you change your mind, and we can give you the proper paperwork for you to fill out. Advance care planning is a way to make decisions about medical care that fits your values in case you are ever unable to make these decisions for yourself.  Information on Advanced Care Planning can be found at [facility] Advance Health Care Directives Advance Health Care Directives ([email or web address]    Conditions/risks identified: You are due for the vaccines checked below. You may have these done at your preferred pharmacy. Please have them fax the office proof of the vaccines so that we can update your chart.   []  Flu (due annually) []  Shingrix (Shingles vaccine) []  Pneumonia Vaccines [x]  TDAP (Tetanus) Vaccine every 10 years [x]  Covid-19   Next appointment: VIRTUAL/ TELEPHONE VISIT Follow up in one year for your annual wellness visit December 26, 2023 at 11:15am  telephone visit   Preventive Care 40-64 Years, Male Preventive care refers to lifestyle choices and visits with your health care provider that can promote health and wellness. What does preventive care include? A yearly physical exam. This is also called an annual well check. Dental exams once or twice a year. Routine eye exams. Ask your health care provider how often you should have your eyes checked. Personal lifestyle choices, including: Daily care of your teeth and gums. Regular physical activity. Eating a healthy diet. Avoiding tobacco and drug use. Limiting alcohol use. Practicing safe sex. Taking low-dose aspirin every day starting at age 83. What happens during an annual well check? The services and screenings done by your health care provider during your annual well check will depend on your age, overall health, lifestyle risk factors, and family history of disease. Counseling  Your health care provider may ask you questions about your: Alcohol use. Tobacco use. Drug use. Emotional well-being. Home and relationship well-being. Sexual activity. Eating habits. Work and work Astronomer. Screening  You may have the following tests or measurements: Height, weight, and BMI. Blood pressure. Lipid and cholesterol levels. These may be checked every 5 years, or more frequently if you are over 20 years old. Skin check. Lung cancer screening. You may have this screening every year starting at age 81 if you have a 30-pack-year history of  smoking and currently smoke or have quit within the past 15 years. Fecal occult blood test (FOBT) of the stool. You may have this test every year starting at age 48. Flexible sigmoidoscopy or colonoscopy. You may have a sigmoidoscopy every 5 years or a colonoscopy every 10 years starting at age 53. Prostate cancer screening. Recommendations will vary depending on your family history and other risks. Hepatitis C blood test. Hepatitis B blood  test. Sexually transmitted disease (STD) testing. Diabetes screening. This is done by checking your blood sugar (glucose) after you have not eaten for a while (fasting). You may have this done every 1-3 years. Discuss your test results, treatment options, and if necessary, the need for more tests with your health care provider. Vaccines  Your health care provider may recommend certain vaccines, such as: Influenza vaccine. This is recommended every year. Tetanus, diphtheria, and acellular pertussis (Tdap, Td) vaccine. You may need a Td booster every 10 years. Zoster vaccine. You may need this after age 71. Pneumococcal 13-valent conjugate (PCV13) vaccine. You may need this if you have certain conditions and have not been vaccinated. Pneumococcal polysaccharide (PPSV23) vaccine. You may need one or two doses if you smoke cigarettes or if you have certain conditions. Talk to your health care provider about which screenings and vaccines you need and how often you need them. This information is not intended to replace advice given to you by your health care provider. Make sure you discuss any questions you have with your health care provider. Document Released: 06/26/2015 Document Revised: 02/17/2016 Document Reviewed: 03/31/2015 Elsevier Interactive Patient Education  2017 [employer].  Fall Prevention in the Home Falls can cause injuries. They can happen to people of all ages. There are many things you can do to make your home safe and to help prevent falls. What can I do on the outside of my home? Regularly fix the edges of walkways and driveways and fix any cracks. Remove anything that might make you trip as you walk through a door, such as a raised step or threshold. Trim any bushes or trees on the path to your home. Use bright outdoor lighting. Clear any walking paths of anything that might make someone trip, such as rocks or tools. Regularly check to see if handrails are loose or broken.  Make sure that both sides of any steps have handrails. Any raised decks and porches should have guardrails on the edges. Have any leaves, snow, or ice cleared regularly. Use sand or salt on walking paths during winter. Clean up any spills in your garage right away. This includes oil or grease spills. What can I do in the bathroom? Use night lights. Install grab bars by the toilet and in the tub and shower. Do not use towel bars as grab bars. Use non-skid mats or decals in the tub or shower. If you need to sit down in the shower, use a plastic, non-slip stool. Keep the floor dry. Clean up any water that spills on the floor as soon as it happens. Remove soap buildup in the tub or shower regularly. Attach bath mats securely with double-sided non-slip rug tape. Do not have throw rugs and other things on the floor that can make you trip. What can I do in the bedroom? Use night lights. Make sure that you have a light by your bed that is easy to reach. Do not use any sheets or blankets that are too big for your bed. They should not hang down  onto the floor. Have a firm chair that has side arms. You can use this for support while you get dressed. Do not have throw rugs and other things on the floor that can make you trip. What can I do in the kitchen? Clean up any spills right away. Avoid walking on wet floors. Keep items that you use a lot in easy-to-reach places. If you need to reach something above you, use a strong step stool that has a grab bar. Keep electrical cords out of the way. Do not use floor polish or wax that makes floors slippery. If you must use wax, use non-skid floor wax. Do not have throw rugs and other things on the floor that can make you trip. What can I do with my stairs? Do not leave any items on the stairs. Make sure that there are handrails on both sides of the stairs and use them. Fix handrails that are broken or loose. Make sure that handrails are as long as the  stairways. Check any carpeting to make sure that it is firmly attached to the stairs. Fix any carpet that is loose or worn. Avoid having throw rugs at the top or bottom of the stairs. If you do have throw rugs, attach them to the floor with carpet tape. Make sure that you have a light switch at the top of the stairs and the bottom of the stairs. If you do not have them, ask someone to add them for you. What else can I do to help prevent falls? Wear shoes that: Do not have high heels. Have rubber bottoms. Are comfortable and fit you well. Are closed at the toe. Do not wear sandals. If you use a stepladder: Make sure that it is fully opened. Do not climb a closed stepladder. Make sure that both sides of the stepladder are locked into place. Ask someone to hold it for you, if possible. Clearly mark and make sure that you can see: Any grab bars or handrails. First and last steps. Where the edge of each step is. Use tools that help you move around (mobility aids) if they are needed. These include: Canes. Walkers. Scooters. Crutches. Turn on the lights when you go into a dark area. Replace any light bulbs as soon as they burn out. Set up your furniture so you have a clear path. Avoid moving your furniture around. If any of your floors are uneven, fix them. If there are any pets around you, be aware of where they are. Review your medicines with your doctor. Some medicines can make you feel dizzy. This can increase your chance of falling. Ask your doctor what other things that you can do to help prevent falls. This information is not intended to replace advice given to you by your health care provider. Make sure you discuss any questions you have with your health care provider. Document Released: 03/26/2009 Document Revised: 11/05/2015 Document Reviewed: 07/04/2014 Elsevier Interactive Patient Education  2017 [employer].

## 2022-12-23 ENCOUNTER — Telehealth: Payer: Self-pay | Admitting: *Deleted

## 2022-12-23 NOTE — Telephone Encounter (Signed)
[name] regarding recent lab results.  Spoke with patient. Advised that his ferritin is 102. He is currently at our target of less than 150. We plan to see him back for labs in October and another clinic visit in January 2025 Pt voiced understanding and is aware of future appts.

## 2022-12-23 NOTE — Telephone Encounter (Signed)
-----   Message from Ulysees Barns IV sent at 12/21/2022  5:29 PM EDT ----- Please let Mr. Hickox know that his ferritin is 102.  He is currently at our target of less than 150.  Will plan to see him back for labs in October and another clinic visit in January 2025. ----- Message ----- From: Leory Plowman, Lab In [place] Sent: 12/21/2022  10:09 AM EDT To: Jaci Standard, MD

## 2022-12-26 ENCOUNTER — Encounter: Payer: Self-pay | Admitting: Family Medicine

## 2022-12-26 ENCOUNTER — Ambulatory Visit: Payer: Medicare PPO | Admitting: Family Medicine

## 2022-12-26 VITALS — BP 156/94 | HR 90 | Temp 97.5°F | Ht 68.75 in | Wt 196.1 lb

## 2022-12-26 DIAGNOSIS — Z0000 Encounter for general adult medical examination without abnormal findings: Secondary | ICD-10-CM | POA: Diagnosis not present

## 2022-12-26 DIAGNOSIS — H3552 Pigmentary retinal dystrophy: Secondary | ICD-10-CM

## 2022-12-26 DIAGNOSIS — Z7189 Other specified counseling: Secondary | ICD-10-CM

## 2022-12-26 DIAGNOSIS — E785 Hyperlipidemia, unspecified: Secondary | ICD-10-CM

## 2022-12-26 DIAGNOSIS — I10 Essential (primary) hypertension: Secondary | ICD-10-CM | POA: Diagnosis not present

## 2022-12-26 DIAGNOSIS — E038 Other specified hypothyroidism: Secondary | ICD-10-CM

## 2022-12-26 MED ORDER — AMLODIPINE BESYLATE 5 MG PO TABS: 5.0000 mg | ORAL_TABLET | Freq: Every day | ORAL | 6 refills | Status: DC

## 2022-12-26 NOTE — Progress Notes (Signed)
Ph: [phone] Fax: [phone]   Patient ID: Michael Vaughan, male    DOB: April 30, 1966, 57 y.o.   MRN: [id number]  This visit was conducted in person.  BP (!) 156/94 (BP Location: Right Arm, Cuff Size: Normal)   Pulse 90   Temp (!) 97.5 F (36.4 C) (Temporal)   Ht 5' 8.75" (1.746 m)   Wt 196 lb 2 oz (89 kg)   SpO2 97%   BMI 29.17 kg/m   BP Readings from Last 3 Encounters:  12/26/22 (!) 156/94  12/21/22 (!) 174/96  06/20/22 (!) 159/93  Home BP 150/80s.   CC: CPE Subjective:   HPI: Michael Vaughan is a 57 y.o. male presenting on 12/26/2022 for Annual Exam (MCR prt 2 [AWV- 12/22/22].)   Saw health advisor last week for medicare wellness visit. Note reviewed.   No results found.  Flowsheet Row Clinical Support from 12/22/2022 in [facility] [place]  PHQ-2 Total Score 0          12/22/2022   10:36 AM 12/21/2022    3:10 PM 12/18/2022    4:39 PM 12/22/2021    8:58 AM 12/21/2020    9:08 AM  Fall Risk   Falls in the past year? 0 0 0 0 0  Number falls in past yr: 0 0 0    Injury with Fall? 0      Risk for fall due to : No Fall Risks      Follow up Falls prevention discussed    Falls evaluation completed   H/o retinitis pigmentosa followed by eye doctor yearly [name]) - he no longer drives (since 2000).    Hemochromatosis with iron overload homozygous for C282Y mutation, has established with heme (Dr [name]). Last seen last week.    S/p R hip replacement 04/2021 [name]).   BP staying elevated at home. No HA, vision changes, CP/tightness, SOB, leg swelling.   Preventative: COLONOSCOPY 12/2016 WNL [name]).  Prostate cancer screening - brother developed prostate cancer at age 75 yo. Continue yearly PSA.  Lung cancer screening - not eligible  Flu shot yearly  COVID vaccine - Pfizer 08/2019 x2, booster 03/2021. 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   Advanced directive - has not set up yet. Would want brother and 2 sons to be HCPOA. Packet previously provided.  Seat belt use discussed.  Sunscreen use discussed. No changing moles on skin.  Sleep - averaging 7 hours/night Ex smoker - quit 2005  Alcohol - 2 beers several times a week Dentist - yearly Eye exam - sees retinologist [name] yearly (retinitis pigmentosa)  Bowel - no constipation Bladder - no incontinence  Caffeine: 4-6 cups coffee  Separated from wife  Lives with 1 son, has other grown children, dog, chickens and cows Occupation: on disability for vision loss, legally blind, raises cows Edu: HS  Activity: stays active on farm, raises cows, some hunting/fishing  Diet: good water, daily fruits/vegetables      Relevant past medical, surgical, family and social history reviewed and updated as indicated. Interim medical history since our last visit reviewed. Allergies and medications reviewed and updated. Outpatient Medications Prior to Visit  Medication Sig Dispense Refill   pilocarpine (PILOCAR) 1 % ophthalmic solution Place 1 drop into the left eye 2 (two) times daily.      No facility-administered medications prior to visit.     Per HPI unless  specifically indicated in ROS section below Review of Systems  Constitutional:  Negative for activity change, appetite change, chills, fatigue, fever and unexpected weight change.  HENT:  Negative for hearing loss.   Eyes:  Negative for visual disturbance.  Respiratory:  Negative for cough, chest tightness, shortness of breath and wheezing.   Cardiovascular:  Negative for chest pain, palpitations and leg swelling.  Gastrointestinal:  Negative for abdominal distention, abdominal pain, blood in stool, constipation, diarrhea, nausea and vomiting.  Genitourinary:  Negative for difficulty urinating and hematuria.  Musculoskeletal:  Negative for arthralgias, myalgias and neck pain.  Skin:  Negative for rash.  Neurological:   Negative for dizziness, seizures, syncope and headaches.  Hematological:  Negative for adenopathy. Does not bruise/bleed easily.  Psychiatric/Behavioral:  Negative for dysphoric mood. The patient is not nervous/anxious.     Objective:  BP (!) 156/94 (BP Location: Right Arm, Cuff Size: Normal)   Pulse 90   Temp (!) 97.5 F (36.4 C) (Temporal)   Ht 5' 8.75" (1.746 m)   Wt 196 lb 2 oz (89 kg)   SpO2 97%   BMI 29.17 kg/m   Wt Readings from Last 3 Encounters:  12/26/22 196 lb 2 oz (89 kg)  12/22/22 201 lb (91.2 kg)  12/21/22 201 lb 6.4 oz (91.4 kg)      Physical Exam Vitals and nursing note reviewed.  Constitutional:      General: He is not in acute distress.    Appearance: Normal appearance. He is well-developed. He is not ill-appearing.  HENT:     Head: Normocephalic and atraumatic.     Right Ear: Hearing, tympanic membrane, ear canal and external ear normal.     Left Ear: Hearing, tympanic membrane, ear canal and external ear normal.     Mouth/Throat:     Mouth: Mucous membranes are moist.     Pharynx: Oropharynx is clear. No oropharyngeal exudate or posterior oropharyngeal erythema.  Eyes:     General: No scleral icterus.    Extraocular Movements: Extraocular movements intact.     Conjunctiva/sclera: Conjunctivae normal.     Pupils: Pupils are equal, round, and reactive to light.  Neck:     Thyroid: No thyroid mass or thyromegaly.  Cardiovascular:     Rate and Rhythm: Normal rate and regular rhythm.     Pulses: Normal pulses.          Radial pulses are 2+ on the right side and 2+ on the left side.     Heart sounds: Normal heart sounds. No murmur heard. Pulmonary:     Effort: Pulmonary effort is normal. No respiratory distress.     Breath sounds: Normal breath sounds. No wheezing, rhonchi or rales.  Abdominal:     General: Bowel sounds are normal. There is no distension.     Palpations: Abdomen is soft. There is no mass.     Tenderness: There is no abdominal  tenderness. There is no guarding or rebound.     Hernia: No hernia is present.  Musculoskeletal:        General: Normal range of motion.     Cervical back: Normal range of motion and neck supple.     Right lower leg: No edema.     Left lower leg: No edema.  Lymphadenopathy:     Cervical: No cervical adenopathy.  Skin:    General: Skin is warm and dry.     Findings: No rash.  Neurological:     General: No focal  deficit present.     Mental Status: He is alert and oriented to person, place, and time.  Psychiatric:        Mood and Affect: Mood normal.        Behavior: Behavior normal.        Thought Content: Thought content normal.        Judgment: Judgment normal.       Results for orders placed or performed in visit on 12/21/22  Retic Panel  Result Value Ref Range   Retic Ct Pct 2.4 0.4 - 3.1 %   RBC. 4.40 4.22 - 5.81 MIL/uL   Retic Count, Absolute 103.4 19.0 - 186.0 K/uL   Immature Retic Fract 12.0 2.3 - 15.9 %   Reticulocyte Hemoglobin 38.8 >27.9 pg  Iron and Iron Binding Capacity ([facility] only)  Result Value Ref Range   Iron 235 (H) 45 - 182 ug/dL   TIBC 782 (L) 956 - 213 ug/dL   Saturation Ratios 98 (H) 17.9 - 39.5 %   UIBC 4 (L) 117 - 376 ug/dL  Ferritin  Result Value Ref Range   Ferritin 102 24 - 336 ng/mL  CMP (Cancer Center only)  Result Value Ref Range   Sodium 139 135 - 145 mmol/L   Potassium 4.0 3.5 - 5.1 mmol/L   Chloride 105 98 - 111 mmol/L   CO2 28 22 - 32 mmol/L   Glucose, Bld 104 (H) 70 - 99 mg/dL   BUN 14 6 - 20 mg/dL   Creatinine 0.86 5.78 - 1.24 mg/dL   Calcium 9.0 8.9 - 46.9 mg/dL   Total Protein 6.5 6.5 - 8.1 g/dL   Albumin 3.9 3.5 - 5.0 g/dL   AST 23 15 - 41 U/L   ALT 26 0 - 44 U/L   Alkaline Phosphatase 64 38 - 126 U/L   Total Bilirubin 2.4 (H) 0.3 - 1.2 mg/dL   GFR, Estimated >62 >95 mL/min   Anion gap 6 5 - 15  CBC with Differential (Cancer Center Only)  Result Value Ref Range   WBC Count 7.4 4.0 - 10.5 K/uL   RBC 4.39 4.22 - 5.81  MIL/uL   Hemoglobin 15.7 13.0 - 17.0 g/dL   HCT 28.4 13.2 - 44.0 %   MCV 97.7 80.0 - 100.0 fL   MCH 35.8 (H) 26.0 - 34.0 pg   MCHC 36.6 (H) 30.0 - 36.0 g/dL   RDW 10.2 72.5 - 36.6 %   Platelet Count 211 150 - 400 K/uL   nRBC 0.0 0.0 - 0.2 %   Neutrophils Relative % 59 %   Neutro Abs 4.4 1.7 - 7.7 K/uL   Lymphocytes Relative 29 %   Lymphs Abs 2.2 0.7 - 4.0 K/uL   Monocytes Relative 7 %   Monocytes Absolute 0.5 0.1 - 1.0 K/uL   Eosinophils Relative 4 %   Eosinophils Absolute 0.3 0.0 - 0.5 K/uL   Basophils Relative 1 %   Basophils Absolute 0.1 0.0 - 0.1 K/uL   Immature Granulocytes 0 %   Abs Immature Granulocytes 0.03 0.00 - 0.07 K/uL    Assessment & Plan:   Problem List Items Addressed This Visit     Advanced care planning/counseling discussion (Chronic)    Working on this. Asked to bring [place] a copy      Health maintenance examination - Primary (Chronic)    Preventative protocols reviewed and updated unless pt declined. Discussed healthy diet and lifestyle.       Retinitis pigmentosa  Iron overload   Hereditary hemochromatosis (HCC)    Appreciate heme care.       Dyslipidemia    Chronic, stable off medication.  The ASCVD Risk score (Arnett DK, et al., 2019) failed to calculate for the following reasons:   The valid total cholesterol range is 130 to 320 mg/dL       Subclinical hypothyroidism    TSH remains elevated, fT4 normal. Pt denies significant hypothyroid symptoms.  Reassess at f/u visit.       Relevant Orders   TSH   T4, free   T3   Hypertension    BP remaining elevated. Strong fmhx hypertension Start amlodipine 5mg  daily, monitoring for pedal edema.  Update if trouble tolerating otherwise reassess control at 3 mo HTN f/u visit       Relevant Medications   amLODipine (NORVASC) 5 MG tablet     Meds ordered this encounter  Medications   amLODipine (NORVASC) 5 MG tablet    Sig: Take 1 tablet (5 mg total) by mouth daily.    Dispense:  30 tablet     Refill:  6    Orders Placed This Encounter  Procedures   TSH    Standing Status:   Future    Standing Expiration Date:   12/26/2023   T4, free    Standing Status:   Future    Standing Expiration Date:   12/26/2023   T3    Standing Status:   Future    Standing Expiration Date:   12/26/2023    Patient Instructions  Continue working on advanced directive.  Thyroid function remains borderline low - we will recheck next visit Start amlodipine 5mg  daily - sent to pharmacy.  Return in 3 months for blood pressure follow up.   Follow up plan: Return in about 3 months (around 03/28/2023) for follow up visit.  Eustaquio Boyden, MD

## 2022-12-26 NOTE — Assessment & Plan Note (Signed)
TSH remains elevated, fT4 normal. Pt denies significant hypothyroid symptoms.  Reassess at f/u visit.

## 2022-12-26 NOTE — Patient Instructions (Addendum)
Continue working on advanced directive.  Thyroid function remains borderline low - we will recheck next visit Start amlodipine 5mg  daily - sent to pharmacy.  Return in 3 months for blood pressure follow up.

## 2022-12-26 NOTE — Assessment & Plan Note (Signed)
Chronic, stable off medication.  The ASCVD Risk score (Arnett DK, et al., 2019) failed to calculate for the following reasons:   The valid total cholesterol range is 130 to 320 mg/dL

## 2022-12-26 NOTE — Assessment & Plan Note (Signed)
Working on this. Asked to bring [place] a copy

## 2022-12-26 NOTE — Assessment & Plan Note (Signed)
BP remaining elevated. Strong fmhx hypertension Start amlodipine 5mg  daily, monitoring for pedal edema.  Update if trouble tolerating otherwise reassess control at 3 mo HTN f/u visit

## 2022-12-26 NOTE — Assessment & Plan Note (Signed)
Appreciate heme care.

## 2022-12-26 NOTE — Assessment & Plan Note (Signed)
 Preventative protocols reviewed and updated unless pt declined. Discussed healthy diet and lifestyle.  

## 2023-03-21 ENCOUNTER — Other Ambulatory Visit: Payer: Self-pay | Admitting: Lab

## 2023-03-21 ENCOUNTER — Other Ambulatory Visit: Payer: Self-pay

## 2023-03-22 ENCOUNTER — Other Ambulatory Visit: Payer: Self-pay | Admitting: Hematology and Oncology

## 2023-03-22 ENCOUNTER — Inpatient Hospital Stay: Payer: Medicare PPO | Attending: Hematology and Oncology

## 2023-03-22 LAB — CMP (CANCER CENTER ONLY)
ALT: 33 U/L (ref 0–44)
AST: 27 U/L (ref 15–41)
Albumin: 4.3 g/dL (ref 3.5–5.0)
Alkaline Phosphatase: 64 U/L (ref 38–126)
Anion gap: 6 (ref 5–15)
BUN: 19 mg/dL (ref 6–20)
CO2: 28 mmol/L (ref 22–32)
Calcium: 9.5 mg/dL (ref 8.9–10.3)
Chloride: 108 mmol/L (ref 98–111)
Creatinine: 0.82 mg/dL (ref 0.61–1.24)
GFR, Estimated: >60 mL/min (ref >60)
Glucose, Bld: 122 mg/dL — ABNORMAL HIGH (ref 70–99)
Potassium: 3.8 mmol/L (ref 3.5–5.1)
Sodium: 142 mmol/L (ref 135–145)
Total Bilirubin: 2.2 mg/dL — ABNORMAL HIGH (ref 0.3–1.2)
Total Protein: 6.7 g/dL (ref 6.5–8.1)

## 2023-03-22 LAB — RETIC PANEL
Immature Retic Fract: 12.2 % (ref 2.3–15.9)
RBC.: 4.37 MIL/uL (ref 4.22–5.81)
Retic Count, Absolute: 69 10*3/uL (ref 19.0–186.0)
Retic Ct Pct: 1.6 % (ref 0.4–3.1)
Reticulocyte Hemoglobin: 38.3 pg (ref >27.9)

## 2023-03-22 LAB — CBC WITH DIFFERENTIAL (CANCER CENTER ONLY)
Abs Immature Granulocytes: 0.01 10*3/uL (ref 0.00–0.07)
Basophils Absolute: 0.1 10*3/uL (ref 0.0–0.1)
Basophils Relative: 1 %
Eosinophils Absolute: 0.3 10*3/uL (ref 0.0–0.5)
Eosinophils Relative: 4 %
HCT: 43.2 % (ref 39.0–52.0)
Hemoglobin: 15.6 g/dL (ref 13.0–17.0)
Immature Granulocytes: 0 %
Lymphocytes Relative: 26 %
Lymphs Abs: 2.1 10*3/uL (ref 0.7–4.0)
MCH: 35.3 pg — ABNORMAL HIGH (ref 26.0–34.0)
MCHC: 36.1 g/dL — ABNORMAL HIGH (ref 30.0–36.0)
MCV: 97.7 fL (ref 80.0–100.0)
Monocytes Absolute: 0.5 10*3/uL (ref 0.1–1.0)
Monocytes Relative: 7 %
Neutro Abs: 4.9 10*3/uL (ref 1.7–7.7)
Neutrophils Relative %: 62 %
Platelet Count: 229 10*3/uL (ref 150–400)
RBC: 4.42 MIL/uL (ref 4.22–5.81)
RDW: 12.1 % (ref 11.5–15.5)
WBC Count: 7.9 10*3/uL (ref 4.0–10.5)
nRBC: 0 % (ref 0.0–0.2)

## 2023-03-22 LAB — IRON AND IRON BINDING CAPACITY (CC-WL,HP ONLY)
Iron: 237 ug/dL — ABNORMAL HIGH (ref 45–182)
Saturation Ratios: 97 % — ABNORMAL HIGH (ref 17.9–39.5)
TIBC: 244 ug/dL — ABNORMAL LOW (ref 250–450)
UIBC: 7 ug/dL — ABNORMAL LOW (ref 117–376)

## 2023-03-22 LAB — FERRITIN: Ferritin: 228 ng/mL (ref 24–336)

## 2023-03-28 ENCOUNTER — Telehealth: Payer: Self-pay

## 2023-03-28 ENCOUNTER — Ambulatory Visit: Payer: Medicare PPO | Admitting: Family Medicine

## 2023-03-28 ENCOUNTER — Telehealth: Payer: Self-pay | Admitting: Family Medicine

## 2023-03-28 NOTE — Telephone Encounter (Signed)
Pt missed HTN f/u appt today. He should be on amlodipine 5mg  daily. Plz call for update on how he's tolerating new medicine, ensure no pedal edema, see how home BP readings are running on new med.

## 2023-03-28 NOTE — Telephone Encounter (Signed)
Pt called and informed of the below message from Dr. [name]. Pt informed that a scheduler should be reaching out shortly regarding phlebotomy appt. Pt verbalized understanding.

## 2023-03-28 NOTE — Telephone Encounter (Signed)
-----   Message from Ulysees Barns IV sent at 03/28/2023  4:28 PM EDT ----- Please let Mr. Brawley know that his ferritin levels are above target.  They are currently at 228.  Once above 150 we have to restart phlebotomies.  I have reached out to our schedulers to have phlebotomies started next week.  We will continue to perform the vitamins until his ferritin is less than 50. ----- Message ----- From: Interface, [facility] [place] Sent: 03/22/2023  10:41 AM EDT To: Jaci Standard, MD

## 2023-03-29 ENCOUNTER — Encounter: Payer: Self-pay | Admitting: Family Medicine

## 2023-03-29 NOTE — Telephone Encounter (Signed)
Spoke relaying Dr Timoteo Expose message. Pt apologizes for missing appt, got days mixed up. However, he r/s on 04/03/23 at 11:00. Pt confirms he is taking amlodipine daily and is tolerating it well, no issues. States legs still swell occasionally. BP readings have been 140s/70s.

## 2023-03-31 ENCOUNTER — Telehealth: Payer: Self-pay | Admitting: Hematology and Oncology

## 2023-04-03 ENCOUNTER — Encounter: Payer: Self-pay | Admitting: Family Medicine

## 2023-04-03 ENCOUNTER — Ambulatory Visit: Payer: Medicare PPO | Admitting: Family Medicine

## 2023-04-03 VITALS — BP 128/80 | HR 80 | Temp 98.6°F | Ht 68.75 in | Wt 201.2 lb

## 2023-04-03 DIAGNOSIS — I10 Essential (primary) hypertension: Secondary | ICD-10-CM

## 2023-04-03 DIAGNOSIS — L570 Actinic keratosis: Secondary | ICD-10-CM | POA: Diagnosis not present

## 2023-04-03 DIAGNOSIS — Z23 Encounter for immunization: Secondary | ICD-10-CM

## 2023-04-03 MED ORDER — AMLODIPINE BESYLATE 5 MG PO TABS: 5.0000 mg | ORAL_TABLET | Freq: Every day | ORAL | 3 refills | Status: DC

## 2023-04-03 NOTE — Assessment & Plan Note (Addendum)
Chronic, improved readings based on home log he brings.  Continue current regimen of amlodipine 5mg  daily.

## 2023-04-03 NOTE — Patient Instructions (Addendum)
Flu shot today  BP is doing better- continue amlodipine 5mg  daily.  Your goal blood pressure is <140/90. Work on low salt/sodium diet - goal <2 grams (2,000mg ) per day. Eat a diet high in fruits/vegetables and whole grains.  Look into mediterranean and DASH diet. Goal activity is 180min/wk of moderate intensity exercise.  This can be split into 30 minute chunks.  If you are not at this level, you can start with smaller 10-15 min increments and slowly build up activity. Look at www.heart.org for more resources

## 2023-04-03 NOTE — Assessment & Plan Note (Signed)
Re-refer to dermatology .

## 2023-04-03 NOTE — Progress Notes (Signed)
Ph: [phone] Fax: [phone]   Patient ID: Michael Vaughan, male    DOB: [birth year], 57 y.o.   MRN: [id number]  This visit was conducted in person.  BP 128/80 (BP Location: Right Arm, Cuff Size: Large)   Pulse 80   Temp 98.6 F (37 C) (Oral)   Ht 5' 8.75" (1.746 m)   Wt 201 lb 4 oz (91.3 kg)   SpO2 96%   BMI 29.94 kg/m   BP Readings from Last 3 Encounters:  04/03/23 128/80  12/26/22 (!) 156/94  12/21/22 (!) 174/96   CC: 3 mo HTN f/u visit  Subjective:   HPI: Michael Vaughan is a 57 y.o. male presenting on 04/03/2023 for Medical Management of Chronic Issues (Here for 3 mo HTN f/u.)   132/71 at home this morning.  HTN - Compliant with current antihypertensive regimen of amlodipine 5mg  daily. Does check blood pressures at home and brings log: 118-161/70-80s, HR 60-70s. Peak reading after BBQ. Averaging 130s/80s. No low blood pressure readings or symptoms of dizziness/syncope. Denies HA, vision changes, CP/tightness, SOB, leg swelling.   Also requests referral to dermatologist for possible AKs on skin of face - referred back in 12/2021 but states he was never contacted.     Relevant past medical, surgical, family and social history reviewed and updated as indicated. Interim medical history since our last visit reviewed. Allergies and medications reviewed and updated. Outpatient Medications Prior to Visit  Medication Sig Dispense Refill   pilocarpine (PILOCAR) 1 % ophthalmic solution Place 1 drop into the left eye 2 (two) times daily.      amLODipine (NORVASC) 5 MG tablet Take 1 tablet (5 mg total) by mouth daily. 30 tablet 6   No facility-administered medications prior to visit.     Per HPI unless specifically indicated in ROS section below Review of Systems  Objective:  BP 128/80 (BP Location: Right Arm, Cuff Size: Large)   Pulse 80   Temp 98.6 F (37 C) (Oral)   Ht 5' 8.75" (1.746 m)   Wt 201 lb 4 oz (91.3 kg)   SpO2 96%   BMI 29.94 kg/m   Wt Readings from  Last 3 Encounters:  04/03/23 201 lb 4 oz (91.3 kg)  12/26/22 196 lb 2 oz (89 kg)  12/22/22 201 lb (91.2 kg)      Physical Exam Vitals and nursing note reviewed.  Constitutional:      Appearance: Normal appearance. He is not ill-appearing.  HENT:     Head: Normocephalic and atraumatic.     Mouth/Throat:     Mouth: Mucous membranes are moist.     Pharynx: Oropharynx is clear. No oropharyngeal exudate or posterior oropharyngeal erythema.  Eyes:     Extraocular Movements: Extraocular movements intact.     Conjunctiva/sclera: Conjunctivae normal.     Pupils: Pupils are equal, round, and reactive to light.  Cardiovascular:     Rate and Rhythm: Normal rate and regular rhythm.     Pulses: Normal pulses.     Heart sounds: Normal heart sounds. No murmur heard. Pulmonary:     Effort: Pulmonary effort is normal. No respiratory distress.     Breath sounds: Normal breath sounds. No wheezing, rhonchi or rales.  Musculoskeletal:     Cervical back: Normal range of motion and neck supple.     Right lower leg: No edema.     Left lower leg: No edema.  Skin:    General: Skin is warm and dry.  Findings: No rash.  Neurological:     Mental Status: He is alert.  Psychiatric:        Mood and Affect: Mood normal.        Behavior: Behavior normal.       Results for orders placed or performed in visit on 03/22/23  Retic Panel  Result Value Ref Range   Retic Ct Pct 1.6 0.4 - 3.1 %   RBC. 4.37 4.22 - 5.81 MIL/uL   Retic Count, Absolute 69.0 19.0 - 186.0 K/uL   Immature Retic Fract 12.2 2.3 - 15.9 %   Reticulocyte Hemoglobin 38.3 >27.9 pg  Iron and Iron Binding Capacity ([facility] only)  Result Value Ref Range   Iron 237 (H) 45 - 182 ug/dL   TIBC 161 (L) 096 - 045 ug/dL   Saturation Ratios 97 (H) 17.9 - 39.5 %   UIBC 7 (L) 117 - 376 ug/dL  Ferritin  Result Value Ref Range   Ferritin 228 24 - 336 ng/mL  CMP (Cancer Center only)  Result Value Ref Range   Sodium 142 135 - 145 mmol/L    Potassium 3.8 3.5 - 5.1 mmol/L   Chloride 108 98 - 111 mmol/L   CO2 28 22 - 32 mmol/L   Glucose, Bld 122 (H) 70 - 99 mg/dL   BUN 19 6 - 20 mg/dL   Creatinine 4.09 8.11 - 1.24 mg/dL   Calcium 9.5 8.9 - 91.4 mg/dL   Total Protein 6.7 6.5 - 8.1 g/dL   Albumin 4.3 3.5 - 5.0 g/dL   AST 27 15 - 41 U/L   ALT 33 0 - 44 U/L   Alkaline Phosphatase 64 38 - 126 U/L   Total Bilirubin 2.2 (H) 0.3 - 1.2 mg/dL   GFR, Estimated >78 >29 mL/min   Anion gap 6 5 - 15  CBC with Differential (Cancer Center Only)  Result Value Ref Range   WBC Count 7.9 4.0 - 10.5 K/uL   RBC 4.42 4.22 - 5.81 MIL/uL   Hemoglobin 15.6 13.0 - 17.0 g/dL   HCT 56.2 13.0 - 86.5 %   MCV 97.7 80.0 - 100.0 fL   MCH 35.3 (H) 26.0 - 34.0 pg   MCHC 36.1 (H) 30.0 - 36.0 g/dL   RDW 78.4 69.6 - 29.5 %   Platelet Count 229 150 - 400 K/uL   nRBC 0.0 0.0 - 0.2 %   Neutrophils Relative % 62 %   Neutro Abs 4.9 1.7 - 7.7 K/uL   Lymphocytes Relative 26 %   Lymphs Abs 2.1 0.7 - 4.0 K/uL   Monocytes Relative 7 %   Monocytes Absolute 0.5 0.1 - 1.0 K/uL   Eosinophils Relative 4 %   Eosinophils Absolute 0.3 0.0 - 0.5 K/uL   Basophils Relative 1 %   Basophils Absolute 0.1 0.0 - 0.1 K/uL   Immature Granulocytes 0 %   Abs Immature Granulocytes 0.01 0.00 - 0.07 K/uL    Assessment & Plan:   Problem List Items Addressed This Visit     AK (actinic keratosis)    Re-refer to dermatology .      Relevant Orders   Ambulatory referral to Dermatology   Hypertension - Primary    Chronic, improved readings based on home log he brings.  Continue current regimen of amlodipine 5mg  daily.       Relevant Medications   amLODipine (NORVASC) 5 MG tablet   Other Visit Diagnoses     Encounter for immunization  Relevant Orders   Flu vaccine trivalent PF, 6mos and older(Flulaval,Afluria,Fluarix,Fluzone) (Completed)        Meds ordered this encounter  Medications   amLODipine (NORVASC) 5 MG tablet    Sig: Take 1 tablet (5 mg total) by  mouth daily.    Dispense:  90 tablet    Refill:  3    Orders Placed This Encounter  Procedures   Flu vaccine trivalent PF, 6mos and older(Flulaval,Afluria,Fluarix,Fluzone)   Ambulatory referral to Dermatology    Referral Priority:   Routine    Referral Type:   Consultation    Referral Reason:   Specialty Services Required    Requested Specialty:   Dermatology    Number of Visits Requested:   1    Patient Instructions  Flu shot today  BP is doing better- continue amlodipine 5mg  daily.  Your goal blood pressure is <140/90. Work on low salt/sodium diet - goal <2 grams (2,000mg ) per day. Eat a diet high in fruits/vegetables and whole grains.  Look into mediterranean and DASH diet. Goal activity is 113min/wk of moderate intensity exercise.  This can be split into 30 minute chunks.  If you are not at this level, you can start with smaller 10-15 min increments and slowly build up activity. Look at www.heart.org for more resources   Follow up plan: Return in about 9 months (around 01/01/2024) for annual exam, prior fasting for blood work.  Eustaquio Boyden, MD

## 2023-04-05 ENCOUNTER — Inpatient Hospital Stay: Payer: Medicare PPO

## 2023-04-05 ENCOUNTER — Other Ambulatory Visit: Payer: Self-pay

## 2023-04-05 LAB — CMP (CANCER CENTER ONLY)
ALT: 29 U/L (ref 0–44)
AST: 22 U/L (ref 15–41)
Albumin: 4.2 g/dL (ref 3.5–5.0)
Alkaline Phosphatase: 74 U/L (ref 38–126)
Anion gap: 6 (ref 5–15)
BUN: 12 mg/dL (ref 6–20)
CO2: 28 mmol/L (ref 22–32)
Calcium: 9.5 mg/dL (ref 8.9–10.3)
Chloride: 106 mmol/L (ref 98–111)
Creatinine: 1.15 mg/dL (ref 0.61–1.24)
GFR, Estimated: >60 mL/min (ref >60)
Glucose, Bld: 149 mg/dL — ABNORMAL HIGH (ref 70–99)
Potassium: 3.9 mmol/L (ref 3.5–5.1)
Sodium: 140 mmol/L (ref 135–145)
Total Bilirubin: 1.5 mg/dL — ABNORMAL HIGH (ref 0.3–1.2)
Total Protein: 6.7 g/dL (ref 6.5–8.1)

## 2023-04-05 LAB — CBC WITH DIFFERENTIAL (CANCER CENTER ONLY)
Abs Immature Granulocytes: 0.03 10*3/uL (ref 0.00–0.07)
Basophils Absolute: 0.1 10*3/uL (ref 0.0–0.1)
Basophils Relative: 1 %
Eosinophils Absolute: 0.2 10*3/uL (ref 0.0–0.5)
Eosinophils Relative: 3 %
HCT: 42.7 % (ref 39.0–52.0)
Hemoglobin: 15.5 g/dL (ref 13.0–17.0)
Immature Granulocytes: 0 %
Lymphocytes Relative: 26 %
Lymphs Abs: 2.2 10*3/uL (ref 0.7–4.0)
MCH: 35.3 pg — ABNORMAL HIGH (ref 26.0–34.0)
MCHC: 36.3 g/dL — ABNORMAL HIGH (ref 30.0–36.0)
MCV: 97.3 fL (ref 80.0–100.0)
Monocytes Absolute: 0.5 10*3/uL (ref 0.1–1.0)
Monocytes Relative: 6 %
Neutro Abs: 5.4 10*3/uL (ref 1.7–7.7)
Neutrophils Relative %: 64 %
Platelet Count: 253 10*3/uL (ref 150–400)
RBC: 4.39 MIL/uL (ref 4.22–5.81)
RDW: 12.5 % (ref 11.5–15.5)
WBC Count: 8.3 10*3/uL (ref 4.0–10.5)
nRBC: 0 % (ref 0.0–0.2)

## 2023-04-05 LAB — IRON AND IRON BINDING CAPACITY (CC-WL,HP ONLY)
Iron: 218 ug/dL — ABNORMAL HIGH (ref 45–182)
Saturation Ratios: 93 % — ABNORMAL HIGH (ref 17.9–39.5)
TIBC: 234 ug/dL — ABNORMAL LOW (ref 250–450)
UIBC: 16 ug/dL — ABNORMAL LOW (ref 117–376)

## 2023-04-05 NOTE — Progress Notes (Signed)
Michael Vaughan presents today for phlebotomy per MD orders. Phlebotomy procedure started at 1535 and ended at 1552. 16ga phlebotomy kit was used to the R AC. 499 grams removed. IV needle removed intact. Patient observed for 30 minutes after procedure without any incident. Patient tolerated procedure well. Pt declined snack and beverage. Patient discharged to lobby ambulatory, with no complaints. VSS at discharge.

## 2023-04-05 NOTE — Patient Instructions (Signed)

## 2023-04-06 LAB — FERRITIN: Ferritin: 154 ng/mL (ref 24–336)

## 2023-04-19 ENCOUNTER — Inpatient Hospital Stay: Payer: Medicare PPO

## 2023-04-19 ENCOUNTER — Other Ambulatory Visit: Payer: Self-pay | Admitting: Hematology and Oncology

## 2023-04-19 ENCOUNTER — Inpatient Hospital Stay: Payer: Medicare PPO | Attending: Hematology and Oncology

## 2023-04-19 LAB — CBC WITH DIFFERENTIAL (CANCER CENTER ONLY)
Abs Immature Granulocytes: 0.03 10*3/uL (ref 0.00–0.07)
Basophils Absolute: 0.1 10*3/uL (ref 0.0–0.1)
Basophils Relative: 1 %
Eosinophils Absolute: 0.3 10*3/uL (ref 0.0–0.5)
Eosinophils Relative: 3 %
HCT: 42.7 % (ref 39.0–52.0)
Hemoglobin: 16 g/dL (ref 13.0–17.0)
Immature Granulocytes: 0 %
Lymphocytes Relative: 29 %
Lymphs Abs: 2.5 10*3/uL (ref 0.7–4.0)
MCH: 36.2 pg — ABNORMAL HIGH (ref 26.0–34.0)
MCHC: 37.5 g/dL — ABNORMAL HIGH (ref 30.0–36.0)
MCV: 96.6 fL (ref 80.0–100.0)
Monocytes Absolute: 0.5 10*3/uL (ref 0.1–1.0)
Monocytes Relative: 6 %
Neutro Abs: 5.4 10*3/uL (ref 1.7–7.7)
Neutrophils Relative %: 61 %
Platelet Count: 248 10*3/uL (ref 150–400)
RBC: 4.42 MIL/uL (ref 4.22–5.81)
RDW: 13.2 % (ref 11.5–15.5)
WBC Count: 8.7 10*3/uL (ref 4.0–10.5)
nRBC: 0 % (ref 0.0–0.2)

## 2023-04-19 LAB — CMP (CANCER CENTER ONLY)
ALT: 26 U/L (ref 0–44)
AST: 21 U/L (ref 15–41)
Albumin: 4.3 g/dL (ref 3.5–5.0)
Alkaline Phosphatase: 67 U/L (ref 38–126)
Anion gap: 6 (ref 5–15)
BUN: 14 mg/dL (ref 6–20)
CO2: 27 mmol/L (ref 22–32)
Calcium: 9.5 mg/dL (ref 8.9–10.3)
Chloride: 106 mmol/L (ref 98–111)
Creatinine: 0.83 mg/dL (ref 0.61–1.24)
GFR, Estimated: >60 mL/min (ref >60)
Glucose, Bld: 161 mg/dL — ABNORMAL HIGH (ref 70–99)
Potassium: 3.9 mmol/L (ref 3.5–5.1)
Sodium: 139 mmol/L (ref 135–145)
Total Bilirubin: 1.4 mg/dL — ABNORMAL HIGH (ref <1.2)
Total Protein: 6.7 g/dL (ref 6.5–8.1)

## 2023-04-19 LAB — IRON AND IRON BINDING CAPACITY (CC-WL,HP ONLY)
Iron: 218 ug/dL — ABNORMAL HIGH (ref 45–182)
Saturation Ratios: 84 % — ABNORMAL HIGH (ref 17.9–39.5)
TIBC: 259 ug/dL (ref 250–450)
UIBC: 41 ug/dL — ABNORMAL LOW (ref 117–376)

## 2023-04-19 NOTE — Patient Instructions (Signed)

## 2023-04-19 NOTE — Progress Notes (Signed)
  Michael Vaughan presents today for phlebotomy per MD orders. Phlebotomy procedure started at 1525 and ended at 1537. 16ga phlebotomy kit was used to the R AC. 510  grams removed. IV needle removed intact. Patient observed for 30 minutes after procedure without any incident. Patient tolerated procedure well. Pt declined snack and beverage. Patient discharged to lobby ambulatory, with no complaints. VSS at discharge.

## 2023-04-20 LAB — FERRITIN: Ferritin: 97 ng/mL (ref 24–336)

## 2023-05-03 ENCOUNTER — Inpatient Hospital Stay: Payer: Medicare PPO

## 2023-05-03 ENCOUNTER — Other Ambulatory Visit: Payer: Self-pay | Admitting: Hematology and Oncology

## 2023-05-03 ENCOUNTER — Other Ambulatory Visit: Payer: Self-pay

## 2023-05-03 LAB — CBC WITH DIFFERENTIAL (CANCER CENTER ONLY)
Abs Immature Granulocytes: 0.02 10*3/uL (ref 0.00–0.07)
Basophils Absolute: 0.1 10*3/uL (ref 0.0–0.1)
Basophils Relative: 1 %
Eosinophils Absolute: 0.2 10*3/uL (ref 0.0–0.5)
Eosinophils Relative: 3 %
HCT: 44.2 % (ref 39.0–52.0)
Hemoglobin: 16 g/dL (ref 13.0–17.0)
Immature Granulocytes: 0 %
Lymphocytes Relative: 26 %
Lymphs Abs: 2.2 10*3/uL (ref 0.7–4.0)
MCH: 36 pg — ABNORMAL HIGH (ref 26.0–34.0)
MCHC: 36.2 g/dL — ABNORMAL HIGH (ref 30.0–36.0)
MCV: 99.5 fL (ref 80.0–100.0)
Monocytes Absolute: 0.6 10*3/uL (ref 0.1–1.0)
Monocytes Relative: 7 %
Neutro Abs: 5.2 10*3/uL (ref 1.7–7.7)
Neutrophils Relative %: 63 %
Platelet Count: 256 10*3/uL (ref 150–400)
RBC: 4.44 MIL/uL (ref 4.22–5.81)
RDW: 13 % (ref 11.5–15.5)
WBC Count: 8.3 10*3/uL (ref 4.0–10.5)
nRBC: 0 % (ref 0.0–0.2)

## 2023-05-03 LAB — IRON AND IRON BINDING CAPACITY (CC-WL,HP ONLY)
Iron: 229 ug/dL — ABNORMAL HIGH (ref 45–182)
Saturation Ratios: 87 % — ABNORMAL HIGH (ref 17.9–39.5)
TIBC: 263 ug/dL (ref 250–450)
UIBC: 34 ug/dL — ABNORMAL LOW (ref 117–376)

## 2023-05-03 LAB — CMP (CANCER CENTER ONLY)
ALT: 25 U/L (ref 0–44)
AST: 21 U/L (ref 15–41)
Albumin: 4.4 g/dL (ref 3.5–5.0)
Alkaline Phosphatase: 69 U/L (ref 38–126)
Anion gap: 6 (ref 5–15)
BUN: 13 mg/dL (ref 6–20)
CO2: 26 mmol/L (ref 22–32)
Calcium: 9.2 mg/dL (ref 8.9–10.3)
Chloride: 107 mmol/L (ref 98–111)
Creatinine: 0.82 mg/dL (ref 0.61–1.24)
GFR, Estimated: >60 mL/min (ref >60)
Glucose, Bld: 107 mg/dL — ABNORMAL HIGH (ref 70–99)
Potassium: 3.9 mmol/L (ref 3.5–5.1)
Sodium: 139 mmol/L (ref 135–145)
Total Bilirubin: 1.7 mg/dL — ABNORMAL HIGH (ref <1.2)
Total Protein: 6.8 g/dL (ref 6.5–8.1)

## 2023-05-03 NOTE — Patient Instructions (Signed)

## 2023-05-03 NOTE — Progress Notes (Signed)
Michael Vaughan presents today for phlebotomy per MD orders. Phlebotomy procedure started at 1536 and ended at 1543. 502 grams removed. Patient observed for 30 minutes after procedure without any incident. Patient tolerated procedure well. 16g phlebotomy kit used to R AC. IV needle removed intact. Patient declined food or beverage. VSS at discharge, ambulatory to lobby.

## 2023-05-04 LAB — FERRITIN: Ferritin: 64 ng/mL (ref 24–336)

## 2023-05-17 ENCOUNTER — Inpatient Hospital Stay: Payer: Medicare PPO

## 2023-05-17 ENCOUNTER — Inpatient Hospital Stay: Payer: Medicare PPO | Attending: Hematology and Oncology

## 2023-05-17 ENCOUNTER — Other Ambulatory Visit: Payer: Self-pay | Admitting: *Deleted

## 2023-05-17 LAB — CBC WITH DIFFERENTIAL (CANCER CENTER ONLY)
Abs Immature Granulocytes: 0.03 10*3/uL (ref 0.00–0.07)
Basophils Absolute: 0.1 10*3/uL (ref 0.0–0.1)
Basophils Relative: 1 %
Eosinophils Absolute: 0.2 10*3/uL (ref 0.0–0.5)
Eosinophils Relative: 2 %
HCT: 45.5 % (ref 39.0–52.0)
Hemoglobin: 16.3 g/dL (ref 13.0–17.0)
Immature Granulocytes: 0 %
Lymphocytes Relative: 25 %
Lymphs Abs: 2 10*3/uL (ref 0.7–4.0)
MCH: 35.1 pg — ABNORMAL HIGH (ref 26.0–34.0)
MCHC: 35.8 g/dL (ref 30.0–36.0)
MCV: 98.1 fL (ref 80.0–100.0)
Monocytes Absolute: 0.6 10*3/uL (ref 0.1–1.0)
Monocytes Relative: 7 %
Neutro Abs: 5.4 10*3/uL (ref 1.7–7.7)
Neutrophils Relative %: 65 %
Platelet Count: 285 10*3/uL (ref 150–400)
RBC: 4.64 MIL/uL (ref 4.22–5.81)
RDW: 12.7 % (ref 11.5–15.5)
WBC Count: 8.2 10*3/uL (ref 4.0–10.5)
nRBC: 0 % (ref 0.0–0.2)

## 2023-05-17 LAB — CMP (CANCER CENTER ONLY)
ALT: 33 U/L (ref 0–44)
AST: 27 U/L (ref 15–41)
Albumin: 4.4 g/dL (ref 3.5–5.0)
Alkaline Phosphatase: 59 U/L (ref 38–126)
Anion gap: 6 (ref 5–15)
BUN: 17 mg/dL (ref 6–20)
CO2: 26 mmol/L (ref 22–32)
Calcium: 9.5 mg/dL (ref 8.9–10.3)
Chloride: 106 mmol/L (ref 98–111)
Creatinine: 0.81 mg/dL (ref 0.61–1.24)
GFR, Estimated: >60 mL/min (ref >60)
Glucose, Bld: 98 mg/dL (ref 70–99)
Potassium: 4.1 mmol/L (ref 3.5–5.1)
Sodium: 138 mmol/L (ref 135–145)
Total Bilirubin: 1.3 mg/dL — ABNORMAL HIGH (ref <1.2)
Total Protein: 6.8 g/dL (ref 6.5–8.1)

## 2023-05-17 LAB — IRON AND IRON BINDING CAPACITY (CC-WL,HP ONLY)
Iron: 144 ug/dL (ref 45–182)
Saturation Ratios: 47 % — ABNORMAL HIGH (ref 17.9–39.5)
TIBC: 307 ug/dL (ref 250–450)
UIBC: 163 ug/dL (ref 117–376)

## 2023-05-17 NOTE — Progress Notes (Signed)
Pt presented today for phlebotomy per MD order. Pt vital signs stable, however pt stated he wasn't feeling well after his last phlebotomy and that having them every 2 weeks were "wiping me out." Dr. [name] notified. Orders given to cancel next 2 phlebotomies and to see Dr. [name] back at his scheduled appointment in January. Pt ambulated to lobby in stable condition.

## 2023-05-18 LAB — FERRITIN: Ferritin: 42 ng/mL (ref 24–336)

## 2023-05-31 ENCOUNTER — Inpatient Hospital Stay: Payer: Medicare PPO

## 2023-05-31 ENCOUNTER — Other Ambulatory Visit: Payer: Medicare PPO

## 2023-06-12 ENCOUNTER — Inpatient Hospital Stay: Payer: Medicare PPO

## 2023-06-21 ENCOUNTER — Inpatient Hospital Stay: Payer: Medicare PPO | Attending: Hematology and Oncology

## 2023-06-21 ENCOUNTER — Other Ambulatory Visit: Payer: Self-pay | Admitting: Hematology and Oncology

## 2023-06-21 DIAGNOSIS — H3552 Pigmentary retinal dystrophy: Secondary | ICD-10-CM | POA: Diagnosis not present

## 2023-06-21 DIAGNOSIS — R5383 Other fatigue: Secondary | ICD-10-CM | POA: Insufficient documentation

## 2023-06-21 DIAGNOSIS — R519 Headache, unspecified: Secondary | ICD-10-CM | POA: Diagnosis not present

## 2023-06-21 LAB — CBC WITH DIFFERENTIAL (CANCER CENTER ONLY)
Abs Immature Granulocytes: 0.01 10*3/uL (ref 0.00–0.07)
Basophils Absolute: 0.1 10*3/uL (ref 0.0–0.1)
Basophils Relative: 1 %
Eosinophils Absolute: 0.3 10*3/uL (ref 0.0–0.5)
Eosinophils Relative: 4 %
HCT: 45.8 % (ref 39.0–52.0)
Hemoglobin: 16.5 g/dL (ref 13.0–17.0)
Immature Granulocytes: 0 %
Lymphocytes Relative: 30 %
Lymphs Abs: 2.3 10*3/uL (ref 0.7–4.0)
MCH: 34.4 pg — ABNORMAL HIGH (ref 26.0–34.0)
MCHC: 36 g/dL (ref 30.0–36.0)
MCV: 95.4 fL (ref 80.0–100.0)
Monocytes Absolute: 0.5 10*3/uL (ref 0.1–1.0)
Monocytes Relative: 7 %
Neutro Abs: 4.5 10*3/uL (ref 1.7–7.7)
Neutrophils Relative %: 58 %
Platelet Count: 253 10*3/uL (ref 150–400)
RBC: 4.8 MIL/uL (ref 4.22–5.81)
RDW: 12 % (ref 11.5–15.5)
WBC Count: 7.6 10*3/uL (ref 4.0–10.5)
nRBC: 0 % (ref 0.0–0.2)

## 2023-06-21 LAB — CMP (CANCER CENTER ONLY)
ALT: 22 U/L (ref 0–44)
AST: 20 U/L (ref 15–41)
Albumin: 4.1 g/dL (ref 3.5–5.0)
Alkaline Phosphatase: 67 U/L (ref 38–126)
Anion gap: 7 (ref 5–15)
BUN: 12 mg/dL (ref 6–20)
CO2: 26 mmol/L (ref 22–32)
Calcium: 9.6 mg/dL (ref 8.9–10.3)
Chloride: 108 mmol/L (ref 98–111)
Creatinine: 0.83 mg/dL (ref 0.61–1.24)
GFR, Estimated: >60 mL/min (ref >60)
Glucose, Bld: 149 mg/dL — ABNORMAL HIGH (ref 70–99)
Potassium: 3.9 mmol/L (ref 3.5–5.1)
Sodium: 141 mmol/L (ref 135–145)
Total Bilirubin: 1.3 mg/dL — ABNORMAL HIGH (ref 0.0–1.2)
Total Protein: 6.4 g/dL — ABNORMAL LOW (ref 6.5–8.1)

## 2023-06-21 LAB — IRON AND IRON BINDING CAPACITY (CC-WL,HP ONLY)
Iron: 192 ug/dL — ABNORMAL HIGH (ref 45–182)
Saturation Ratios: 75 % — ABNORMAL HIGH (ref 17.9–39.5)
TIBC: 258 ug/dL (ref 250–450)
UIBC: 66 ug/dL — ABNORMAL LOW (ref 117–376)

## 2023-06-21 LAB — FERRITIN: Ferritin: 30 ng/mL (ref 24–336)

## 2023-06-27 NOTE — Progress Notes (Signed)
 Patient Care Team: Claire Crick, MD as PCP - General (Family Medicine) Rexene Catching, MD as Consulting Physician (Ophthalmology) Asencion Blacksmith, MD (Inactive) as Consulting Physician (Gastroenterology)   CHIEF COMPLAINT: Follow-up hereditary hemochromatosis  CURRENT THERAPY: Observation  INTERVAL HISTORY Michael Vaughan returns for follow up as scheduled. Last seen by Dr. Rosaline Coma 12/21/22. He resumed phlebotomies in October - December q2 weeks, he tolerated well except fatigued for 2 days after. He feels better now. Had a headache the other day for the first time in a year. Otherwise doing well without complaints.   ROS  All other systems reviewed and negative  Past Medical History:  Diagnosis Date   Allergy    Arthralgia    takes aleve regularly   Arthritis    Cataract    Heartburn    Hereditary hemochromatosis (HCC) 05/2013   iron overload with mild HM, homozygous for C282Y, monthly blood draws   History of chicken pox    History of migraine 1990s   remote   Retinitis pigmentosa      Past Surgical History:  Procedure Laterality Date   CATARACT EXTRACTION BILATERAL W/ ANTERIOR VITRECTOMY Bilateral 2001   COLONOSCOPY  12/2016   WNL ([facility])   INGUINAL HERNIA REPAIR  1972   at age 62   IRIDECTOMY Right 01/14/2014   Procedure: IRIDECTOMY;  Surgeon: Rexene Catching, MD;  Location: [facility] OR;  Service: Ophthalmology;  Laterality: Right;   PARS PLANA VITRECTOMY  01/24/2012   Procedure: PARS PLANA VITRECTOMY WITH 25G REMOVAL/SUTURE INTRAOCULAR LENS;  Surgeon: Rexene Catching, MD;  Location: [facility] OR;  Service: Ophthalmology;  Laterality: Left;   PARS PLANA VITRECTOMY Right 01/14/2014   dislocated intraocular lens - PARS PLANA VITRECTOMY WITH 25G REMOVAL/SUTURE SECONDARY INTRAOCULAR LENS;  Rexene Catching, MD   PHOTOCOAGULATION WITH LASER Right 01/14/2014   Procedure: PHOTOCOAGULATION WITH LASER;  Surgeon: Rexene Catching, MD;  Location: [facility] OR;  Service: Ophthalmology;  Laterality:  Right;   TOTAL HIP ARTHROPLASTY Right 04/27/2021   Procedure: TOTAL HIP ARTHROPLASTY ANTERIOR APPROACH;  Surgeon: Claiborne Crew, MD;  Location: [facility] ORS;  Service: Orthopedics;  Laterality: Right;     Outpatient Encounter Medications as of 06/28/2023  Medication Sig   amLODipine  (NORVASC ) 5 MG tablet Take 1 tablet (5 mg total) by mouth daily.   pilocarpine  (PILOCAR) 1 % ophthalmic solution Place 1 drop into the left eye 2 (two) times daily.    No facility-administered encounter medications on file as of 06/28/2023.     Today's Vitals   06/28/23 1006 06/28/23 1015  BP: 126/72   Pulse: 68   Resp: 16   Temp: 97.6 F (36.4 C)   TempSrc: Temporal   SpO2: 98%   Weight: 200 lb 11.2 oz (91 kg)   Height: 5' 8.75" (1.746 m)   PainSc:  0-No pain   Body mass index is 29.85 kg/m.   PHYSICAL EXAM GENERAL:alert, no distress and comfortable SKIN: no rash  EYES: sclera clear NECK: without mass LYMPH:  no palpable cervical or supraclavicular lymphadenopathy  LUNGS: clear with normal breathing effort HEART: regular rate & rhythm, no lower extremity edema ABDOMEN: abdomen slightly firm, non-tender and normal bowel sounds NEURO: alert & oriented x 3 with fluent speech, no focal motor/sensory deficits   CBC    Component Value Date/Time   WBC 7.6 06/21/2023 1025   WBC 15.2 (H) 04/28/2021 0321   RBC 4.80 06/21/2023 1025   HGB 16.5 06/21/2023 1025   HCT 45.8  06/21/2023 1025   PLT 253 06/21/2023 1025   MCV 95.4 06/21/2023 1025   MCH 34.4 (H) 06/21/2023 1025   MCHC 36.0 06/21/2023 1025   RDW 12.0 06/21/2023 1025   LYMPHSABS 2.3 06/21/2023 1025   MONOABS 0.5 06/21/2023 1025   EOSABS 0.3 06/21/2023 1025   BASOSABS 0.1 06/21/2023 1025     CMP     Component Value Date/Time   NA 141 06/21/2023 1035   K 3.9 06/21/2023 1035   CL 108 06/21/2023 1035   CO2 26 06/21/2023 1035   GLUCOSE 149 (H) 06/21/2023 1035   BUN 12 06/21/2023 1035   CREATININE 0.83 06/21/2023 1035   CALCIUM 9.6  06/21/2023 1035   PROT 6.4 (L) 06/21/2023 1035   ALBUMIN 4.1 06/21/2023 1035   AST 20 06/21/2023 1035   ALT 22 06/21/2023 1035   ALKPHOS 67 06/21/2023 1035   BILITOT 1.3 (H) 06/21/2023 1035   GFRNONAA >60 06/21/2023 1035     ASSESSMENT & PLAN: 58 yo male   Hereditary hemochromatosis, homozygous C282Y mutation  -Diagnosed 06/27/2013 with HFE testing showing homozygous for C282Y, ferritin >1500 -S/p phlebotomy program 06/19/13- 04/09/2018, Ferritin decreased to 41.7 on 05/09/2018.  -Ferritin increased to 154 in 03/2023, restarted phlebotomy q2 weeks from 04/05/23 - 05/17/23, tolerated well with fatigue after procedure -Michael Vaughan appears well.  PLAN: Recent labs reviewed, Ferritin 30, no need to restart phlebotomy at this time -Continue observation, lab in 3 months, no need for phlebotomy as long as ferritin <150 -F/up in 6 months, or sooner if needed   Orders Placed This Encounter  Procedures   CBC with Differential ([facility] Only)    Standing Status:   Future    Expected Date:   09/26/2023    Expiration Date:   06/27/2024   CMP (Cancer Center only)    Standing Status:   Future    Expected Date:   09/26/2023    Expiration Date:   06/27/2024   Ferritin    Standing Status:   Future    Expected Date:   09/26/2023    Expiration Date:   06/27/2024   Iron and Iron Binding Capacity ([facility] only)    Standing Status:   Future    Expected Date:   09/26/2023    Expiration Date:   06/27/2024      All questions were answered. The patient knows to call the clinic with any problems, questions or concerns. No barriers to learning were detected.   Michael Even, NP-C 06/28/2023

## 2023-06-28 ENCOUNTER — Inpatient Hospital Stay: Payer: Medicare PPO

## 2023-06-28 ENCOUNTER — Encounter: Payer: Self-pay | Admitting: Nurse Practitioner

## 2023-06-28 ENCOUNTER — Inpatient Hospital Stay: Payer: Medicare PPO | Admitting: Nurse Practitioner

## 2023-06-28 DIAGNOSIS — R5383 Other fatigue: Secondary | ICD-10-CM | POA: Diagnosis not present

## 2023-06-28 DIAGNOSIS — R519 Headache, unspecified: Secondary | ICD-10-CM | POA: Diagnosis not present

## 2023-06-28 DIAGNOSIS — H3552 Pigmentary retinal dystrophy: Secondary | ICD-10-CM | POA: Diagnosis not present

## 2023-07-26 ENCOUNTER — Encounter (INDEPENDENT_AMBULATORY_CARE_PROVIDER_SITE_OTHER): Payer: Medicare PPO | Admitting: Ophthalmology

## 2023-07-26 DIAGNOSIS — H35033 Hypertensive retinopathy, bilateral: Secondary | ICD-10-CM | POA: Diagnosis not present

## 2023-07-26 DIAGNOSIS — H3552 Pigmentary retinal dystrophy: Secondary | ICD-10-CM | POA: Diagnosis not present

## 2023-07-26 DIAGNOSIS — I10 Essential (primary) hypertension: Secondary | ICD-10-CM | POA: Diagnosis not present

## 2023-09-26 ENCOUNTER — Inpatient Hospital Stay: Payer: Medicare PPO | Attending: Hematology and Oncology

## 2023-09-26 LAB — CMP (CANCER CENTER ONLY)
ALT: 22 U/L (ref 0–44)
AST: 21 U/L (ref 15–41)
Albumin: 4.4 g/dL (ref 3.5–5.0)
Alkaline Phosphatase: 66 U/L (ref 38–126)
Anion gap: 4 — ABNORMAL LOW (ref 5–15)
BUN: 17 mg/dL (ref 6–20)
CO2: 25 mmol/L (ref 22–32)
Calcium: 9.2 mg/dL (ref 8.9–10.3)
Chloride: 110 mmol/L (ref 98–111)
Creatinine: 0.73 mg/dL (ref 0.61–1.24)
GFR, Estimated: >60 mL/min (ref >60)
Glucose, Bld: 96 mg/dL (ref 70–99)
Potassium: 4 mmol/L (ref 3.5–5.1)
Sodium: 139 mmol/L (ref 135–145)
Total Bilirubin: 2.6 mg/dL — ABNORMAL HIGH (ref 0.0–1.2)
Total Protein: 6.8 g/dL (ref 6.5–8.1)

## 2023-09-26 LAB — CBC WITH DIFFERENTIAL (CANCER CENTER ONLY)
Abs Immature Granulocytes: 0.02 10*3/uL (ref 0.00–0.07)
Basophils Absolute: 0.1 10*3/uL (ref 0.0–0.1)
Basophils Relative: 1 %
Eosinophils Absolute: 0.2 10*3/uL (ref 0.0–0.5)
Eosinophils Relative: 3 %
HCT: 44.1 % (ref 39.0–52.0)
Hemoglobin: 16.1 g/dL (ref 13.0–17.0)
Immature Granulocytes: 0 %
Lymphocytes Relative: 27 %
Lymphs Abs: 2 10*3/uL (ref 0.7–4.0)
MCH: 34.3 pg — ABNORMAL HIGH (ref 26.0–34.0)
MCHC: 36.5 g/dL — ABNORMAL HIGH (ref 30.0–36.0)
MCV: 93.8 fL (ref 80.0–100.0)
Monocytes Absolute: 0.6 10*3/uL (ref 0.1–1.0)
Monocytes Relative: 8 %
Neutro Abs: 4.4 10*3/uL (ref 1.7–7.7)
Neutrophils Relative %: 61 %
Platelet Count: 252 10*3/uL (ref 150–400)
RBC: 4.7 MIL/uL (ref 4.22–5.81)
RDW: 12.8 % (ref 11.5–15.5)
WBC Count: 7.3 10*3/uL (ref 4.0–10.5)
nRBC: 0 % (ref 0.0–0.2)

## 2023-09-26 LAB — IRON AND IRON BINDING CAPACITY (CC-WL,HP ONLY)
Iron: 249 ug/dL — ABNORMAL HIGH (ref 45–182)
Saturation Ratios: 96 % — ABNORMAL HIGH (ref 17.9–39.5)
TIBC: 260 ug/dL (ref 250–450)
UIBC: 11 ug/dL — ABNORMAL LOW (ref 117–376)

## 2023-09-26 LAB — FERRITIN: Ferritin: 84 ng/mL (ref 24–336)

## 2023-09-28 ENCOUNTER — Encounter: Payer: Self-pay | Admitting: Physician Assistant

## 2023-10-05 DIAGNOSIS — H3552 Pigmentary retinal dystrophy: Secondary | ICD-10-CM | POA: Diagnosis not present

## 2023-12-01 ENCOUNTER — Ambulatory Visit: Admitting: Primary Care

## 2023-12-01 ENCOUNTER — Other Ambulatory Visit: Payer: Self-pay

## 2023-12-01 ENCOUNTER — Encounter (HOSPITAL_BASED_OUTPATIENT_CLINIC_OR_DEPARTMENT_OTHER): Payer: Self-pay

## 2023-12-01 ENCOUNTER — Emergency Department (HOSPITAL_BASED_OUTPATIENT_CLINIC_OR_DEPARTMENT_OTHER): Admitting: Radiology

## 2023-12-01 ENCOUNTER — Ambulatory Visit: Payer: Self-pay

## 2023-12-01 ENCOUNTER — Inpatient Hospital Stay (HOSPITAL_BASED_OUTPATIENT_CLINIC_OR_DEPARTMENT_OTHER)
Admission: EM | Admit: 2023-12-01 | Discharge: 2023-12-03 | DRG: 871 | Disposition: A | Discharge status: Home / Self Care | Attending: Family Medicine | Admitting: Family Medicine

## 2023-12-01 DIAGNOSIS — A419 Sepsis, unspecified organism: Principal | ICD-10-CM | POA: Diagnosis present

## 2023-12-01 DIAGNOSIS — E222 Syndrome of inappropriate secretion of antidiuretic hormone: Secondary | ICD-10-CM | POA: Diagnosis present

## 2023-12-01 DIAGNOSIS — Z96641 Presence of right artificial hip joint: Secondary | ICD-10-CM | POA: Diagnosis present

## 2023-12-01 DIAGNOSIS — E876 Hypokalemia: Secondary | ICD-10-CM | POA: Diagnosis not present

## 2023-12-01 DIAGNOSIS — R6520 Severe sepsis without septic shock: Secondary | ICD-10-CM | POA: Diagnosis not present

## 2023-12-01 DIAGNOSIS — Z79899 Other long term (current) drug therapy: Secondary | ICD-10-CM

## 2023-12-01 DIAGNOSIS — Z8249 Family history of ischemic heart disease and other diseases of the circulatory system: Secondary | ICD-10-CM | POA: Diagnosis not present

## 2023-12-01 DIAGNOSIS — E66811 Obesity, class 1: Secondary | ICD-10-CM | POA: Diagnosis present

## 2023-12-01 DIAGNOSIS — I10 Essential (primary) hypertension: Secondary | ICD-10-CM | POA: Diagnosis present

## 2023-12-01 DIAGNOSIS — Z880 Allergy status to penicillin: Secondary | ICD-10-CM | POA: Diagnosis not present

## 2023-12-01 DIAGNOSIS — J168 Pneumonia due to other specified infectious organisms: Secondary | ICD-10-CM | POA: Diagnosis not present

## 2023-12-01 DIAGNOSIS — Z6830 Body mass index (BMI) 30.0-30.9, adult: Secondary | ICD-10-CM

## 2023-12-01 DIAGNOSIS — J9601 Acute respiratory failure with hypoxia: Secondary | ICD-10-CM | POA: Diagnosis present

## 2023-12-01 DIAGNOSIS — Z87891 Personal history of nicotine dependence: Secondary | ICD-10-CM | POA: Diagnosis not present

## 2023-12-01 DIAGNOSIS — K219 Gastro-esophageal reflux disease without esophagitis: Secondary | ICD-10-CM | POA: Diagnosis present

## 2023-12-01 DIAGNOSIS — J189 Pneumonia, unspecified organism: Principal | ICD-10-CM | POA: Diagnosis present

## 2023-12-01 DIAGNOSIS — Z823 Family history of stroke: Secondary | ICD-10-CM | POA: Diagnosis not present

## 2023-12-01 DIAGNOSIS — R918 Other nonspecific abnormal finding of lung field: Secondary | ICD-10-CM | POA: Diagnosis not present

## 2023-12-01 LAB — COMPREHENSIVE METABOLIC PANEL WITH GFR
ALT: 31 U/L (ref 0–44)
AST: 35 U/L (ref 15–41)
Albumin: 3.2 g/dL — ABNORMAL LOW (ref 3.5–5.0)
Alkaline Phosphatase: 58 U/L (ref 38–126)
Anion gap: 16 — ABNORMAL HIGH (ref 5–15)
BUN: 20 mg/dL (ref 6–20)
CO2: 20 mmol/L — ABNORMAL LOW (ref 22–32)
Calcium: 8.7 mg/dL — ABNORMAL LOW (ref 8.9–10.3)
Chloride: 90 mmol/L — ABNORMAL LOW (ref 98–111)
Creatinine, Ser: 0.93 mg/dL (ref 0.61–1.24)
GFR, Estimated: >60 mL/min (ref >60)
Glucose, Bld: 139 mg/dL — ABNORMAL HIGH (ref 70–99)
Potassium: 2.8 mmol/L — ABNORMAL LOW (ref 3.5–5.1)
Sodium: 126 mmol/L — ABNORMAL LOW (ref 135–145)
Total Bilirubin: 2.2 mg/dL — ABNORMAL HIGH (ref 0.0–1.2)
Total Protein: 6 g/dL — ABNORMAL LOW (ref 6.5–8.1)

## 2023-12-01 LAB — MAGNESIUM: Magnesium: 1.6 mg/dL — ABNORMAL LOW (ref 1.7–2.4)

## 2023-12-01 LAB — CBC WITH DIFFERENTIAL/PLATELET
Abs Immature Granulocytes: 0.22 10*3/uL — ABNORMAL HIGH (ref 0.00–0.07)
Basophils Absolute: 0 10*3/uL (ref 0.0–0.1)
Basophils Relative: 0 %
Eosinophils Absolute: 0 10*3/uL (ref 0.0–0.5)
Eosinophils Relative: 0 %
HCT: 47.8 % (ref 39.0–52.0)
Hemoglobin: 17.7 g/dL — ABNORMAL HIGH (ref 13.0–17.0)
Immature Granulocytes: 2 %
Lymphocytes Relative: 9 %
Lymphs Abs: 1.3 10*3/uL (ref 0.7–4.0)
MCH: 34.3 pg — ABNORMAL HIGH (ref 26.0–34.0)
MCHC: 37 g/dL — ABNORMAL HIGH (ref 30.0–36.0)
MCV: 92.6 fL (ref 80.0–100.0)
Monocytes Absolute: 0.8 10*3/uL (ref 0.1–1.0)
Monocytes Relative: 6 %
Neutro Abs: 12.2 10*3/uL — ABNORMAL HIGH (ref 1.7–7.7)
Neutrophils Relative %: 83 %
Platelets: 176 10*3/uL (ref 150–400)
RBC: 5.16 MIL/uL (ref 4.22–5.81)
RDW: 12.3 % (ref 11.5–15.5)
WBC: 9.7 10*3/uL (ref 4.0–10.5)
nRBC: 0 % (ref 0.0–0.2)

## 2023-12-01 LAB — URINALYSIS, W/ REFLEX TO CULTURE (INFECTION SUSPECTED)
Bacteria, UA: NONE SEEN
Bilirubin Urine: NEGATIVE
Glucose, UA: NEGATIVE mg/dL
Hgb urine dipstick: NEGATIVE
Ketones, ur: NEGATIVE mg/dL
Leukocytes,Ua: NEGATIVE
Nitrite: NEGATIVE
Protein, ur: NEGATIVE mg/dL
Specific Gravity, Urine: 1.009 (ref 1.005–1.030)
pH: 6 (ref 5.0–8.0)

## 2023-12-01 LAB — BASIC METABOLIC PANEL WITH GFR
Anion gap: 9 (ref 5–15)
BUN: 19 mg/dL (ref 6–20)
CO2: 24 mmol/L (ref 22–32)
Calcium: 8.2 mg/dL — ABNORMAL LOW (ref 8.9–10.3)
Chloride: 98 mmol/L (ref 98–111)
Creatinine, Ser: 0.9 mg/dL (ref 0.61–1.24)
GFR, Estimated: >60 mL/min (ref >60)
Glucose, Bld: 117 mg/dL — ABNORMAL HIGH (ref 70–99)
Potassium: 3.7 mmol/L (ref 3.5–5.1)
Sodium: 131 mmol/L — ABNORMAL LOW (ref 135–145)

## 2023-12-01 LAB — I-STAT VENOUS BLOOD GAS, ED
Acid-Base Excess: 2 mmol/L (ref 0.0–2.0)
Bicarbonate: 22.7 mmol/L (ref 20.0–28.0)
Calcium, Ion: 0.94 mmol/L — ABNORMAL LOW (ref 1.15–1.40)
HCT: 36 % — ABNORMAL LOW (ref 39.0–52.0)
Hemoglobin: 12.2 g/dL — ABNORMAL LOW (ref 13.0–17.0)
O2 Saturation: 100 %
Potassium: 4.6 mmol/L (ref 3.5–5.1)
Sodium: 127 mmol/L — ABNORMAL LOW (ref 135–145)
TCO2: 23 mmol/L (ref 22–32)
pCO2, Ven: 23.9 mmHg — ABNORMAL LOW (ref 44–60)
pH, Ven: 7.586 — ABNORMAL HIGH (ref 7.25–7.43)
pO2, Ven: 143 mmHg — ABNORMAL HIGH (ref 32–45)

## 2023-12-01 LAB — RESP PANEL BY RT-PCR (RSV, FLU A&B, COVID)  RVPGX2
Influenza A by PCR: NEGATIVE
Influenza B by PCR: NEGATIVE
Resp Syncytial Virus by PCR: NEGATIVE
SARS Coronavirus 2 by RT PCR: NEGATIVE

## 2023-12-01 LAB — CBC
HCT: 39.1 % (ref 39.0–52.0)
Hemoglobin: 14.3 g/dL (ref 13.0–17.0)
MCH: 34.9 pg — ABNORMAL HIGH (ref 26.0–34.0)
MCHC: 36.6 g/dL — ABNORMAL HIGH (ref 30.0–36.0)
MCV: 95.4 fL (ref 80.0–100.0)
Platelets: 183 10*3/uL (ref 150–400)
RBC: 4.1 MIL/uL — ABNORMAL LOW (ref 4.22–5.81)
RDW: 12.2 % (ref 11.5–15.5)
WBC: 9.3 10*3/uL (ref 4.0–10.5)
nRBC: 0 % (ref 0.0–0.2)

## 2023-12-01 LAB — PROTIME-INR
INR: 1.2 (ref 0.8–1.2)
Prothrombin Time: 15.1 s (ref 11.4–15.2)

## 2023-12-01 LAB — LACTIC ACID, PLASMA: Lactic Acid, Venous: 1.7 mmol/L (ref 0.5–1.9)

## 2023-12-01 LAB — PROCALCITONIN: Procalcitonin: 0.84 ng/mL

## 2023-12-01 MED ORDER — SODIUM CHLORIDE 0.9 % IV SOLN: Freq: Once | INTRAVENOUS | Status: AC

## 2023-12-01 MED ORDER — LEVOFLOXACIN IN D5W 750 MG/150ML IV SOLN
750.0000 mg | Freq: Once | INTRAVENOUS | Status: AC
  Administered 2023-12-01: 750 mg via INTRAVENOUS
  Filled 2023-12-01: qty 150

## 2023-12-01 MED ORDER — SODIUM CHLORIDE 0.9 % IV SOLN
2.0000 g | INTRAVENOUS | Status: DC
  Administered 2023-12-02 – 2023-12-03 (×2): 2 g via INTRAVENOUS
  Filled 2023-12-01 (×2): qty 20

## 2023-12-01 MED ORDER — LACTATED RINGERS IV SOLN: INTRAVENOUS | Status: DC

## 2023-12-01 MED ORDER — LACTATED RINGERS IV SOLN: INTRAVENOUS | Status: AC

## 2023-12-01 MED ORDER — LEVOFLOXACIN IN D5W 750 MG/150ML IV SOLN: 750.0000 mg | INTRAVENOUS | Status: DC

## 2023-12-01 MED ORDER — IPRATROPIUM-ALBUTEROL 0.5-2.5 (3) MG/3ML IN SOLN
3.0000 mL | Freq: Once | RESPIRATORY_TRACT | Status: AC
  Administered 2023-12-01: 3 mL via RESPIRATORY_TRACT
  Filled 2023-12-01: qty 3

## 2023-12-01 MED ORDER — POTASSIUM CHLORIDE CRYS ER 20 MEQ PO TBCR
40.0000 meq | EXTENDED_RELEASE_TABLET | Freq: Once | ORAL | Status: AC
  Administered 2023-12-01: 40 meq via ORAL
  Filled 2023-12-01: qty 2

## 2023-12-01 MED ORDER — MAGNESIUM SULFATE IN D5W 1-5 GM/100ML-% IV SOLN
1.0000 g | Freq: Once | INTRAVENOUS | Status: AC
  Administered 2023-12-01: 1 g via INTRAVENOUS
  Filled 2023-12-01: qty 100

## 2023-12-01 MED ORDER — HYDROCODONE BIT-HOMATROP MBR 5-1.5 MG/5ML PO SOLN
5.0000 mL | Freq: Four times a day (QID) | ORAL | Status: DC | PRN
  Administered 2023-12-02 – 2023-12-03 (×3): 5 mL via ORAL
  Filled 2023-12-01 (×3): qty 5

## 2023-12-01 MED ORDER — ACETAMINOPHEN 325 MG PO TABS
650.0000 mg | ORAL_TABLET | Freq: Four times a day (QID) | ORAL | Status: DC | PRN
  Administered 2023-12-01 – 2023-12-02 (×3): 650 mg via ORAL
  Filled 2023-12-01 (×3): qty 2

## 2023-12-01 MED ORDER — ALBUTEROL SULFATE (2.5 MG/3ML) 0.083% IN NEBU: 2.5000 mg | INHALATION_SOLUTION | RESPIRATORY_TRACT | Status: DC | PRN

## 2023-12-01 MED ORDER — SODIUM CHLORIDE 0.9 % IV SOLN
500.0000 mg | INTRAVENOUS | Status: DC
  Administered 2023-12-02 – 2023-12-03 (×2): 500 mg via INTRAVENOUS
  Filled 2023-12-01 (×2): qty 5

## 2023-12-01 MED ORDER — POTASSIUM CHLORIDE 10 MEQ/100ML IV SOLN
10.0000 meq | INTRAVENOUS | Status: AC
  Administered 2023-12-01 (×3): 10 meq via INTRAVENOUS
  Filled 2023-12-01 (×3): qty 100

## 2023-12-01 MED ORDER — ONDANSETRON HCL 4 MG/2ML IJ SOLN: 4.0000 mg | Freq: Four times a day (QID) | INTRAMUSCULAR | Status: DC | PRN

## 2023-12-01 MED ORDER — LACTATED RINGERS IV BOLUS (SEPSIS)
1000.0000 mL | Freq: Once | INTRAVENOUS | Status: AC
  Administered 2023-12-01: 1000 mL via INTRAVENOUS

## 2023-12-01 MED ORDER — GUAIFENESIN ER 600 MG PO TB12
600.0000 mg | ORAL_TABLET | Freq: Two times a day (BID) | ORAL | Status: DC
  Administered 2023-12-01 – 2023-12-03 (×4): 600 mg via ORAL
  Filled 2023-12-01 (×4): qty 1

## 2023-12-01 MED ORDER — ACETAMINOPHEN 500 MG PO TABS
1000.0000 mg | ORAL_TABLET | Freq: Once | ORAL | Status: AC
  Administered 2023-12-01: 1000 mg via ORAL
  Filled 2023-12-01: qty 2

## 2023-12-01 MED ORDER — ACETAMINOPHEN 650 MG RE SUPP: 650.0000 mg | Freq: Four times a day (QID) | RECTAL | Status: DC | PRN

## 2023-12-01 MED ORDER — HEPARIN SODIUM (PORCINE) 5000 UNIT/ML IJ SOLN
5000.0000 [IU] | Freq: Three times a day (TID) | INTRAMUSCULAR | Status: DC
  Administered 2023-12-01 – 2023-12-03 (×5): 5000 [IU] via SUBCUTANEOUS
  Filled 2023-12-01 (×5): qty 1

## 2023-12-01 MED ORDER — ONDANSETRON HCL 4 MG PO TABS: 4.0000 mg | ORAL_TABLET | Freq: Four times a day (QID) | ORAL | Status: DC | PRN

## 2023-12-01 NOTE — ED Triage Notes (Addendum)
 Pt c/o fever, chills, cough (yesterday I was coughing stuff up), poor PO/ dehydration since Monday. Denies resp/ cardiac hx No meds PTA RRT at bedside in triage to assess

## 2023-12-01 NOTE — Telephone Encounter (Addendum)
 Noted, being seen at ER with SOB, hypoxia on presentation, likely will be admitted. Labs, CXR pending.

## 2023-12-01 NOTE — ED Notes (Signed)
 Patient attempted to use the urinal with his wife. Patient had liquids BM. Patient assisted to bedside commode. Bed linens changed, new pad placed on bed. Patient cleaned, gown changed, and assisted back in to bed.

## 2023-12-01 NOTE — Telephone Encounter (Signed)
 FYI Only or Action Required?: FYI only for provider.  Patient was last seen in primary care on 04/03/2023 by Claire Crick, MD. Called Nurse Triage reporting Cough. Symptoms began several days ago. Interventions attempted: OTC medications: Alka Seltzer Cold and flu. Symptoms are: rapidly worsening.  Triage Disposition: ED ASAP Patient/caregiver understands and will follow disposition?: yes          Copied from CRM [id number]. Topic: Clinical - Red Word Triage >> Dec 01, 2023  9:27 AM Rosamond Comes wrote: Red Word that prompted transfer to Nurse Triage: patient has cough, chills, 102.4 fever, trouble breathing Reason for Disposition  Patient sounds very sick or weak to the triager  Answer Assessment - Initial Assessment Questions 1. ONSET: When did the cough begin?      Wednesday   2. SEVERITY: How bad is the cough today?      Cough persistent -   3. SPUTUM: Describe the color of your sputum (none, dry cough; clear, white, yellow, green)     Dry cough 4. HEMOPTYSIS: Are you coughing up any blood? If so ask: How much? (flecks, streaks, tablespoons, etc.)     no 5. DIFFICULTY BREATHING: Are you having difficulty breathing? If Yes, ask: How bad is it? (e.g., mild, moderate, severe)    - MILD: No SOB at rest, mild SOB with walking, speaks normally in sentences, can lie down, no retractions, pulse < 100.    - MODERATE: SOB at rest, SOB with minimal exertion and prefers to sit, cannot lie down flat, speaks in phrases, mild retractions, audible wheezing, pulse 100-120.    - SEVERE: Very SOB at rest, speaks in single words, struggling to breathe, sitting hunched forward, retractions, pulse > 120      moderate 6. FEVER: Do you have a fever? If Yes, ask: What is your temperature, how was it measured, and when did it start?     Yes 102.4- fever since all week 8. LUNG HISTORY: Do you have any history of lung disease?  (e.g., pulmonary embolus, asthma, emphysema)     no 10.  OTHER SYMPTOMS: Do you have any other symptoms? (e.g., runny nose, wheezing, chest pain)       Chills, poor fluid intake, dizziness, unsteadiness with walking, tea colored urine, cough no longer productive  Protocols used: Cough - Acute Non-Productive-A-AH

## 2023-12-01 NOTE — Progress Notes (Signed)
 Pharmacy Note   Pharmacy is consulted to dose levofloxacin . Per consult    Pharmacist to investigate beta-lactam allergy.  If history of intolerance, mild allergy, or documented  history of use of cephalosporins, pharmacy can adjust  levofloxacin  to ceftriaxone and azithromycin.    Pt listed allergy is to Augmentin stating SOB. Pt has used cephalosporins in our system in past, with use of cefazolin  in 2022.  Therefore will adjust antibiotics to ceftriaxone and azithromycin as above.    Van Gelinas, PharmD, BCPS 12/01/2023 8:40 PM

## 2023-12-01 NOTE — ED Notes (Signed)
 Called for transport at 6:10 spoke with [facility]

## 2023-12-01 NOTE — ED Provider Notes (Signed)
 [place] [facility]ERGENCY DEPARTMENT AT [facility] Provider Note   CSN: [id number] Arrival date & time: 12/01/23  1138     Patient presents with: URI   Michael Vaughan is a 58 y.o. male.    URI   58 year old male presents emergency department with complaints of fever, chills, cough, shortness of breath.  Patient states his symptoms began on Monday and progressively worsened since onset.  States that he began with shortness of breath on Wednesday or so.  Has had intermittent chills with documented fever yesterday into today.  Has not taken any medicines for his fever.  Denies any chest pain.  States his cough has been productive the past 2 to 3 days.  Denies any known sick contact.  Had a telehealth visit was told to come to the emergency department after explaining his symptoms.  Patient also states that he feels dehydrated.  Has had decreased p.o. intake due to not feeling well.  Denies any vomiting, abdominal pain or feelings of nausea.  Past medical history significant for arthritis, allergies, arthralgia, hereditary hemochromatosis.  Prior to Admission medications   Medication Sig Start Date End Date Taking? Authorizing Provider  amLODipine  (NORVASC ) 5 MG tablet Take 1 tablet (5 mg total) by mouth daily. 04/03/23   Rilla Baller, MD  pilocarpine  [facility]) 1 % ophthalmic solution Place 1 drop into the left eye 2 (two) times daily.  05/04/12   [provider]    Allergies: Augmentin [amoxicillin-pot clavulanate]    Review of Systems  All other systems reviewed and are negative.   Updated Vital Signs BP (!) 144/94   Pulse (!) 122   Temp (!) 101.8 F (38.8 C) (Oral)   Resp (!) 21   SpO2 93%   Physical Exam Vitals and nursing note reviewed.  Constitutional:      General: He is not in acute distress.    Appearance: He is well-developed. He is ill-appearing and diaphoretic.  HENT:     Head: Normocephalic and atraumatic.   Eyes:     Conjunctiva/sclera:  Conjunctivae normal.    Cardiovascular:     Rate and Rhythm: Regular rhythm. Tachycardia present.     Heart sounds: No murmur heard. Pulmonary:     Effort: Pulmonary effort is normal. No respiratory distress.     Comments: Patient tachypneic.  Faint Rales auscultated left lower lung field. Abdominal:     Palpations: Abdomen is soft.     Tenderness: There is no abdominal tenderness. There is no guarding.   Musculoskeletal:        General: No swelling.     Cervical back: Neck supple.   Skin:    General: Skin is warm.     Capillary Refill: Capillary refill takes less than 2 seconds.   Neurological:     Mental Status: He is alert.   Psychiatric:        Mood and Affect: Mood normal.     (all labs ordered are listed, but only abnormal results are displayed) Labs Reviewed  RESP PANEL BY RT-PCR (RSV, FLU A&B, COVID)  [id number]  CULTURE, BLOOD (ROUTINE X 2)  CULTURE, BLOOD (ROUTINE X 2)  LACTIC ACID, PLASMA  LACTIC ACID, PLASMA  COMPREHENSIVE METABOLIC PANEL WITH GFR  CBC WITH DIFFERENTIAL/PLATELET  PROTIME-INR  URINALYSIS, W/ REFLEX TO CULTURE (INFECTION SUSPECTED)    EKG: None  Radiology: No results found.   .Critical Care  Performed by: Silver Wonda LABOR, PA Authorized by: Silver Wonda LABOR, PA   Critical care provider  statement:    Critical care time (minutes):  64   Critical care was necessary to treat or prevent imminent or life-threatening deterioration of the following conditions:  Sepsis and respiratory failure   Critical care was time spent personally by me on the following activities:  Development of treatment plan with patient or surrogate, discussions with consultants, evaluation of patient's response to treatment, examination of patient, ordering and review of laboratory studies, ordering and review of radiographic studies, ordering and performing treatments and interventions, pulse oximetry, re-evaluation of patient's condition and review of old charts   I  assumed direction of critical care for this patient from another provider in my specialty: no     Care discussed with: admitting provider      Medications Ordered in the ED  lactated ringers  infusion (has no administration in time range)  lactated ringers  bolus 1,000 mL (has no administration in time range)  levofloxacin  (LEVAQUIN ) IVPB 750 mg (has no administration in time range)  acetaminophen  (TYLENOL ) tablet 1,000 mg (has no administration in time range)  ipratropium-albuterol  (DUONEB) 0.5-2.5 (3) MG/3ML nebulizer solution 3 mL (3 mLs Nebulization Given 12/01/23 1207)    Clinical Course as of 12/01/23 1805  Fri Dec 01, 2023  1634 Consulted hospitalist Dr. Soledad regarding the patient.  Desire to get a VBG and reconsult thereafter.   [CR]  1802 VBG resulted.  Hospitalist agreed with admission. [CR]    Clinical Course User Index [CR] Silver Wonda LABOR, PA                                 Medical Decision Making Amount and/or Complexity of Data Reviewed Labs: ordered. Radiology: ordered.  Risk OTC drugs. Prescription drug management. Decision regarding hospitalization.   This patient presents to the ED for concern of cough, this involves an extensive number of treatment options, and is a complaint that carries with it a high risk of complications and morbidity.  The differential diagnosis includes COVID, flu, RSV, pneumonia, sepsis, CHF, PE, ACS, other   Co morbidities that complicate the patient evaluation  See HPI   Additional history obtained:  Additional history obtained from EMR External records from outside source obtained and reviewed including hospital records   Lab Tests:  I Ordered, and personally interpreted labs.  The pertinent results include: No leukocytosis.  Polycythemia hemoglobin of 17.7.  Platelets within normal range.  Hyponatremia, hypokalemia, hypochloremia, decrease in bicarb of 126, 2.8, 90, 20 respectively.  Hypocalcemia of 8.7.  Hypomagnesemia  at 1.6.  No transaminitis.  Renal function at baseline.  Anion gap of 16.  UA without abnormality.  Viral testing negative.  Lactic acid within normal limits.  PT/INR within normal limits.  Blood cultures pending.  VBG with respiratory alkalosis pH of 7.58, PCO2 of 23.9 with normal bicarb.   Imaging Studies ordered:  I ordered imaging studies including chest x-ray I independently visualized and interpreted imaging which showed left-sided pneumonia I agree with the radiologist interpretation   Cardiac Monitoring: / EKG:  The patient was maintained on a cardiac monitor.  I personally viewed and interpreted the cardiac monitored which showed an underlying rhythm of: Sinus tachycardia.  Left atrial lodgment.   Consultations Obtained:  See ED course   Problem List / ED Course / Critical interventions / Medication management  Pneumonia, SIRS, acute respiratory failure with hypoxia, Tylenol  from potassium chloride , magnesium  sulfate I ordered medication including lactated Ringer 's, Levaquin   Reevaluation of the patient after these medicines showed that the patient improved I have reviewed the patients home medicines and have made adjustments as needed   Social Determinants of Health:  Former cigarette use.  Denies illicit drug use.   Test / Admission - Considered:  Pneumonia, SIRS, acute respiratory failure with hypoxia Vitals signs significant for initial tachycardia, tachypnea, febrile respiratory rate 21, heart rate of 122, temp of 101.8 orally of which improved with time labs medicines administered on emergency department.. Otherwise within normal range and stable throughout visit. Laboratory/imaging studies significant for: See above 58 year old male presents emergency department with complaints of fever, chills, cough, shortness of breath.  Patient states his symptoms began on Monday and progressively worsened since onset.  States that he began with shortness of breath on  Wednesday or so.  Has had intermittent chills with documented fever yesterday into today.  Has not taken any medicines for his fever.  Denies any chest pain.  States his cough has been productive the past 2 to 3 days.  Denies any known sick contact.  Had a telehealth visit was told to come to the emergency department after explaining his symptoms.  Patient also states that he feels dehydrated.  Has had decreased p.o. intake due to not feeling well.  Denies any vomiting, abdominal pain or feelings of nausea. On exam, patient initially febrile, tachycardic, tachypneic as well as hypoxic with ambulation O2 sats in the 80s with improvement of 2 L.  Initial exam showed rales in patient's left lower lung field.  Given continued SIRS criteria with presumed pneumonia, sepsis protocol was performed with beginning of antibiotics for CAP, fluid bolus and IV fluid infusion.  Workup today consistent with clinical suspicion.  Chest x-ray showed left-sided pneumonia.  No lactic acidosis or leukocytosis.  Metabolic panel significant for multiple electrolyte abnormalities.  VBG with respiratory alkalosis.  Patient is to received 1 L lactated Ringer 's with subsequent lactated Ringer 's infusion prior to metabolic panel resulting.  Once patient's sodium was found to be 126, lactated Ringer 's infusion was discontinued.  Given patient's hypoxia in the setting of pneumonia with meeting of SIRS criteria, patient deemed to meet admission criteria.  Treatment plan discussed with patient and family pain nausea understanding were agreeable to said plan.  Consulted hospitalist agreed with admission.      Final diagnoses:  None    ED Discharge Orders     None          Silver Wonda LABOR, [place] 12/01/23 1818    Cottie Donnice PARAS, MD 12/02/23 [phone]

## 2023-12-01 NOTE — ED Notes (Signed)
Pt unable to provide urine

## 2023-12-01 NOTE — ED Notes (Signed)
 Pt assessed for ambulating pulse oximetry.  Pt is already SOB at rest. O2 was on at 2lpm with sats of 93%.  O2 taken off and pts sats dropped to 89% while sitting in bed.  O2 reapplied at 2lpm. RN at bedside.  Provider updated.

## 2023-12-01 NOTE — H&P (Signed)
 History and Physical    Michael Vaughan FMW:[id number] DOB: 1966-05-26 DOA: 12/01/2023  PCP: Rilla Baller, MD  Patient coming from: [facility]  I have personally briefly reviewed patient's old medical records in [facility]  Chief Complaint: fever chills cough progressive x 5 days   HPI: Michael Vaughan is a 58 y.o. male with medical history significant of  GERD,Hereditary hemochromatosis, who presents to ED with progressive cough marvina /congestion  progressive x 5 days associated with poor intake.  Patient notes he took  over the counter medication but noted no improvement. He notes his breathing improved but still not back to baseline. Notes still feels fatigues but state he is getting his appetite back.   ED Course:  Vitlas: temp 101.8 bp 144/92, hr 122, rr 21  sat 93%  ZXH:dpwld tachycardia , Probable left atrial enlargement  Latic 1.7 Na 126 ( 139), K 2.8, cr 0.93,  Wbc 9.7, hgb 17.7, pmn 12.2 Vbg: 7.5/pco2 23.9 RVP:neg UA-neg Cxr :IMPRESSION: Heterogeneous ground-glass opacity within the left lung, suspicious for pneumonia.  Tx douneb, 1L LR,levaquin ,potassium 10 meq Review of Systems: As per HPI otherwise 10 point review of systems negative.   Past Medical History:  Diagnosis Date   Allergy    Arthralgia    takes aleve regularly   Arthritis    Cataract    Heartburn    Hereditary hemochromatosis (HCC) 05/2013   iron overload with mild HM, homozygous for C282Y, monthly blood draws   History of chicken pox    History of migraine 1990s   remote   Retinitis pigmentosa     Past Surgical History:  Procedure Laterality Date   CATARACT EXTRACTION BILATERAL W/ ANTERIOR VITRECTOMY Bilateral 2001   COLONOSCOPY  12/2016   WNL ([facility])   INGUINAL HERNIA REPAIR  1972   at age 71   IRIDECTOMY Right 01/14/2014   Procedure: IRIDECTOMY;  Surgeon: Norleen JONETTA Ku, MD;  Location: [facility] OR;  Service: Ophthalmology;  Laterality: Right;   PARS PLANA VITRECTOMY  01/24/2012    Procedure: PARS PLANA VITRECTOMY WITH 25G REMOVAL/SUTURE INTRAOCULAR LENS;  Surgeon: Norleen JONETTA Ku, MD;  Location: [facility] OR;  Service: Ophthalmology;  Laterality: Left;   PARS PLANA VITRECTOMY Right 01/14/2014   dislocated intraocular lens - PARS PLANA VITRECTOMY WITH 25G REMOVAL/SUTURE SECONDARY INTRAOCULAR LENS;  Norleen JONETTA Ku, MD   PHOTOCOAGULATION WITH LASER Right 01/14/2014   Procedure: PHOTOCOAGULATION WITH LASER;  Surgeon: Norleen JONETTA Ku, MD;  Location: [facility] OR;  Service: Ophthalmology;  Laterality: Right;   TOTAL HIP ARTHROPLASTY Right 04/27/2021   Procedure: TOTAL HIP ARTHROPLASTY ANTERIOR APPROACH;  Surgeon: Ernie Cough, MD;  Location: [facility] ORS;  Service: Orthopedics;  Laterality: Right;     reports that he quit smoking about 19 years ago. His smoking use included cigarettes. His smokeless tobacco use includes snuff. He reports current alcohol use. He reports that he does not use drugs.  Allergies  Allergen Reactions   Augmentin [Amoxicillin-Pot Clavulanate] Shortness Of Breath, Swelling and Rash    No problems with amox    Family History  Problem Relation Age of Onset   Colon cancer Paternal Aunt 75   Leukemia Father        CLL spread to brain   Hypertension Father        and mother and family   Breast cancer Paternal Aunt    Prostate cancer Brother 67   Hyperlipidemia Other        father's side   CAD Paternal Uncle  MI, CABG   Stroke Maternal Grandmother    Diabetes Other        mother's side   Deep vein thrombosis Brother        with PE   Stroke Maternal Grandfather    Hypertension Mother    Esophageal cancer Neg Hx    Liver cancer Neg Hx    Pancreatic cancer Neg Hx    Rectal cancer Neg Hx    Stomach cancer Neg Hx     Prior to Admission medications   Medication Sig Start Date End Date Taking? Authorizing Provider  amLODipine  (NORVASC ) 5 MG tablet Take 1 tablet (5 mg total) by mouth daily. 04/03/23   Rilla Baller, MD  pilocarpine  [facility]) 1 %  ophthalmic solution Place 1 drop into the left eye 2 (two) times daily.  05/04/12   [provider]    Physical Exam: Vitals:   12/01/23 1806 12/01/23 1815 12/01/23 1830 12/01/23 1926  BP:    133/80  Pulse:  88 90 95  Resp:  (!) 23 18 (!) 23  Temp: 98 F (36.7 C)   98.7 F (37.1 C)  TempSrc:      SpO2:  97% 97% 97%  Weight:      Height:        Constitutional: NAD, calm, comfortable Vitals:   12/01/23 1806 12/01/23 1815 12/01/23 1830 12/01/23 1926  BP:    133/80  Pulse:  88 90 95  Resp:  (!) 23 18 (!) 23  Temp: 98 F (36.7 C)   98.7 F (37.1 C)  TempSrc:      SpO2:  97% 97% 97%  Weight:      Height:       Eyes: PERRL, lids and conjunctivae normal ENMT: Mucous membranes are moist. Posterior pharynx clear of any exudate or lesions.Normal dentition.  Neck: normal, supple, no masses, no thyromegaly Respiratory: diminished on left base,  no wheezing, no crackles. Normal respiratory effort. No accessory muscle use.  Cardiovascular: Regular rate and rhythm, no murmurs / rubs / gallops. No extremity edema. 2+ pedal pulses.   Abdomen: no tenderness, no masses palpated. No hepatosplenomegaly. Bowel sounds positive.  Musculoskeletal: no clubbing / cyanosis. No joint deformity upper and lower extremities. Good ROM, no contractures. Normal muscle tone.  Skin: no rashes, lesions, ulcers. No induration Neurologic: CN 2-12 grossly intact. Sensation intact,Strength 5/5 in all 4.  Psychiatric: Normal judgment and insight. Alert and oriented x 3. Normal mood.    Labs on Admission: I have personally reviewed following labs and imaging studies  CBC: Recent Labs  Lab 12/01/23 1340 12/01/23 1700  WBC 9.7  --   NEUTROABS 12.2*  --   HGB 17.7* 12.2*  HCT 47.8 36.0*  MCV 92.6  --   PLT 176  --    Basic Metabolic Panel: Recent Labs  Lab 12/01/23 1340 12/01/23 1431 12/01/23 1700  NA 126*  --  127*  K 2.8*  --  4.6  CL 90*  --   --   CO2 20*  --   --   GLUCOSE 139*  --    --   BUN 20  --   --   CREATININE 0.93  --   --   CALCIUM 8.7*  --   --   MG  --  1.6*  --    GFR: Estimated Creatinine Clearance: 100.3 mL/min (by C-G formula based on SCr of 0.93 mg/dL). Liver Function Tests: Recent Labs  Lab 12/01/23 1340  AST 35  ALT 31  ALKPHOS 58  BILITOT 2.2*  PROT 6.0*  ALBUMIN 3.2*   No results for input(s): LIPASE, AMYLASE in the last 168 hours. No results for input(s): AMMONIA in the last 168 hours. Coagulation Profile: Recent Labs  Lab 12/01/23 1221  INR 1.2   Cardiac Enzymes: No results for input(s): CKTOTAL, CKMB, CKMBINDEX, TROPONINI in the last 168 hours. BNP (last 3 results) No results for input(s): PROBNP in the last 8760 hours. HbA1C: No results for input(s): HGBA1C in the last 72 hours. CBG: No results for input(s): GLUCAP in the last 168 hours. Lipid Profile: No results for input(s): CHOL, HDL, LDLCALC, TRIG, CHOLHDL, LDLDIRECT in the last 72 hours. Thyroid  Function Tests: No results for input(s): TSH, T4TOTAL, FREET4, T3FREE, THYROIDAB in the last 72 hours. Anemia Panel: No results for input(s): VITAMINB12, FOLATE, FERRITIN, TIBC, IRON, RETICCTPCT in the last 72 hours. Urine analysis:    Component Value Date/Time   COLORURINE YELLOW 12/01/2023 1221   APPEARANCEUR CLEAR 12/01/2023 1221   LABSPEC 1.009 12/01/2023 1221   PHURINE 6.0 12/01/2023 1221   GLUCOSEU NEGATIVE 12/01/2023 1221   HGBUR NEGATIVE 12/01/2023 1221   BILIRUBINUR NEGATIVE 12/01/2023 1221   BILIRUBINUR negative 03/03/2021 1047   KETONESUR NEGATIVE 12/01/2023 1221   PROTEINUR NEGATIVE 12/01/2023 1221   UROBILINOGEN 0.2 03/03/2021 1047   NITRITE NEGATIVE 12/01/2023 1221   LEUKOCYTESUR NEGATIVE 12/01/2023 1221    Radiological Exams on Admission: DG Chest 2 View Result Date: 12/01/2023 CLINICAL DATA:  Possible sepsis EXAM: CHEST - 2 VIEW COMPARISON:  03/03/2021 FINDINGS: Heterogeneous ground-glass opacity  within the left lung, suspicious for pneumonia. No pleural effusion. Normal cardiac size. No pneumothorax IMPRESSION: Heterogeneous ground-glass opacity within the left lung, suspicious for pneumonia. Electronically Signed   By: Luke Bun [name]   On: 12/01/2023 16:05    EKG: Independently reviewed. See above  Assessment/Plan CAP with Sepsis without hypoxic respiratory failure (Fever, tachycardia/tachypnea/) -patient with increase wbc and  Opacities on cxr unable to r/o infection  -levaquin  due to hx of allergy de-escalate as able  -RVP negative -pulmonary toilet  -urine ag, sputum, f/u on culture data  -ivfs goal directed    GERD -ppi  Hereditary hemochromatosis -no acute issues  -follows with Dr Federico   DVT prophylaxis: heparin  Code Status: full/ as discussed per patient wishes in event of cardiac arrest  Family Communication: none at bedside Disposition Plan: full/ as discussed per patient wishes in event of cardiac arrest  Consults called: n/a Admission status: med tele   Camila DELENA Ned MD [facility]   If 7PM-7AM, please contact night-coverage [email or web address] Password [facility]  12/01/2023, 7:55 PM

## 2023-12-01 NOTE — ED Notes (Signed)
 Instructed to stop NS infusion and Kcl infusion by Arlester Ladd, PA per hospitalist request.

## 2023-12-01 NOTE — Sepsis Progress Note (Signed)
 Code Sepsis protocol being monitored by eLink.

## 2023-12-02 DIAGNOSIS — J189 Pneumonia, unspecified organism: Secondary | ICD-10-CM | POA: Diagnosis present

## 2023-12-02 DIAGNOSIS — R6520 Severe sepsis without septic shock: Secondary | ICD-10-CM

## 2023-12-02 DIAGNOSIS — A419 Sepsis, unspecified organism: Secondary | ICD-10-CM | POA: Diagnosis not present

## 2023-12-02 HISTORY — DX: Pneumonia, unspecified organism: J18.9

## 2023-12-02 LAB — CBC
HCT: 35.9 % — ABNORMAL LOW (ref 39.0–52.0)
Hemoglobin: 12.8 g/dL — ABNORMAL LOW (ref 13.0–17.0)
MCH: 34 pg (ref 26.0–34.0)
MCHC: 35.7 g/dL (ref 30.0–36.0)
MCV: 95.5 fL (ref 80.0–100.0)
Platelets: 173 10*3/uL (ref 150–400)
RBC: 3.76 MIL/uL — ABNORMAL LOW (ref 4.22–5.81)
RDW: 12.4 % (ref 11.5–15.5)
WBC: 9 10*3/uL (ref 4.0–10.5)
nRBC: 0 % (ref 0.0–0.2)

## 2023-12-02 LAB — COMPREHENSIVE METABOLIC PANEL WITH GFR
ALT: 29 U/L (ref 0–44)
AST: 35 U/L (ref 15–41)
Albumin: 2.8 g/dL — ABNORMAL LOW (ref 3.5–5.0)
Alkaline Phosphatase: 48 U/L (ref 38–126)
Anion gap: 11 (ref 5–15)
BUN: 20 mg/dL (ref 6–20)
CO2: 22 mmol/L (ref 22–32)
Calcium: 8.1 mg/dL — ABNORMAL LOW (ref 8.9–10.3)
Chloride: 96 mmol/L — ABNORMAL LOW (ref 98–111)
Creatinine, Ser: 0.91 mg/dL (ref 0.61–1.24)
GFR, Estimated: >60 mL/min (ref >60)
Glucose, Bld: 113 mg/dL — ABNORMAL HIGH (ref 70–99)
Potassium: 3.2 mmol/L — ABNORMAL LOW (ref 3.5–5.1)
Sodium: 129 mmol/L — ABNORMAL LOW (ref 135–145)
Total Bilirubin: 2.1 mg/dL — ABNORMAL HIGH (ref 0.0–1.2)
Total Protein: 5.5 g/dL — ABNORMAL LOW (ref 6.5–8.1)

## 2023-12-02 LAB — EXPECTORATED SPUTUM ASSESSMENT W GRAM STAIN, RFLX TO RESP C

## 2023-12-02 LAB — HIV ANTIBODY (ROUTINE TESTING W REFLEX): HIV Screen 4th Generation wRfx: NONREACTIVE

## 2023-12-02 LAB — SODIUM, URINE, RANDOM: Sodium, Ur: 18 mmol/L

## 2023-12-02 LAB — STREP PNEUMONIAE URINARY ANTIGEN: Strep Pneumo Urinary Antigen: NEGATIVE

## 2023-12-02 MED ORDER — PILOCARPINE HCL 1 % OP SOLN
1.0000 [drp] | Freq: Two times a day (BID) | OPHTHALMIC | Status: DC
  Administered 2023-12-02 – 2023-12-03 (×3): 1 [drp] via OPHTHALMIC
  Filled 2023-12-02: qty 15

## 2023-12-02 MED ORDER — POTASSIUM CHLORIDE CRYS ER 20 MEQ PO TBCR
40.0000 meq | EXTENDED_RELEASE_TABLET | ORAL | Status: AC
  Administered 2023-12-02 (×2): 40 meq via ORAL
  Filled 2023-12-02 (×2): qty 2

## 2023-12-02 MED ORDER — POTASSIUM CHLORIDE CRYS ER 20 MEQ PO TBCR
40.0000 meq | EXTENDED_RELEASE_TABLET | Freq: Once | ORAL | Status: AC
  Administered 2023-12-02: 40 meq via ORAL
  Filled 2023-12-02: qty 2

## 2023-12-02 NOTE — Progress Notes (Signed)
   12/02/23 1459  TOC Brief Assessment  Insurance and Status Reviewed  Patient has primary care physician Yes  Home environment has been reviewed Resides in single family home with spouse  Prior level of function: Independent with ADLs at baseline  Prior/Current Home Services No current home services  Social Drivers of Health Review SDOH reviewed no interventions necessary  Readmission risk has been reviewed Yes  Transition of care needs no transition of care needs at this time

## 2023-12-02 NOTE — Progress Notes (Addendum)
 PROGRESS NOTE    Michael Vaughan  FMW:[id number] DOB: 1966/01/21 DOA: 12/01/2023 PCP: Rilla Baller, MD   Brief Narrative:  HPI: Michael Vaughan is a 58 y.o. male with medical history significant of  GERD,Hereditary hemochromatosis, who presents to ED with progressive cough marvina /congestion  progressive x 5 days associated with poor intake.  Patient notes he took  over the counter medication but noted no improvement. He notes his breathing improved but still not back to baseline. Notes still feels fatigues but state he is getting his appetite back.    ED Course:  Vitlas: temp 101.8 bp 144/92, hr 122, rr 21  sat 93%  ZXH:dpwld tachycardia , Probable left atrial enlargement  Latic 1.7 Na 126 ( 139), K 2.8, cr 0.93,  Wbc 9.7, hgb 17.7, pmn 12.2 Vbg: 7.5/pco2 23.9 RVP:neg UA-neg Cxr :IMPRESSION: Heterogeneous ground-glass opacity within the left lung, suspicious for pneumonia.  Assessment & Plan:   Principal Problem:   Severe sepsis (HCC) Active Problems:   CAP (community acquired pneumonia)  Severe sepsis secondary to community-acquired pneumonia, POA: Patient met criteria for severe sepsis based on tachycardia, tachypnea, fever of 101.8 and acute hypoxic respiratory failure.  Lactic acid was normal.  Started on Rocephin  and Zithromax .  Initially required 2 L of oxygen, currently on room air.  Symptomatically slightly better.  Will continue current antibiotics.  Await final results of the Legionella and urine antigen for streptococci as well as sputum culture and blood culture.  Last temperature spike of 100.5 at 9 PM on 12/01/2023.  Will observe another night.  Hypokalemia: Replenished.  Hyponatremia: Suspecting SIADH due to pneumonia.  Will check serum osmolality, urine osmolality and urine sodium.  Essential hypertension: Blood pressure normal.  PTA on metformin which we will hold.  GERD: PPI  History of hereditary hemochromatosis: Follows with Dr. Federico as outpatient.   Currently no issues.  Class I obesity: BMI 30.13.  Weight loss and diet modification counseled.  DVT prophylaxis: heparin  injection 5,000 Units Start: 12/01/23 2200   Code Status: Full Code  Family Communication: Wife present at bedside.  Plan of care discussed with patient in length and he/she verbalized understanding and agreed with it.  Status is: Inpatient Remains inpatient appropriate because: Febrile last night.   Estimated body mass index is 30.13 kg/m as calculated from the following:   Height as of this encounter: 5' 10 (1.778 m).   Weight as of this encounter: 95.3 kg.    Nutritional Assessment: Body mass index is 30.13 kg/m.SABRA Seen by dietician.  I agree with the assessment and plan as outlined below: Nutrition Status:        . Skin Assessment: I have examined the patient's skin and I agree with the wound assessment as performed by the wound care RN as outlined below:    Consultants:  None  Procedures:  None  Antimicrobials:  Anti-infectives (From admission, onward)    Start     Dose/Rate Route Frequency Ordered Stop   12/02/23 1000  levofloxacin  (LEVAQUIN ) IVPB 750 mg  Status:  Discontinued        750 mg 100 mL/hr over 90 Minutes Intravenous Every 24 hours 12/01/23 2029 12/01/23 2037   12/02/23 1000  cefTRIAXone  (ROCEPHIN ) 2 g in sodium chloride  0.9 % 100 mL IVPB        2 g 200 mL/hr over 30 Minutes Intravenous Every 24 hours 12/01/23 2037     12/02/23 1000  azithromycin  (ZITHROMAX ) 500 mg in sodium chloride  0.9 % 250 mL  IVPB        500 mg 250 mL/hr over 60 Minutes Intravenous Every 24 hours 12/01/23 2037     12/01/23 1215  levofloxacin  (LEVAQUIN ) IVPB 750 mg        750 mg 100 mL/hr over 90 Minutes Intravenous  Once 12/01/23 1202 12/01/23 1422         Subjective: Patient seen and examined, wife at the bedside.  He was eating breakfast to be in the bed.  He feels much better.  Breathing has improved.  He has been weaned off of  oxygen.  Objective: Vitals:   12/02/23 0537 12/02/23 0910 12/02/23 0936 12/02/23 0937  BP: 123/80  118/78   Pulse: 87  79   Resp: 14  20   Temp: 98 F (36.7 C) 98.8 F (37.1 C) 98.5 F (36.9 C)   TempSrc: Oral Oral Oral   SpO2: 95%  94% 93%  Weight:      Height:        Intake/Output Summary (Last 24 hours) at 12/02/2023 1018 Last data filed at 12/02/2023 0700 Gross per 24 hour  Intake 1344.27 ml  Output 325 ml  Net 1019.27 ml   Filed Weights   12/01/23 1243  Weight: 95.3 kg    Examination:  General exam: Appears calm and comfortable, obese Respiratory system: Rhonchi bilaterally. Respiratory effort normal. Cardiovascular system: S1 & S2 heard, RRR. No JVD, murmurs, rubs, gallops or clicks. No pedal edema. Gastrointestinal system: Abdomen is nondistended, soft and nontender. No organomegaly or masses felt. Normal bowel sounds heard. Central nervous system: Alert and oriented. No focal neurological deficits. Extremities: Symmetric 5 x 5 power. Skin: No rashes, lesions or ulcers Psychiatry: Judgement and insight appear normal. Mood & affect appropriate.    Data Reviewed: I have personally reviewed following labs and imaging studies  CBC: Recent Labs  Lab 12/01/23 1340 12/01/23 1700 12/01/23 2052 12/02/23 0433  WBC 9.7  --  9.3 9.0  NEUTROABS 12.2*  --   --   --   HGB 17.7* 12.2* 14.3 12.8*  HCT 47.8 36.0* 39.1 35.9*  MCV 92.6  --  95.4 95.5  PLT 176  --  183 173   Basic Metabolic Panel: Recent Labs  Lab 12/01/23 1340 12/01/23 1431 12/01/23 1700 12/01/23 2052 12/02/23 0433  NA 126*  --  127* 131* 129*  K 2.8*  --  4.6 3.7 3.2*  CL 90*  --   --  98 96*  CO2 20*  --   --  24 22  GLUCOSE 139*  --   --  117* 113*  BUN 20  --   --  19 20  CREATININE 0.93  --   --  0.90 0.91  CALCIUM 8.7*  --   --  8.2* 8.1*  MG  --  1.6*  --   --   --    GFR: Estimated Creatinine Clearance: 102.5 mL/min (by C-G formula based on SCr of 0.91 mg/dL). Liver Function  Tests: Recent Labs  Lab 12/01/23 1340 12/02/23 0433  AST 35 35  ALT 31 29  ALKPHOS 58 48  BILITOT 2.2* 2.1*  PROT 6.0* 5.5*  ALBUMIN 3.2* 2.8*   No results for input(s): LIPASE, AMYLASE in the last 168 hours. No results for input(s): AMMONIA in the last 168 hours. Coagulation Profile: Recent Labs  Lab 12/01/23 1221  INR 1.2   Cardiac Enzymes: No results for input(s): CKTOTAL, CKMB, CKMBINDEX, TROPONINI in the last 168 hours. BNP (last 3  results) No results for input(s): PROBNP in the last 8760 hours. HbA1C: No results for input(s): HGBA1C in the last 72 hours. CBG: No results for input(s): GLUCAP in the last 168 hours. Lipid Profile: No results for input(s): CHOL, HDL, LDLCALC, TRIG, CHOLHDL, LDLDIRECT in the last 72 hours. Thyroid  Function Tests: No results for input(s): TSH, T4TOTAL, FREET4, T3FREE, THYROIDAB in the last 72 hours. Anemia Panel: No results for input(s): VITAMINB12, FOLATE, FERRITIN, TIBC, IRON, RETICCTPCT in the last 72 hours. Sepsis Labs: Recent Labs  Lab 12/01/23 1221 12/01/23 2052  PROCALCITON  --  0.84  LATICACIDVEN 1.7  --     Recent Results (from the past 240 hours)  Blood Culture (routine x 2)     Status: None (Preliminary result)   Collection Time: 12/01/23 12:01 PM   Specimen: BLOOD  Result Value Ref Range Status   Specimen Description   Final    BLOOD LEFT ANTECUBITAL Performed at [facility], [address], [place], [place] [address]    Special Requests   Final    Blood Culture adequate volume BOTTLES DRAWN AEROBIC AND ANAEROBIC Performed at [facility], [address] [place], [place] [address]    Culture   Final    NO GROWTH < 24 HOURS Performed at [facility], [address] [place], [place] [address]    Report Status PENDING  Incomplete  Blood Culture (routine x 2)     Status: None (Preliminary result)   Collection  Time: 12/01/23 12:06 PM   Specimen: BLOOD LEFT FOREARM  Result Value Ref Range Status   Specimen Description   Final    BLOOD LEFT FOREARM Performed at [facility], [address] [place], [place] [address]    Special Requests   Final    Blood Culture adequate volume BOTTLES DRAWN AEROBIC AND ANAEROBIC Performed at [facility], [address] [place], [place] [address]    Culture   Final    NO GROWTH < 24 HOURS Performed at [facility], [address] [place], [place] [address]    Report Status PENDING  Incomplete  Resp panel by RT-PCR (RSV, Flu A&B, Covid) Peripheral     Status: None   Collection Time: 12/01/23 12:21 PM   Specimen: Peripheral; Nasal Swab  Result Value Ref Range Status   SARS Coronavirus 2 by RT PCR NEGATIVE NEGATIVE Final    Comment: (NOTE) SARS-CoV-2 target nucleic acids are NOT DETECTED.  The SARS-CoV-2 RNA is generally detectable in upper respiratory specimens during the acute phase of infection. The lowest concentration of SARS-CoV-2 viral copies this assay can detect is 138 copies/mL. A negative result does not preclude SARS-Cov-2 infection and should not be used as the sole basis for treatment or other patient management decisions. A negative result may occur with  improper specimen collection/handling, submission of specimen other than nasopharyngeal swab, presence of viral mutation(s) within the areas targeted by this assay, and inadequate number of viral copies(<138 copies/mL). A negative result must be combined with clinical observations, patient history, and epidemiological information. The expected result is Negative.  Fact Sheet for Patients:  BloggerCourse.com  Fact Sheet for Healthcare Providers:  SeriousBroker.it  This test is no t yet approved or cleared by the [place]  FDA and  has been authorized for detection and/or diagnosis of SARS-CoV-2  by FDA under an Emergency Use Authorization (EUA). This EUA will remain  in effect (meaning this test can be used) for the  duration of the COVID-19 declaration under Section 564(b)(1) of the Act, 21 U.S.C.section 360bbb-3(b)(1), unless the authorization is terminated  or revoked sooner.       Influenza A by PCR NEGATIVE NEGATIVE Final   Influenza B by PCR NEGATIVE NEGATIVE Final    Comment: (NOTE) The Xpert Xpress SARS-CoV-2/FLU/RSV plus assay is intended as an aid in the diagnosis of influenza from Nasopharyngeal swab specimens and should not be used as a sole basis for treatment. Nasal washings and aspirates are unacceptable for Xpert Xpress SARS-CoV-2/FLU/RSV testing.  Fact Sheet for Patients: BloggerCourse.com  Fact Sheet for Healthcare Providers: SeriousBroker.it  This test is not yet approved or cleared by the [place]  FDA and has been authorized for detection and/or diagnosis of SARS-CoV-2 by FDA under an Emergency Use Authorization (EUA). This EUA will remain in effect (meaning this test can be used) for the duration of the COVID-19 declaration under Section 564(b)(1) of the Act, 21 U.S.C. section 360bbb-3(b)(1), unless the authorization is terminated or revoked.     Resp Syncytial Virus by PCR NEGATIVE NEGATIVE Final    Comment: (NOTE) Fact Sheet for Patients: BloggerCourse.com  Fact Sheet for Healthcare Providers: SeriousBroker.it  This test is not yet approved or cleared by the [place]  FDA and has been authorized for detection and/or diagnosis of SARS-CoV-2 by FDA under an Emergency Use Authorization (EUA). This EUA will remain in effect (meaning this test can be used) for the duration of the COVID-19 declaration under Section 564(b)(1) of the Act, 21 U.S.C. section 360bbb-3(b)(1), unless the authorization is terminated or revoked.  Performed at [facility], [address] [place], [place] [address]      Radiology Studies: DG Chest 2 View Result Date: 12/01/2023 CLINICAL DATA:  Possible sepsis EXAM: CHEST - 2 VIEW COMPARISON:  03/03/2021 FINDINGS: Heterogeneous ground-glass opacity within the left lung, suspicious for pneumonia. No pleural effusion. Normal cardiac size. No pneumothorax IMPRESSION: Heterogeneous ground-glass opacity within the left lung, suspicious for pneumonia. Electronically Signed   By: Luke Bun [name]   On: 12/01/2023 16:05    Scheduled Meds:  guaiFENesin   600 mg Oral BID   heparin   5,000 Units Subcutaneous Q8H   pilocarpine   1 drop Left Eye BID   potassium chloride   40 mEq Oral Q4H   Continuous Infusions:  azithromycin      cefTRIAXone  (ROCEPHIN )  IV 2 g (12/02/23 0922)   lactated ringers  100 mL/hr at 12/02/23 0640     LOS: 1 day   Fredia Skeeter, MD [facility]  12/02/2023, 10:18 AM   *Please note that this is a verbal dictation therefore any spelling or grammatical errors are due to the Dragon Medical One system interpretation.  Please page via Amion and do not message via secure chat for urgent patient care matters. Secure chat can be used for non urgent patient care matters.  How to contact the [facility] Attending or Consulting provider 7A - 7P or covering provider during after hours 7P -7A, for this patient?  Check the care team in [facility] and look for a) attending/consulting [facility] provider listed and b) the [facility] team listed. Page or secure chat 7A-7P. Log into [email or web address] and use [facility] universal password to access. If you do not have the password, please contact the hospital operator. Locate the [facility] provider you are looking for under [facility] and page to a number that you can be directly reached. If you still have difficulty reaching the provider, please page the [facility] (Director on  Call) for the Hospitalists listed on amion for assistance.

## 2023-12-03 DIAGNOSIS — A419 Sepsis, unspecified organism: Secondary | ICD-10-CM | POA: Diagnosis not present

## 2023-12-03 DIAGNOSIS — R6520 Severe sepsis without septic shock: Secondary | ICD-10-CM | POA: Diagnosis not present

## 2023-12-03 LAB — BASIC METABOLIC PANEL WITH GFR
Anion gap: 8 (ref 5–15)
BUN: 15 mg/dL (ref 6–20)
CO2: 23 mmol/L (ref 22–32)
Calcium: 8.2 mg/dL — ABNORMAL LOW (ref 8.9–10.3)
Chloride: 102 mmol/L (ref 98–111)
Creatinine, Ser: 0.71 mg/dL (ref 0.61–1.24)
GFR, Estimated: >60 mL/min (ref >60)
Glucose, Bld: 114 mg/dL — ABNORMAL HIGH (ref 70–99)
Potassium: 3.5 mmol/L (ref 3.5–5.1)
Sodium: 133 mmol/L — ABNORMAL LOW (ref 135–145)

## 2023-12-03 LAB — CBC WITH DIFFERENTIAL/PLATELET
Abs Immature Granulocytes: 0.12 10*3/uL — ABNORMAL HIGH (ref 0.00–0.07)
Basophils Absolute: 0 10*3/uL (ref 0.0–0.1)
Basophils Relative: 1 %
Eosinophils Absolute: 0.3 10*3/uL (ref 0.0–0.5)
Eosinophils Relative: 4 %
HCT: 36.2 % — ABNORMAL LOW (ref 39.0–52.0)
Hemoglobin: 12.6 g/dL — ABNORMAL LOW (ref 13.0–17.0)
Immature Granulocytes: 2 %
Lymphocytes Relative: 28 %
Lymphs Abs: 1.6 10*3/uL (ref 0.7–4.0)
MCH: 34 pg (ref 26.0–34.0)
MCHC: 34.8 g/dL (ref 30.0–36.0)
MCV: 97.6 fL (ref 80.0–100.0)
Monocytes Absolute: 0.5 10*3/uL (ref 0.1–1.0)
Monocytes Relative: 8 %
Neutro Abs: 3.4 10*3/uL (ref 1.7–7.7)
Neutrophils Relative %: 57 %
Platelets: 200 10*3/uL (ref 150–400)
RBC: 3.71 MIL/uL — ABNORMAL LOW (ref 4.22–5.81)
RDW: 12.7 % (ref 11.5–15.5)
WBC: 5.9 10*3/uL (ref 4.0–10.5)
nRBC: 0 % (ref 0.0–0.2)

## 2023-12-03 MED ORDER — CEFDINIR 300 MG PO CAPS: 300.0000 mg | ORAL_CAPSULE | Freq: Two times a day (BID) | ORAL | 0 refills | Status: AC

## 2023-12-03 NOTE — Discharge Summary (Signed)
 Physician Discharge Summary  Michael Vaughan FMW:[id number] DOB: December 13, 1965 DOA: 12/01/2023  PCP: Rilla Baller, MD  Admit date: 12/01/2023 Discharge date: 12/03/2023 30 Day Unplanned Readmission Risk Score    Flowsheet Row ED to Hosp-Admission (Current) from 12/01/2023 in [facility] FLOOR PROGRESSIVE CARE AND UROLOGY  30 Day Unplanned Readmission Risk Score (%) 9.62 Filed at 12/03/2023 0801    This score is the patient's risk of an unplanned readmission within 30 days of being discharged (0 -100%). The score is based on dignosis, age, lab data, medications, orders, and past utilization.   Low:  0-14.9   Medium: 15-21.9   High: 22-29.9   Extreme: 30 and above          Admitted From: Home Disposition: Home  Recommendations for Outpatient Follow-up:  Follow up with PCP in 1-2 weeks Please obtain BMP/CBC in one week Please follow up with your PCP on the following pending results: Unresulted Labs (From admission, onward)     Start     Ordered   12/02/23 0840  Osmolality, urine  Add-on,   AD        12/02/23 0839   12/02/23 0840  Sodium, urine, random  Add-on,   AD        12/02/23 0839   12/01/23 2024  Legionella Pneumophila Serogp 1 Ur Ag  (COPD / Pneumonia / Cellulitis / Lower Extremity Wound (Diabetic Foot Infection))  Once,   R        12/01/23 2029   12/01/23 1201  CBC with Differential  (Septic presentation on arrival (screening labs, nursing and treatment orders for obvious sepsis))  ONCE - STAT,   STAT        12/01/23 1202              Home Health: None Equipment/Devices: None  Discharge Condition: Stable CODE STATUS: Full code Diet recommendation: Cardiac  Subjective: Seen and examined, wife at the bedside.  He says that he was slightly confused earlier when he went to the bathroom however he is not confused anymore.  His breathing is better.  He appears comfortable.  Plan of discharge discussed with patient and wife, they both are in  agreement.  Brief/Interim Summary: Michael Vaughan is a 58 y.o. male with medical history significant of GERD,Hereditary hemochromatosis, who presented to ED with progressive cough marvina /congestion  progressive x 5 days associated with poor intake.  Vitlas in the ED, temp 101.8 bp 144/92, hr 122, rr 21  sat 93%  ZXH:dpwld tachycardia , Probable left atrial enlargement, Latic 1.7.  Sodium 126, normal white cells, hemoglobin normal, respiratory viral panel negative as well as UA.  Chest x-ray showed heterogeneous groundglass opacity within the left lung suspicious for pneumonia.  Patient was admitted under hospitalist service for the following.  Severe sepsis secondary to community-acquired pneumonia, POA: Patient met criteria for severe sepsis based on tachycardia, tachypnea, fever of 101.8 and acute hypoxic respiratory failure.  Lactic acid was normal.  Started on Rocephin  and Zithromax .  Initially required 2 L of oxygen, shortly after, weaned to room air.  He has no symptoms, lungs clear to auscultation today.  Patient tolerated Rocephin  and Zithromax .  Patient appears to have allergy listed to penicillins.  Since he tolerated Rocephin , plan to discharge him on cefdinir for 5 days.  Last temperature spike of 100.5 at 9 PM on 12/01/2023.  All cultures negative.   Hypokalemia: Replenished.  Resolved.   Hyponatremia: Suspecting SIADH due to pneumonia.  Ordered serum osmolality,  urine osmolality and urine sodium yesterday but labs are still pending.  Sodium improved to 133.   Essential hypertension: Blood pressure normal.  Resume home medications.   GERD: PPI   History of hereditary hemochromatosis: Follows with Dr. Federico as outpatient.  Currently no issues.   Class I obesity: BMI 30.13.  Weight loss and diet modification counseled.  Discharge plan was discussed with patient and/or family member and they verbalized understanding and agreed with it.  Discharge Diagnoses:  Principal Problem:    Severe sepsis (HCC) Active Problems:   CAP (community acquired pneumonia)    Discharge Instructions   Allergies as of 12/03/2023       Reactions   Augmentin [amoxicillin-pot Clavulanate] Shortness Of Breath, Swelling, Rash   No problems with amox        Medication List     TAKE these medications    amLODipine  5 MG tablet Commonly known as: NORVASC  Take 1 tablet (5 mg total) by mouth daily.   cefdinir 300 MG capsule Commonly known as: OMNICEF Take 1 capsule (300 mg total) by mouth 2 (two) times daily for 5 days.   pilocarpine  1 % ophthalmic solution Commonly known as: PILOCAR Place 1 drop into the left eye 2 (two) times daily.        Follow-up Information     Rilla Baller, MD Follow up in 1 week(s).   Specialty: Family Medicine Contact information: [address] [place] [address] [phone]                Allergies  Allergen Reactions   Augmentin [Amoxicillin-Pot Clavulanate] Shortness Of Breath, Swelling and Rash    No problems with amox    Consultations: None   Procedures/Studies: DG Chest 2 View Result Date: 12/01/2023 CLINICAL DATA:  Possible sepsis EXAM: CHEST - 2 VIEW COMPARISON:  03/03/2021 FINDINGS: Heterogeneous ground-glass opacity within the left lung, suspicious for pneumonia. No pleural effusion. Normal cardiac size. No pneumothorax IMPRESSION: Heterogeneous ground-glass opacity within the left lung, suspicious for pneumonia. Electronically Signed   By: Luke Bun [name]   On: 12/01/2023 16:05     Discharge Exam: Vitals:   12/03/23 0407 12/03/23 0850  BP: 111/89   Pulse: 87   Resp: 19   Temp: 98.6 F (37 C)   SpO2: 92% 95%   Vitals:   12/02/23 1224 12/02/23 2138 12/03/23 0407 12/03/23 0850  BP: 120/74 122/77 111/89   Pulse: 81 80 87   Resp: 20 20 19    Temp: 98 F (36.7 C) 98 F (36.7 C) 98.6 F (37 C)   TempSrc: Oral Oral Oral   SpO2: 94% 97% 92% 95%  Weight:      Height:        General: Pt  is alert, awake, not in acute distress Cardiovascular: RRR, S1/S2 +, no rubs, no gallops Respiratory: CTA bilaterally, no wheezing, no rhonchi Abdominal: Soft, NT, ND, bowel sounds + Extremities: no edema, no cyanosis    The results of significant diagnostics from this hospitalization (including imaging, microbiology, ancillary and laboratory) are listed below for reference.     Microbiology: Recent Results (from the past 240 hours)  Blood Culture (routine x 2)     Status: None (Preliminary result)   Collection Time: 12/01/23 12:01 PM   Specimen: BLOOD  Result Value Ref Range Status   Specimen Description   Final    BLOOD LEFT ANTECUBITAL Performed at [facility], [address] [place], [place] [address]  Special Requests   Final    Blood Culture adequate volume BOTTLES DRAWN AEROBIC AND ANAEROBIC Performed at [facility], [address] [place], [place] [address]    Culture   Final    NO GROWTH 2 DAYS Performed at [facility], [address], [place], [place] [address]    Report Status PENDING  Incomplete  Blood Culture (routine x 2)     Status: None (Preliminary result)   Collection Time: 12/01/23 12:06 PM   Specimen: BLOOD LEFT FOREARM  Result Value Ref Range Status   Specimen Description   Final    BLOOD LEFT FOREARM Performed at [facility], [address], [place], [place] [address]    Special Requests   Final    Blood Culture adequate volume BOTTLES DRAWN AEROBIC AND ANAEROBIC Performed at [facility], [address] [place], [place] [address]    Culture   Final    NO GROWTH 2 DAYS Performed at [facility], [address] [place], [place] [address]    Report Status PENDING  Incomplete  Resp panel by RT-PCR (RSV, Flu A&B, Covid) Peripheral     Status: None   Collection Time: 12/01/23 12:21 PM   Specimen: Peripheral; Nasal Swab  Result Value Ref Range Status   SARS  Coronavirus 2 by RT PCR NEGATIVE NEGATIVE Final    Comment: (NOTE) SARS-CoV-2 target nucleic acids are NOT DETECTED.  The SARS-CoV-2 RNA is generally detectable in upper respiratory specimens during the acute phase of infection. The lowest concentration of SARS-CoV-2 viral copies this assay can detect is 138 copies/mL. A negative result does not preclude SARS-Cov-2 infection and should not be used as the sole basis for treatment or other patient management decisions. A negative result may occur with  improper specimen collection/handling, submission of specimen other than nasopharyngeal swab, presence of viral mutation(s) within the areas targeted by this assay, and inadequate number of viral copies(<138 copies/mL). A negative result must be combined with clinical observations, patient history, and epidemiological information. The expected result is Negative.  Fact Sheet for Patients:  BloggerCourse.com  Fact Sheet for Healthcare Providers:  SeriousBroker.it  This test is no t yet approved or cleared by the [place]  FDA and  has been authorized for detection and/or diagnosis of SARS-CoV-2 by FDA under an Emergency Use Authorization (EUA). This EUA will remain  in effect (meaning this test can be used) for the duration of the COVID-19 declaration under Section 564(b)(1) of the Act, 21 U.S.C.section 360bbb-3(b)(1), unless the authorization is terminated  or revoked sooner.       Influenza A by PCR NEGATIVE NEGATIVE Final   Influenza B by PCR NEGATIVE NEGATIVE Final    Comment: (NOTE) The Xpert Xpress SARS-CoV-2/FLU/RSV plus assay is intended as an aid in the diagnosis of influenza from Nasopharyngeal swab specimens and should not be used as a sole basis for treatment. Nasal washings and aspirates are unacceptable for Xpert Xpress SARS-CoV-2/FLU/RSV testing.  Fact Sheet for  Patients: BloggerCourse.com  Fact Sheet for Healthcare Providers: SeriousBroker.it  This test is not yet approved or cleared by the [place]  FDA and has been authorized for detection and/or diagnosis of SARS-CoV-2 by FDA under an Emergency Use Authorization (EUA). This EUA will remain in effect (meaning this test can be used) for the duration of the COVID-19 declaration under Section 564(b)(1) of the Act, 21 U.S.C. section 360bbb-3(b)(1), unless the authorization is terminated or revoked.  Resp Syncytial Virus by PCR NEGATIVE NEGATIVE Final    Comment: (NOTE) Fact Sheet for Patients: BloggerCourse.com  Fact Sheet for Healthcare Providers: SeriousBroker.it  This test is not yet approved or cleared by the [place]  FDA and has been authorized for detection and/or diagnosis of SARS-CoV-2 by FDA under an Emergency Use Authorization (EUA). This EUA will remain in effect (meaning this test can be used) for the duration of the COVID-19 declaration under Section 564(b)(1) of the Act, 21 U.S.C. section 360bbb-3(b)(1), unless the authorization is terminated or revoked.  Performed at [facility], [address] [place], [place] [address]   Expectorated Sputum Assessment w Gram Stain, Rflx to Resp Cult     Status: None   Collection Time: 12/02/23  2:40 PM   Specimen: Expectorated Sputum  Result Value Ref Range Status   Specimen Description EXPECTORATED SPUTUM  Final   Special Requests NONE  Final   Sputum evaluation   Final    Sputum specimen not acceptable for testing.  Please recollect.   LYNA CROME RN @ 1536 12/02/23 CAL Performed at [facility], [address], [place], [place] [address]    Report Status 12/02/2023 FINAL  Final     Labs: BNP (last 3 results) No results for input(s): BNP in the last 8760 hours. Basic  Metabolic Panel: Recent Labs  Lab 12/01/23 1340 12/01/23 1431 12/01/23 1700 12/01/23 2052 12/02/23 0433 12/03/23 0857  NA 126*  --  127* 131* 129* 133*  K 2.8*  --  4.6 3.7 3.2* 3.5  CL 90*  --   --  98 96* 102  CO2 20*  --   --  24 22 23   GLUCOSE 139*  --   --  117* 113* 114*  BUN 20  --   --  19 20 15   CREATININE 0.93  --   --  0.90 0.91 0.71  CALCIUM 8.7*  --   --  8.2* 8.1* 8.2*  MG  --  1.6*  --   --   --   --    Liver Function Tests: Recent Labs  Lab 12/01/23 1340 12/02/23 0433  AST 35 35  ALT 31 29  ALKPHOS 58 48  BILITOT 2.2* 2.1*  PROT 6.0* 5.5*  ALBUMIN 3.2* 2.8*   No results for input(s): LIPASE, AMYLASE in the last 168 hours. No results for input(s): AMMONIA in the last 168 hours. CBC: Recent Labs  Lab 12/01/23 1340 12/01/23 1700 12/01/23 2052 12/02/23 0433 12/03/23 0857  WBC 9.7  --  9.3 9.0 5.9  NEUTROABS 12.2*  --   --   --  3.4  HGB 17.7* 12.2* 14.3 12.8* 12.6*  HCT 47.8 36.0* 39.1 35.9* 36.2*  MCV 92.6  --  95.4 95.5 97.6  PLT 176  --  183 173 200   Cardiac Enzymes: No results for input(s): CKTOTAL, CKMB, CKMBINDEX, TROPONINI in the last 168 hours. BNP: Invalid input(s): POCBNP CBG: No results for input(s): GLUCAP in the last 168 hours. D-Dimer No results for input(s): DDIMER in the last 72 hours. Hgb A1c No results for input(s): HGBA1C in the last 72 hours. Lipid Profile No results for input(s): CHOL, HDL, LDLCALC, TRIG, CHOLHDL, LDLDIRECT in the last 72 hours. Thyroid  function studies No results for input(s): TSH, T4TOTAL, T3FREE, THYROIDAB in the last 72 hours.  Invalid input(s): FREET3 Anemia work up No results for input(s): VITAMINB12, FOLATE, FERRITIN, TIBC, IRON, RETICCTPCT in the last 72 hours. Urinalysis  Component Value Date/Time   COLORURINE YELLOW 12/01/2023 1221   APPEARANCEUR CLEAR 12/01/2023 1221   LABSPEC 1.009 12/01/2023 1221   PHURINE 6.0 12/01/2023 1221    GLUCOSEU NEGATIVE 12/01/2023 1221   HGBUR NEGATIVE 12/01/2023 1221   BILIRUBINUR NEGATIVE 12/01/2023 1221   BILIRUBINUR negative 03/03/2021 1047   KETONESUR NEGATIVE 12/01/2023 1221   PROTEINUR NEGATIVE 12/01/2023 1221   UROBILINOGEN 0.2 03/03/2021 1047   NITRITE NEGATIVE 12/01/2023 1221   LEUKOCYTESUR NEGATIVE 12/01/2023 1221   Sepsis Labs Recent Labs  Lab 12/01/23 1340 12/01/23 2052 12/02/23 0433 12/03/23 0857  WBC 9.7 9.3 9.0 5.9   Microbiology Recent Results (from the past 240 hours)  Blood Culture (routine x 2)     Status: None (Preliminary result)   Collection Time: 12/01/23 12:01 PM   Specimen: BLOOD  Result Value Ref Range Status   Specimen Description   Final    BLOOD LEFT ANTECUBITAL Performed at [facility], [address], [place], [place] [address]    Special Requests   Final    Blood Culture adequate volume BOTTLES DRAWN AEROBIC AND ANAEROBIC Performed at [facility], [address] [place], [place] [address]    Culture   Final    NO GROWTH 2 DAYS Performed at [facility], [address] [place], [place] [address]    Report Status PENDING  Incomplete  Blood Culture (routine x 2)     Status: None (Preliminary result)   Collection Time: 12/01/23 12:06 PM   Specimen: BLOOD LEFT FOREARM  Result Value Ref Range Status   Specimen Description   Final    BLOOD LEFT FOREARM Performed at [facility], [address], [place], [place] [address]    Special Requests   Final    Blood Culture adequate volume BOTTLES DRAWN AEROBIC AND ANAEROBIC Performed at [facility], [address], [place], [place] [address]    Culture   Final    NO GROWTH 2 DAYS Performed at [facility], [address] [place], [place] [address]    Report Status PENDING  Incomplete  Resp panel by RT-PCR (RSV, Flu A&B, Covid) Peripheral     Status: None   Collection Time: 12/01/23 12:21 PM   Specimen:  Peripheral; Nasal Swab  Result Value Ref Range Status   SARS Coronavirus 2 by RT PCR NEGATIVE NEGATIVE Final    Comment: (NOTE) SARS-CoV-2 target nucleic acids are NOT DETECTED.  The SARS-CoV-2 RNA is generally detectable in upper respiratory specimens during the acute phase of infection. The lowest concentration of SARS-CoV-2 viral copies this assay can detect is 138 copies/mL. A negative result does not preclude SARS-Cov-2 infection and should not be used as the sole basis for treatment or other patient management decisions. A negative result may occur with  improper specimen collection/handling, submission of specimen other than nasopharyngeal swab, presence of viral mutation(s) within the areas targeted by this assay, and inadequate number of viral copies(<138 copies/mL). A negative result must be combined with clinical observations, patient history, and epidemiological information. The expected result is Negative.  Fact Sheet for Patients:  BloggerCourse.com  Fact Sheet for Healthcare Providers:  SeriousBroker.it  This test is no t yet approved or cleared by the [place]  FDA and  has been authorized for detection and/or diagnosis of SARS-CoV-2 by FDA under an Emergency Use Authorization (EUA). This EUA will remain  in effect (meaning this test can be used) for the duration of the COVID-19 declaration  under Section 564(b)(1) of the Act, 21 U.S.C.section 360bbb-3(b)(1), unless the authorization is terminated  or revoked sooner.       Influenza A by PCR NEGATIVE NEGATIVE Final   Influenza B by PCR NEGATIVE NEGATIVE Final    Comment: (NOTE) The Xpert Xpress SARS-CoV-2/FLU/RSV plus assay is intended as an aid in the diagnosis of influenza from Nasopharyngeal swab specimens and should not be used as a sole basis for treatment. Nasal washings and aspirates are unacceptable for Xpert Xpress  SARS-CoV-2/FLU/RSV testing.  Fact Sheet for Patients: BloggerCourse.com  Fact Sheet for Healthcare Providers: SeriousBroker.it  This test is not yet approved or cleared by the [place]  FDA and has been authorized for detection and/or diagnosis of SARS-CoV-2 by FDA under an Emergency Use Authorization (EUA). This EUA will remain in effect (meaning this test can be used) for the duration of the COVID-19 declaration under Section 564(b)(1) of the Act, 21 U.S.C. section 360bbb-3(b)(1), unless the authorization is terminated or revoked.     Resp Syncytial Virus by PCR NEGATIVE NEGATIVE Final    Comment: (NOTE) Fact Sheet for Patients: BloggerCourse.com  Fact Sheet for Healthcare Providers: SeriousBroker.it  This test is not yet approved or cleared by the [place]  FDA and has been authorized for detection and/or diagnosis of SARS-CoV-2 by FDA under an Emergency Use Authorization (EUA). This EUA will remain in effect (meaning this test can be used) for the duration of the COVID-19 declaration under Section 564(b)(1) of the Act, 21 U.S.C. section 360bbb-3(b)(1), unless the authorization is terminated or revoked.  Performed at [facility], [address], [place], [place] [address]   Expectorated Sputum Assessment w Gram Stain, Rflx to Resp Cult     Status: None   Collection Time: 12/02/23  2:40 PM   Specimen: Expectorated Sputum  Result Value Ref Range Status   Specimen Description EXPECTORATED SPUTUM  Final   Special Requests NONE  Final   Sputum evaluation   Final    Sputum specimen not acceptable for testing.  Please recollect.   LYNA CROME RN @ 1536 12/02/23 CAL Performed at [facility], [address] [place], [place] [address]    Report Status 12/02/2023 FINAL  Final    FURTHER DISCHARGE INSTRUCTIONS:   Get Medicines  reviewed and adjusted: Please take all your medications with you for your next visit with your Primary MD   Laboratory/radiological data: Please request your Primary MD to go over all hospital tests and procedure/radiological results at the follow up, please ask your Primary MD to get all Hospital records sent to his/her office.   In some cases, they will be blood work, cultures and biopsy results pending at the time of your discharge. Please request that your primary care [name] goes through all the records of your hospital data and follows up on these results.   Also Note the following: If you experience worsening of your admission symptoms, develop shortness of breath, life threatening emergency, suicidal or homicidal thoughts you must seek medical attention immediately by calling 911 or calling your MD immediately  if symptoms less severe.   You must read complete instructions/literature along with all the possible adverse reactions/side effects for all the Medicines you take and that have been prescribed to you. Take any new Medicines after you have completely understood and accpet all the possible adverse reactions/side effects.    patient was instructed, not to drive, operate heavy machinery, perform activities at heights, swimming or participation in water  activities  or provide baby-sitting services while on Pain, Sleep and Anxiety Medications; until their outpatient Physician has advised to do so again. Also recommended to not to take more than prescribed Pain, Sleep and Anxiety Medications.  It is not advisable to combine anxiety, sleep and pain medications without talking with your primary care provider.     Wear Seat belts while driving.   Please note: You were cared for by a hospitalist during your hospital stay. Once you are discharged, your primary care physician will handle any further medical issues. Please note that NO REFILLS for any discharge medications will be authorized once  you are discharged, as it is imperative that you return to your primary care physician (or establish a relationship with a primary care physician if you do not have one) for your post hospital discharge needs so that they can reassess your need for medications and monitor your lab values  Time coordinating discharge: Over 30 minutes  SIGNED:   Fredia Skeeter, MD  [facility] 12/03/2023, 10:37 AM *Please note that this is a verbal dictation therefore any spelling or grammatical errors are due to the Dragon Medical One system interpretation. If 7PM-7AM, please contact night-coverage [email or web address]

## 2023-12-03 NOTE — Progress Notes (Signed)
 AVS given to patient and explained at the bedside. Medications and follow up appointments have been explained with pt verbalizing understanding.

## 2023-12-04 ENCOUNTER — Telehealth: Payer: Self-pay

## 2023-12-04 LAB — LEGIONELLA PNEUMOPHILA SEROGP 1 UR AG: L. pneumophila Serogp 1 Ur Ag: POSITIVE — AB

## 2023-12-04 NOTE — Patient Instructions (Signed)
 Visit Information  Thank you for taking time to visit with me today. Please don't hesitate to contact me if I can be of assistance to you before our next scheduled telephone appointment.  Our next appointment is by telephone on December 13, 2023 at 11:00 am  Following is a copy of your care plan:   Goals Addressed             This Visit's Progress    VBCI Transitions of Care (TOC) Care Plan       Problems:  Recent Hospitalization for treatment of Pulmonary Disease and Severe Sepsis due to pneumonia Knowledge Deficit Related to Legionella pneumonia (patient states MD - Dr. Vernon called and stated a call in for Erythomycin  Goal:  Over the next 30 days, the patient will not experience hospital readmission  Interventions:  Transitions of Care: Doctor Visits  - discussed the importance of doctor visits Reviewed Signs and symptoms of infection  Patient Self Care Activities:  Attend all scheduled provider appointments Call pharmacy for medication refills 3-7 days in advance of running out of medications Call provider office for new concerns or questions  Notify RN Care Manager of [facility] call rescheduling needs Participate in Transition of Care Program/Attend [facility] scheduled calls  Plan:  The care management team will reach out to the patient again over the next 7-10 days.        Patient verbalizes understanding of instructions and care plan provided today and agrees to view in MyChart. Active MyChart status and patient understanding of how to access instructions and care plan via MyChart confirmed with patient.     Telephone follow up appointment with care management team member scheduled for: The patient has been provided with contact information for the care management team and has been advised to call with any health related questions or concerns.   Please call the care guide team at [phone] if you need to cancel or reschedule your appointment.   Please call the [place]  National  Suicide Prevention Lifeline: 854-483-2368 or TTY: 504 259 2798 TTY [phone]) to talk to a trained counselor if you are experiencing a Mental Health or Behavioral Health Crisis or need someone to talk to.  Richerd Fish, RN, BSN, CCM [facility], [facility] RN Care Manager Direct Dial: [phone]

## 2023-12-04 NOTE — Transitions of Care (Post Inpatient/ED Visit) (Signed)
   12/04/2023  Name: Michael Vaughan MRN: [id number] DOB: 03/18/1966  Today's TOC FU Call Status: Today's TOC FU Call Status:: Successful TOC FU Call Completed TOC FU Call Complete Date: 12/04/23 Patient's Name and Date of Birth confirmed.  Transition Care Management Follow-up Telephone Call Date of Discharge: 12/03/23 Discharge Facility: [facility]) Type of Discharge: Inpatient Admission Primary Inpatient Discharge Diagnosis:: Sepsis Pneumonia How have you been since you were released from the hospital?: Better Any questions or concerns?: No  Items Reviewed: Did you receive and understand the discharge instructions provided?: Yes Medications obtained,verified, and reconciled?: Yes (Medications Reviewed)  Medications Reviewed Today: Medications Reviewed Today     Reviewed by Eilleen Richerd GRADE, RN (Registered Nurse) on 12/04/23 at 1156  Med List Status: <None>   Medication Order Taking? Sig Documenting Provider Last Dose Status Informant  amLODipine  (NORVASC ) 5 MG tablet [id number] Yes Take 1 tablet (5 mg total) by mouth daily. Rilla Baller, MD  Active Spouse/Significant Other  cefdinir (OMNICEF) 300 MG capsule [id number] Yes Take 1 capsule (300 mg total) by mouth 2 (two) times daily for 5 days. Pahwani, Ravi, MD  Active   pilocarpine  (PILOCAR) 1 % ophthalmic solution [id number] Yes Place 1 drop into the left eye 2 (two) times daily.  [provider]  Active Spouse/Significant Other               Follow up appointments reviewed: December 08, 2023 with Dr. Rilla    SDOH Interventions Today    Flowsheet Row Most Recent Value  SDOH Interventions   Food Insecurity Interventions Intervention Not Indicated  Housing Interventions Intervention Not Indicated  Transportation Interventions Intervention Not Indicated  Utilities Interventions Intervention Not Indicated     Goals Addressed             This Visit's Progress    VBCI Transitions of Care (TOC) Care  Plan       Problems:  Recent Hospitalization for treatment of Pulmonary Disease and Severe Sepsis due to pneumonia Knowledge Deficit Related to Legionella pneumonia (patient states MD - Dr. Vernon called and stated a call in for Erythomycin  Goal:  Over the next 30 days, the patient will not experience hospital readmission  Interventions:  Transitions of Care: Doctor Visits  - discussed the importance of doctor visits Reviewed Signs and symptoms of infection  Patient Self Care Activities:  Attend all scheduled provider appointments Call pharmacy for medication refills 3-7 days in advance of running out of medications Call provider office for new concerns or questions  Notify RN Care Manager of [facility] call rescheduling needs Participate in Transition of Care Program/Attend [facility] scheduled calls  Plan:  The care management team will reach out to the patient again over the next 7-10 days.        Richerd Eilleen, RN, BSN, CCM [facility], [facility] RN Care Manager Direct Dial: [phone]

## 2023-12-04 NOTE — Progress Notes (Signed)
 Received patient's urine antigen for Legionella result which was positive.  Called and patient's pharmacy/[facility] and called in prescription for azithromycin  500 mg p.o. daily for 4 days.  Also called patient and discussed the results with him and advised him to pick up the prescription and start taking that along with cefdinir that was prescribed to him and he was already taking that.

## 2023-12-06 ENCOUNTER — Ambulatory Visit: Payer: Self-pay | Admitting: Family Medicine

## 2023-12-06 LAB — CULTURE, BLOOD (ROUTINE X 2)
Culture: NO GROWTH
Culture: NO GROWTH
Special Requests: ADEQUATE
Special Requests: ADEQUATE

## 2023-12-08 ENCOUNTER — Ambulatory Visit: Admitting: Family Medicine

## 2023-12-08 ENCOUNTER — Encounter: Payer: Self-pay | Admitting: Family Medicine

## 2023-12-08 VITALS — BP 124/78 | HR 85 | Temp 98.3°F | Ht 70.0 in | Wt 192.2 lb

## 2023-12-08 DIAGNOSIS — R197 Diarrhea, unspecified: Secondary | ICD-10-CM | POA: Diagnosis not present

## 2023-12-08 DIAGNOSIS — A481 Legionnaires' disease: Secondary | ICD-10-CM | POA: Diagnosis not present

## 2023-12-08 DIAGNOSIS — J189 Pneumonia, unspecified organism: Secondary | ICD-10-CM | POA: Diagnosis not present

## 2023-12-08 HISTORY — DX: Legionnaires' disease: A48.1

## 2023-12-08 LAB — CBC WITH DIFFERENTIAL/PLATELET
Basophils Absolute: 0.1 10*3/uL (ref 0.0–0.1)
Basophils Relative: 0.8 % (ref 0.0–3.0)
Eosinophils Absolute: 0.3 10*3/uL (ref 0.0–0.7)
Eosinophils Relative: 2.7 % (ref 0.0–5.0)
HCT: 40.7 % (ref 39.0–52.0)
Hemoglobin: 14.2 g/dL (ref 13.0–17.0)
Lymphocytes Relative: 19.2 % (ref 12.0–46.0)
Lymphs Abs: 2.2 10*3/uL (ref 0.7–4.0)
MCHC: 34.9 g/dL (ref 30.0–36.0)
MCV: 96.7 fl (ref 78.0–100.0)
Monocytes Absolute: 0.5 10*3/uL (ref 0.1–1.0)
Monocytes Relative: 4.6 % (ref 3.0–12.0)
Neutro Abs: 8.4 10*3/uL — ABNORMAL HIGH (ref 1.4–7.7)
Neutrophils Relative %: 72.7 % (ref 43.0–77.0)
Platelets: 544 10*3/uL — ABNORMAL HIGH (ref 150.0–400.0)
RBC: 4.21 Mil/uL — ABNORMAL LOW (ref 4.22–5.81)
RDW: 13.2 % (ref 11.5–15.5)
WBC: 11.5 10*3/uL — ABNORMAL HIGH (ref 4.0–10.5)

## 2023-12-08 LAB — COMPREHENSIVE METABOLIC PANEL WITH GFR
ALT: 61 U/L — ABNORMAL HIGH (ref 0–53)
AST: 47 U/L — ABNORMAL HIGH (ref 0–37)
Albumin: 3.6 g/dL (ref 3.5–5.2)
Alkaline Phosphatase: 69 U/L (ref 39–117)
BUN: 8 mg/dL (ref 6–23)
CO2: 32 meq/L (ref 19–32)
Calcium: 8.9 mg/dL (ref 8.4–10.5)
Chloride: 103 meq/L (ref 96–112)
Creatinine, Ser: 0.79 mg/dL (ref 0.40–1.50)
GFR: 97.84 mL/min (ref >60.00)
Glucose, Bld: 100 mg/dL — ABNORMAL HIGH (ref 70–99)
Potassium: 4.1 meq/L (ref 3.5–5.1)
Sodium: 140 meq/L (ref 135–145)
Total Bilirubin: 1 mg/dL (ref 0.2–1.2)
Total Protein: 6.1 g/dL (ref 6.0–8.3)

## 2023-12-08 LAB — MAGNESIUM: Magnesium: 2.1 mg/dL (ref 1.5–2.5)

## 2023-12-08 NOTE — Assessment & Plan Note (Signed)
 Rpt CXR 5 wks from now to ensure resolution.

## 2023-12-08 NOTE — Telephone Encounter (Addendum)
 Seen in f/u today - he is slowly recovering from Legionnaire's disease. Pt and I are appreciative that you sent him to the ER when you did - thank you!

## 2023-12-08 NOTE — Progress Notes (Signed)
 Ph: [phone] Fax: [phone]   Patient ID: Michael Vaughan, male    DOB: 17-Oct-1965, 58 y.o.   MRN: [id number]  This visit was conducted in person.  BP 124/78   Pulse 85   Temp 98.3 F (36.8 C) (Oral)   Ht 5' 10 (1.778 m)   Wt 192 lb 4 oz (87.2 kg)   SpO2 96%   BMI 27.59 kg/m    CC: hosp f/u visit  Subjective:   HPI: Michael Vaughan is a 58 y.o. male presenting on 12/08/2023 for Hospitalization Follow-up (Admitted on 12/01/23 at [facility], dx PNA of L lung due to infectious organism; acute respiratory failure w/hypoxia; sepsis. )   Recent hospitalization for progressive cough, fever, congestion, poor PO intake, found to have left lung community acquired pneumonia associated with acute respiratory failure with hypoxia and sepsis. Also had low sodium, potassium, magnesium  and other electrolyte abnormalities. These were improving prior to discharge.  Hospital records reviewed. Med rec performed.  Blcx no growth x2 final.  Legionella Ag positive - treated with rocephin  and azithromycin , discharged on cefdinir . He states he was called and cedfinir was changed to azithromycin  500mg  daily x 4 more days - last dose was yesterday. Also continues cefdinir .   Since home, staying fatigued but afebrile with improvement in cough.  He notes ongoing loose to watery diarrhea since he's been home, multiple times a day.   He notes he had been working on his water  line at the barn for many days.   Home health not set up.  Other follow up appointments scheduled: none.  Previously scheduled heme [facility]) 12/2023 ______________________________________________________________________ Hospital admission: 12/01/2023 Hospital discharge: 12/03/2023 TCM f/u phone call:  performed on 12/04/2023  Recommendations for Outpatient Follow-up:  Follow up with PCP in 1-2 weeks Please obtain BMP/CBC in one week Please follow up with your PCP on the following pending results: Uosm, UNa, legionella Ag, CBC UNa 18.  Uosm not available.   Discharge Diagnoses:  Principal Problem:   Severe sepsis (HCC) Active Problems:   CAP (community acquired pneumonia)     Relevant past medical, surgical, family and social history reviewed and updated as indicated. Interim medical history since our last visit reviewed. Allergies and medications reviewed and updated. Outpatient Medications Prior to Visit  Medication Sig Dispense Refill   amLODipine  (NORVASC ) 5 MG tablet Take 1 tablet (5 mg total) by mouth daily. 90 tablet 3   cefdinir  (OMNICEF ) 300 MG capsule Take 1 capsule (300 mg total) by mouth 2 (two) times daily for 5 days. 10 capsule 0   pilocarpine  (PILOCAR) 1 % ophthalmic solution Place 1 drop into the left eye 2 (two) times daily.      No facility-administered medications prior to visit.     Per HPI unless specifically indicated in ROS section below Review of Systems  Objective:  BP 124/78   Pulse 85   Temp 98.3 F (36.8 C) (Oral)   Ht 5' 10 (1.778 m)   Wt 192 lb 4 oz (87.2 kg)   SpO2 96%   BMI 27.59 kg/m   Wt Readings from Last 3 Encounters:  12/08/23 192 lb 4 oz (87.2 kg)  12/01/23 210 lb (95.3 kg)  06/28/23 200 lb 11.2 oz (91 kg)      Physical Exam Vitals and nursing note reviewed.  Constitutional:      Appearance: Normal appearance. He is not ill-appearing.  HENT:     Head: Normocephalic and atraumatic.     Mouth/Throat:  Mouth: Mucous membranes are moist.     Pharynx: Oropharynx is clear. No oropharyngeal exudate or posterior oropharyngeal erythema.   Eyes:     Conjunctiva/sclera: Conjunctivae normal.    Cardiovascular:     Rate and Rhythm: Normal rate and regular rhythm.     Pulses: Normal pulses.     Heart sounds: Normal heart sounds. No murmur heard. Pulmonary:     Effort: Pulmonary effort is normal. No respiratory distress.     Breath sounds: No wheezing, rhonchi or rales.     Comments: Diminished breath sounds on left lower lung otherwise clear  Musculoskeletal:      Cervical back: Normal range of motion and neck supple.     Right lower leg: No edema.     Left lower leg: No edema.   Skin:    General: Skin is warm and dry.     Findings: No rash.   Neurological:     Mental Status: He is alert.   Psychiatric:        Mood and Affect: Mood normal.        Behavior: Behavior normal.       Lab Results  Component Value Date   NA 133 (L) 12/03/2023   CL 102 12/03/2023   K 3.5 12/03/2023   CO2 23 12/03/2023   BUN 15 12/03/2023   CREATININE 0.71 12/03/2023   GFRNONAA >60 12/03/2023   CALCIUM 8.2 (L) 12/03/2023   ALBUMIN 2.8 (L) 12/02/2023   GLUCOSE 114 (H) 12/03/2023    Lab Results  Component Value Date   WBC 5.9 12/03/2023   HGB 12.6 (L) 12/03/2023   HCT 36.2 (L) 12/03/2023   MCV 97.6 12/03/2023   PLT 200 12/03/2023    Lab Results  Component Value Date   ALT 29 12/02/2023   AST 35 12/02/2023   ALKPHOS 48 12/02/2023   BILITOT 2.1 (H) 12/02/2023    Assessment & Plan:   Problem List Items Addressed This Visit     CAP (community acquired pneumonia)   Rpt CXR 5 wks from now to ensure resolution.       Relevant Orders   DG Chest 2 View   Legionnaire's disease [facility]) - Primary   Reviewed diagnosis, recent course of illness, and treatment.  Reviewed likely location where he acquired illness - his barn's water  line. He is planning on hiring professionals to repair.  He did complete 5 days of azithromycin  500mg .  Notes ongoing slow but steady improvement.  Update labs today, rpt CXR in 5 wks.  Reviewed red flags to seek further care, and he is to notify us  if not improving as expected.       Relevant Orders   CBC with Differential/Platelet   Comprehensive metabolic panel with GFR   Magnesium    DG Chest 2 View   Diarrhea   Post antibiotic loose stools/diarrhea, r/o hospital acquired infection.  Recommend actimel probiotic, vs phillips colon health or other comparable.  Update if ongoing diarrhea after finishing abx despite  this - to return for C diff testing.         No orders of the defined types were placed in this encounter.   Orders Placed This Encounter  Procedures   DG Chest 2 View    Standing Status:   Future    Expiration Date:   12/07/2024    Reason for Exam (SYMPTOM  OR DIAGNOSIS REQUIRED):   f/u legionnaire's disease L CAP    Preferred imaging location?:  [facility] [place] [facility]   CBC with Differential/Platelet   Comprehensive metabolic panel with GFR   Magnesium     Patient Instructions  Labs today  Your hospital testing returned positive for legionnaire's disease - which is a cause of pneumonia.  The azithromycin  is the best treatment for this infection. After finishing antibiotics, start probiotic like Actimel yogurt or phillips colon health pill form.  Let us  know if ongoing diarrhea despite finishing antibiotics and diet returning to normal - to do a stool test rule out antibiotic associated diarrhea/colon infection.  Return in 5 weeks to repeat chest xray - you may walk in between 10-12pm or 2-4pm for xray.   Follow up plan: Return if symptoms worsen or fail to improve.  Anton Blas, MD

## 2023-12-08 NOTE — Patient Instructions (Addendum)
 Labs today  Your hospital testing returned positive for legionnaire's disease - which is a cause of pneumonia.  The azithromycin  is the best treatment for this infection. After finishing antibiotics, start probiotic like Actimel yogurt or phillips colon health pill form.  Let us  know if ongoing diarrhea despite finishing antibiotics and diet returning to normal - to do a stool test rule out antibiotic associated diarrhea/colon infection.  Return in 5 weeks to repeat chest xray - you may walk in between 10-12pm or 2-4pm for xray.

## 2023-12-08 NOTE — Assessment & Plan Note (Signed)
 Post antibiotic loose stools/diarrhea, r/o hospital acquired infection.  Recommend actimel probiotic, vs phillips colon health or other comparable.  Update if ongoing diarrhea after finishing abx despite this - to return for C diff testing.

## 2023-12-08 NOTE — Assessment & Plan Note (Addendum)
 Reviewed diagnosis, recent course of illness, and treatment.  Reviewed likely location where he acquired illness - his barn's water  line. He is planning on hiring professionals to repair.  He did complete 5 days of azithromycin  500mg .  Notes ongoing slow but steady improvement.  Update labs today, rpt CXR in 5 wks.  Reviewed red flags to seek further care, and he is to notify us  if not improving as expected.

## 2023-12-13 ENCOUNTER — Ambulatory Visit: Payer: Self-pay | Admitting: Family Medicine

## 2023-12-13 ENCOUNTER — Telehealth: Payer: Self-pay

## 2023-12-13 NOTE — Patient Instructions (Signed)
 Visit Information  Thank you for taking time to visit with me today. Please don't hesitate to contact me if I can be of assistance to you before our next scheduled telephone appointment.  Our next appointment is by telephone on December 20, 2023 at 11:00 am  Following is a copy of your care plan:   Goals Addressed             This Visit's Progress    VBCI Transitions of Care (TOC) Care Plan       Problems:  Recent Hospitalization for treatment of Pulmonary Disease and Severe Sepsis due to pneumonia Knowledge Deficit Related to Legionella pneumonia (patient states MD - Dr. Vernon called and stated a call in for Erythomycin - completed routine  Goal:  Over the next 30 days, the patient will not experience hospital readmission  Interventions:  Transitions of Care: Doctor Visits  - discussed the importance of doctor visits Reviewed Signs and symptoms of infection  Patient Self Care Activities:  Attend all scheduled provider appointments Call pharmacy for medication refills 3-7 days in advance of running out of medications Call provider office for new concerns or questions  Notify RN Care Manager of [facility] call rescheduling needs Participate in Transition of Care Program/Attend TOC scheduled calls Taking a probeotic  Plan:  The care management team will reach out to the patient again over the next 7-10 days. Caregiver to wife, take breaks        Patient verbalizes understanding of instructions and care plan provided today and agrees to view in MyChart. Active MyChart status and patient understanding of how to access instructions and care plan via MyChart confirmed with patient.     The patient has been provided with contact information for the care management team and has been advised to call with any health related questions or concerns.  We have been unable to make contact with the patient for follow up. The care management team is available to follow up with the patient after  provider conversation with the patient regarding recommendation for care management engagement and subsequent re-referral to the care management team.  Next AWV (Annual Wellness Visit) scheduled for:   Please call the care guide team at [phone] if you need to cancel or reschedule your appointment.   Please call the [place]  National Suicide Prevention Lifeline: 641-049-4546 or TTY: 212-137-9591 TTY (251)511-3034) to talk to a trained counselor if you are experiencing a Mental Health or Behavioral Health Crisis or need someone to talk to.   Richerd Fish, RN, BSN, CCM [facility], [facility] RN Care Manager Direct Dial: [phone]

## 2023-12-13 NOTE — Transitions of Care (Post Inpatient/ED Visit) (Unsigned)
  Transition of Care week 2  Visit Note  12/13/2023  Name: Michael Vaughan MRN: [id number]          DOB: 12-Mar-1966  Situation: Patient enrolled in [facility] 30-day program. Visit completed with patient by telephone.   Background:   Initial Transition Care Management Follow-up Telephone Call    Past Medical History:  Diagnosis Date   Allergy    Arthralgia    takes aleve regularly   Arthritis    Cataract    Heartburn    Hereditary hemochromatosis (HCC) 05/2013   iron overload with mild HM, homozygous for C282Y, monthly blood draws   History of chicken pox    History of migraine 1990s   remote   Retinitis pigmentosa     Assessment: Patient Reported Symptoms: Cognitive Cognitive Status: Alert and oriented to person, place, and time      Neurological Neurological Review of Symptoms: No symptoms reported Neurological Self-Management Outcome: 4 (good)  HEENT HEENT Symptoms Reported: No symptoms reported HEENT Self-Management Outcome: 4 (good)    Cardiovascular Cardiovascular Symptoms Reported: No symptoms reported Does patient have uncontrolled Hypertension?: Yes Is patient checking Blood Pressure at home?: Yes Patient's Recent BP reading at home: 119/68 Cardiovascular Management Strategies: Activity, Adequate rest, Medication therapy Cardiovascular Self-Management Outcome: 4 (good)  Respiratory Respiratory Symptoms Reported: No symptoms reported Other Respiratory Symptoms: much better Respiratory Management Strategies: Activity, Adequate rest, Medication therapy Respiratory Self-Management Outcome: 4 (good)  Endocrine Endocrine Symptoms Reported: No symptoms reported Is patient diabetic?: No Endocrine Self-Management Outcome: 4 (good)  Gastrointestinal Gastrointestinal Symptoms Reported: No symptoms reported Additional Gastrointestinal Details: much better Gastrointestinal Self-Management Outcome: 4 (good)    Genitourinary Genitourinary Symptoms Reported: No symptoms  reported Genitourinary Self-Management Outcome: 4 (good)  Integumentary Integumentary Symptoms Reported: No symptoms reported    Musculoskeletal Musculoskelatal Symptoms Reviewed: Weakness, Joint pain Additional Musculoskeletal Details: HX of right hip replacement; right knee pain Musculoskeletal Self-Management Outcome: 3 (uncertain) Falls in the past year?: No Number of falls in past year: 1 or less Was there an injury with Fall?: No Fall Risk Category Calculator: 0 Patient Fall Risk Level: Low Fall Risk    Psychosocial Psychosocial Symptoms Reported: No symptoms reported Behavioral Health Self-Management Outcome: 4 (good)       Vitals:   12/13/23 1104  BP: 119/68    Medications Reviewed Today   Medications were not reviewed in this encounter     Recommendation:   {RECOMMENDATONS:32554}  Follow Up Plan:   {FOLLOWUP:32559}  SIG ***

## 2023-12-20 ENCOUNTER — Other Ambulatory Visit: Payer: Self-pay

## 2023-12-20 NOTE — Patient Instructions (Signed)
 Visit Information  Thank you for taking time to visit with me today. Please don't hesitate to contact me if I can be of assistance to you before our next scheduled telephone appointment.  Our next appointment is by telephone on 12/27/2023 at 1 PM  Following is a copy of your care plan:   Goals Addressed             This Visit's Progress    VBCI Transitions of Care (TOC) Care Plan   On track    Problems:  Recent Hospitalization for treatment of Pulmonary Disease and Severe Sepsis due to pneumonia Knowledge Deficit Related to Legionella pneumonia (patient states MD - Dr. 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  Goal:  Over the next 30 days, the patient will not experience hospital readmission  Interventions:  Transitions of Care: Doctor Visits  - discussed the importance of doctor visits Reviewed Signs and symptoms of infection 12/20/23 improving no coughing, completed antibiotic  Patient Self Care Activities:  Attend all scheduled provider appointments Call pharmacy for medication refills 3-7 days in advance of running out of medications Call provider office for new concerns or questions  Notify RN Care Manager of [facility] call rescheduling needs Participate in Transition of Care Program/Attend TOC scheduled calls Taking a probiotic - 12/20/23 Probiotics generic OTC [employer] brand working, no GI issues Bought disposable and N95 Mask from [employer] (while working in the yard and with chickens)  Plan:  The care management team will reach out to the patient again over the next 7-10 days. Caregiver to wife, take breaks, working on preserving energy and yet trying to do more        Patient verbalizes understanding of instructions and care plan provided today and agrees to view in MyChart. Active MyChart status and patient understanding of how to  access instructions and care plan via MyChart confirmed with patient.     Telephone follow up appointment with care management team member scheduled for: The patient will call for any new symptoms * as advised to follow up with PCP.  Next PCP appointment scheduled for:  Next AWV (Annual Wellness Visit) scheduled for:  12/26/23   Please call the care guide team at [phone] if you need to cancel or reschedule your appointment.   Please call the [place]  National Suicide Prevention Lifeline: (570) 702-6063 or TTY: 667-802-2993 TTY (934) 166-5515) to talk to a trained counselor if you are experiencing a Mental Health or Behavioral Health Crisis or need someone to talk to.  Richerd Fish, RN, BSN, CCM [facility], [facility] RN Care Manager Direct Dial: [phone]

## 2023-12-20 NOTE — Transitions of Care (Post Inpatient/ED Visit) (Unsigned)
  Transition of Care week 3  Visit Note  12/20/2023  Name: Michael Vaughan MRN: [id number]          DOB: Dec 24, 1965  Situation: Patient enrolled in [facility] 30-day program. Visit completed with patient by telephone.   Background:   Initial Transition Care Management Follow-up Telephone Call    Past Medical History:  Diagnosis Date   Allergy    Arthralgia    takes aleve regularly   Arthritis    Cataract    Heartburn    Hereditary hemochromatosis (HCC) 05/2013   iron overload with mild HM, homozygous for C282Y, monthly blood draws   History of chicken pox    History of migraine 1990s   remote   Retinitis pigmentosa     Assessment: Patient Reported Symptoms: Cognitive Cognitive Status: Able to follow simple commands   Health Maintenance Behaviors: Exercise, Social activities Health Facilitated by: Rest (Working on energy)  Neurological Neurological Review of Symptoms: No symptoms reported Neurological Self-Management Outcome: 4 (good)  HEENT HEENT Symptoms Reported: No symptoms reported HEENT Self-Management Outcome: 4 (good)    Cardiovascular Cardiovascular Symptoms Reported: No symptoms reported Does patient have uncontrolled Hypertension?: Yes Is patient checking Blood Pressure at home?: Yes Patient's Recent BP reading at home: 120/70 Cardiovascular Management Strategies: Activity, Adequate rest, Medication therapy Weight: 194 lb (88 kg) Cardiovascular Self-Management Outcome: 4 (good)  Respiratory Respiratory Symptoms Reported: No symptoms reported Other Respiratory Symptoms: no coughing - completed antibiotics Respiratory Management Strategies: Activity, Adequate rest, Medication therapy Respiratory Self-Management Outcome: 4 (good)  Endocrine Endocrine Symptoms Reported: No symptoms reported Is patient diabetic?: No Endocrine Self-Management Outcome: 4 (good)  Gastrointestinal Gastrointestinal Symptoms Reported: No symptoms reported Gastrointestinal Self-Management  Outcome: 4 (good)    Genitourinary Genitourinary Symptoms Reported: No symptoms reported Genitourinary Self-Management Outcome: 4 (good)  Integumentary Integumentary Symptoms Reported: No symptoms reported    Musculoskeletal Musculoskelatal Symptoms Reviewed: Weakness, Joint pain Additional Musculoskeletal Details: knee pain Musculoskeletal Self-Management Outcome: 3 (uncertain) Musculoskeletal Comment: getting around pretty well Falls in the past year?: No Number of falls in past year: 1 or less Was there an injury with Fall?: No Fall Risk Category Calculator: 0 Patient Fall Risk Level: Low Fall Risk    Psychosocial Psychosocial Symptoms Reported: No symptoms reported Behavioral Health Self-Management Outcome: 4 (good)       Vitals:   12/20/23 1107  BP: 120/70  SpO2: 94%    Medications Reviewed Today     Reviewed by Eilleen Richerd GRADE, RN (Registered Nurse) on 12/20/23 at 1104  Med List Status: <None>   Medication Order Taking? Sig Documenting Provider Last Dose Status Informant  amLODipine  (NORVASC ) 5 MG tablet [id number] Yes Take 1 tablet (5 mg total) by mouth daily. Rilla Baller, MD  Active Spouse/Significant Other  pilocarpine  [facility]) 1 % ophthalmic solution [id number] Yes Place 1 drop into the left eye 2 (two) times daily.  [provider]  Active Spouse/Significant Other  Probiotic Product (PROBIOTIC DAILY PO) [id number] Yes Take by mouth. [provider]  Active             Recommendation:   Continue Current Plan of Care  Richerd Eilleen, RN, BSN, CCM [facility], [facility] RN Care Manager Direct Dial: [phone]

## 2023-12-23 ENCOUNTER — Other Ambulatory Visit: Payer: Self-pay | Admitting: Family Medicine

## 2023-12-23 DIAGNOSIS — E038 Other specified hypothyroidism: Secondary | ICD-10-CM

## 2023-12-23 DIAGNOSIS — E785 Hyperlipidemia, unspecified: Secondary | ICD-10-CM

## 2023-12-23 DIAGNOSIS — I10 Essential (primary) hypertension: Secondary | ICD-10-CM

## 2023-12-23 DIAGNOSIS — Z125 Encounter for screening for malignant neoplasm of prostate: Secondary | ICD-10-CM

## 2023-12-25 ENCOUNTER — Other Ambulatory Visit (INDEPENDENT_AMBULATORY_CARE_PROVIDER_SITE_OTHER): Payer: Medicare PPO

## 2023-12-25 ENCOUNTER — Inpatient Hospital Stay: Payer: Medicare PPO | Attending: Hematology and Oncology

## 2023-12-25 ENCOUNTER — Inpatient Hospital Stay (HOSPITAL_BASED_OUTPATIENT_CLINIC_OR_DEPARTMENT_OTHER): Payer: Medicare PPO | Admitting: Hematology and Oncology

## 2023-12-25 ENCOUNTER — Other Ambulatory Visit: Payer: Self-pay | Admitting: Hematology and Oncology

## 2023-12-25 DIAGNOSIS — E038 Other specified hypothyroidism: Secondary | ICD-10-CM

## 2023-12-25 DIAGNOSIS — Z87891 Personal history of nicotine dependence: Secondary | ICD-10-CM | POA: Diagnosis not present

## 2023-12-25 DIAGNOSIS — Z800 Family history of malignant neoplasm of digestive organs: Secondary | ICD-10-CM | POA: Insufficient documentation

## 2023-12-25 DIAGNOSIS — I10 Essential (primary) hypertension: Secondary | ICD-10-CM

## 2023-12-25 DIAGNOSIS — Z806 Family history of leukemia: Secondary | ICD-10-CM | POA: Diagnosis not present

## 2023-12-25 DIAGNOSIS — E785 Hyperlipidemia, unspecified: Secondary | ICD-10-CM | POA: Diagnosis not present

## 2023-12-25 DIAGNOSIS — H3552 Pigmentary retinal dystrophy: Secondary | ICD-10-CM | POA: Insufficient documentation

## 2023-12-25 DIAGNOSIS — Z125 Encounter for screening for malignant neoplasm of prostate: Secondary | ICD-10-CM | POA: Diagnosis not present

## 2023-12-25 DIAGNOSIS — Z79899 Other long term (current) drug therapy: Secondary | ICD-10-CM | POA: Insufficient documentation

## 2023-12-25 DIAGNOSIS — Z803 Family history of malignant neoplasm of breast: Secondary | ICD-10-CM | POA: Insufficient documentation

## 2023-12-25 DIAGNOSIS — M129 Arthropathy, unspecified: Secondary | ICD-10-CM | POA: Insufficient documentation

## 2023-12-25 DIAGNOSIS — A481 Legionnaires' disease: Secondary | ICD-10-CM | POA: Insufficient documentation

## 2023-12-25 LAB — TSH: TSH: 4.04 u[IU]/mL (ref 0.35–5.50)

## 2023-12-25 LAB — CBC WITH DIFFERENTIAL (CANCER CENTER ONLY)
Abs Immature Granulocytes: 0.02 K/uL (ref 0.00–0.07)
Basophils Absolute: 0.1 K/uL (ref 0.0–0.1)
Basophils Relative: 1 %
Eosinophils Absolute: 0.4 K/uL (ref 0.0–0.5)
Eosinophils Relative: 6 %
HCT: 42.4 % (ref 39.0–52.0)
Hemoglobin: 15.2 g/dL (ref 13.0–17.0)
Immature Granulocytes: 0 %
Lymphocytes Relative: 30 %
Lymphs Abs: 1.9 K/uL (ref 0.7–4.0)
MCH: 33.9 pg (ref 26.0–34.0)
MCHC: 35.8 g/dL (ref 30.0–36.0)
MCV: 94.6 fL (ref 80.0–100.0)
Monocytes Absolute: 0.4 K/uL (ref 0.1–1.0)
Monocytes Relative: 7 %
Neutro Abs: 3.6 K/uL (ref 1.7–7.7)
Neutrophils Relative %: 56 %
Platelet Count: 218 K/uL (ref 150–400)
RBC: 4.48 MIL/uL (ref 4.22–5.81)
RDW: 13.1 % (ref 11.5–15.5)
WBC Count: 6.4 K/uL (ref 4.0–10.5)
nRBC: 0 % (ref 0.0–0.2)

## 2023-12-25 LAB — CBC WITH DIFFERENTIAL/PLATELET
Basophils Absolute: 0 K/uL (ref 0.0–0.1)
Basophils Relative: 0.9 % (ref 0.0–3.0)
Eosinophils Absolute: 0.4 K/uL (ref 0.0–0.7)
Eosinophils Relative: 7.7 % — ABNORMAL HIGH (ref 0.0–5.0)
HCT: 41.9 % (ref 39.0–52.0)
Hemoglobin: 14.7 g/dL (ref 13.0–17.0)
Lymphocytes Relative: 36.6 % (ref 12.0–46.0)
Lymphs Abs: 2 K/uL (ref 0.7–4.0)
MCHC: 35.2 g/dL (ref 30.0–36.0)
MCV: 97.7 fl (ref 78.0–100.0)
Monocytes Absolute: 0.4 K/uL (ref 0.1–1.0)
Monocytes Relative: 6.3 % (ref 3.0–12.0)
Neutro Abs: 2.7 K/uL (ref 1.4–7.7)
Neutrophils Relative %: 48.5 % (ref 43.0–77.0)
Platelets: 224 K/uL (ref 150.0–400.0)
RBC: 4.29 Mil/uL (ref 4.22–5.81)
RDW: 13.7 % (ref 11.5–15.5)
WBC: 5.6 K/uL (ref 4.0–10.5)

## 2023-12-25 LAB — COMPREHENSIVE METABOLIC PANEL WITH GFR
ALT: 22 U/L (ref 0–53)
AST: 20 U/L (ref 0–37)
Albumin: 3.9 g/dL (ref 3.5–5.2)
Alkaline Phosphatase: 65 U/L (ref 39–117)
BUN: 12 mg/dL (ref 6–23)
CO2: 27 meq/L (ref 19–32)
Calcium: 9.1 mg/dL (ref 8.4–10.5)
Chloride: 106 meq/L (ref 96–112)
Creatinine, Ser: 0.93 mg/dL (ref 0.40–1.50)
GFR: 90.4 mL/min (ref >60.00)
Glucose, Bld: 150 mg/dL — ABNORMAL HIGH (ref 70–99)
Potassium: 4.3 meq/L (ref 3.5–5.1)
Sodium: 140 meq/L (ref 135–145)
Total Bilirubin: 1.8 mg/dL — ABNORMAL HIGH (ref 0.2–1.2)
Total Protein: 6 g/dL (ref 6.0–8.3)

## 2023-12-25 LAB — CMP (CANCER CENTER ONLY)
ALT: 25 U/L (ref 0–44)
AST: 23 U/L (ref 15–41)
Albumin: 4 g/dL (ref 3.5–5.0)
Alkaline Phosphatase: 71 U/L (ref 38–126)
Anion gap: 5 (ref 5–15)
BUN: 14 mg/dL (ref 6–20)
CO2: 29 mmol/L (ref 22–32)
Calcium: 9.1 mg/dL (ref 8.9–10.3)
Chloride: 107 mmol/L (ref 98–111)
Creatinine: 0.91 mg/dL (ref 0.61–1.24)
GFR, Estimated: >60 mL/min (ref >60)
Glucose, Bld: 107 mg/dL — ABNORMAL HIGH (ref 70–99)
Potassium: 4.3 mmol/L (ref 3.5–5.1)
Sodium: 141 mmol/L (ref 135–145)
Total Bilirubin: 1.7 mg/dL — ABNORMAL HIGH (ref 0.0–1.2)
Total Protein: 6.5 g/dL (ref 6.5–8.1)

## 2023-12-25 LAB — FERRITIN
Ferritin: 106.3 ng/mL (ref 22.0–322.0)
Ferritin: 215 ng/mL (ref 24–336)

## 2023-12-25 LAB — LIPID PANEL
Cholesterol: 122 mg/dL (ref 0–200)
HDL: 47.8 mg/dL (ref >39.00)
LDL Cholesterol: 56 mg/dL (ref 0–99)
NonHDL: 74.44
Total CHOL/HDL Ratio: 3
Triglycerides: 94 mg/dL (ref 0.0–149.0)
VLDL: 18.8 mg/dL (ref 0.0–40.0)

## 2023-12-25 LAB — IRON AND IRON BINDING CAPACITY (CC-WL,HP ONLY)
Iron: 223 ug/dL — ABNORMAL HIGH (ref 45–182)
Saturation Ratios: 93 % — ABNORMAL HIGH (ref 17.9–39.5)
TIBC: 241 ug/dL — ABNORMAL LOW (ref 250–450)
UIBC: 18 ug/dL — ABNORMAL LOW (ref 117–376)

## 2023-12-25 LAB — IBC PANEL
Iron: 216 ug/dL — ABNORMAL HIGH (ref 42–165)
Saturation Ratios: 98.3 % — ABNORMAL HIGH (ref 20.0–50.0)
TIBC: 219.8 ug/dL — ABNORMAL LOW (ref 250.0–450.0)
Transferrin: 157 mg/dL — ABNORMAL LOW (ref 212.0–360.0)

## 2023-12-25 LAB — T4, FREE: Free T4: 0.83 ng/dL (ref 0.60–1.60)

## 2023-12-25 LAB — PSA: PSA: 0.2 ng/mL (ref 0.10–4.00)

## 2023-12-25 NOTE — Progress Notes (Unsigned)
 [facility] Telephone:([phone]) [phone]   Fax:([phone]) [phone]  PROGRESS NOTE  Patient Care Team: Rilla Baller, MD as PCP - General (Family Medicine) Alvia Norleen BIRCH, MD as Consulting Physician (Ophthalmology) Aneita Gwendlyn DASEN, MD (Inactive) as Consulting Physician (Gastroenterology) Eilleen Richerd GRADE, RN as [facility] Care Management  Hematological/Oncological History # Hereditary Hemochromatosis- Homozygous for C282Y  06/27/2013: HFE testing showed patient was homozygous for C282Y, ferritin >1500 06/19/13- 04/09/2018: underwent phlebotomies, Ferritin decreased to 41.7 on 05/09/2018 03/12/2021: Ferritin 17 06/21/2021: establish care with Dr Federico   Interval History:  Michael Vaughan 58 y.o. male with medical history significant for hereditary hemochromatosis who presents for a follow up visit. The patient's last visit was on 06/20/2022. In the interim since the last visit he has not required any phlebotomies.  On exam today Michael Vaughan reports he has been well overall in the interim since our last visit.  He reports he did however have a bout of legionnaires disease and was in the hospital.  He reports that his respiratory status became so severe he feared for his life.  He reports that he is made an excellent recovery since that time.  He reports that he is now taking probiotic therapy but no other new treatments at this time.  His last phlebotomy was back in December 2024.  If he were to start phlebotomies again he would like to have it done every 2 weeks.  He is not having any other symptoms at this time such as fevers, chills, sweats, nausea, Oni or diarrhea.  A full 10 point ROS is otherwise negative.  MEDICAL HISTORY:  Past Medical History:  Diagnosis Date   Allergy    Arthralgia    takes aleve regularly   Arthritis    Cataract    Heartburn    Hereditary hemochromatosis (HCC) 05/2013   iron overload with mild HM, homozygous for C282Y, monthly blood draws   History of  chicken pox    History of migraine 1990s   remote   Retinitis pigmentosa     SURGICAL HISTORY: Past Surgical History:  Procedure Laterality Date   CATARACT EXTRACTION BILATERAL W/ ANTERIOR VITRECTOMY Bilateral 2001   COLONOSCOPY  12/2016   WNL [place])   INGUINAL HERNIA REPAIR  1972   at age 52   IRIDECTOMY Right 01/14/2014   Procedure: IRIDECTOMY;  Surgeon: Norleen BIRCH Alvia, MD;  Location: [facility] OR;  Service: Ophthalmology;  Laterality: Right;   PARS PLANA VITRECTOMY  01/24/2012   Procedure: PARS PLANA VITRECTOMY WITH 25G REMOVAL/SUTURE INTRAOCULAR LENS;  Surgeon: Norleen BIRCH Alvia, MD;  Location: [facility] OR;  Service: Ophthalmology;  Laterality: Left;   PARS PLANA VITRECTOMY Right 01/14/2014   dislocated intraocular lens - PARS PLANA VITRECTOMY WITH 25G REMOVAL/SUTURE SECONDARY INTRAOCULAR LENS;  Norleen BIRCH Alvia, MD   PHOTOCOAGULATION WITH LASER Right 01/14/2014   Procedure: PHOTOCOAGULATION WITH LASER;  Surgeon: Norleen BIRCH Alvia, MD;  Location: [facility] OR;  Service: Ophthalmology;  Laterality: Right;   TOTAL HIP ARTHROPLASTY Right 04/27/2021   Procedure: TOTAL HIP ARTHROPLASTY ANTERIOR APPROACH;  Surgeon: Ernie Cough, MD;  Location: [facility] ORS;  Service: Orthopedics;  Laterality: Right;    SOCIAL HISTORY: Social History   Socioeconomic History   Marital status: Single    Spouse name: Not on file   Number of children: 2   Years of education: Not on file   Highest education level: 12th grade  Occupational History   Occupation: disabled  Tobacco Use   Smoking status: Former  Current packs/day: 0.00    Types: Cigarettes    Quit date: 03/12/2004    Years since quitting: 19.8   Smokeless tobacco: Current    Types: Snuff    Last attempt to quit: 10/12/2015   Tobacco comments:    quit in 2005, occasional smokeless tobacco - now using the [employer] tobacco free snuff  Vaping Use   Vaping status: Never Used  Substance and Sexual Activity   Alcohol use: Yes    Alcohol/week: 0.0 standard  drinks of alcohol    Comment: rarely   Drug use: No   Sexual activity: Yes  Other Topics Concern   Not on file  Social History Narrative   Caffeine: 4-6 cups coffee   Lives with wife, 1 son, other grown children, dog and cows   Occupation: on disability for vision loss, legally blind, raises cows   Edu: HS   Activity: raises cows, some hunting/fishing   Diet: good water , daily fruits/vegetables   Social Drivers of Corporate investment banker Strain: Low Risk  (12/23/2023)   Overall Financial Resource Strain (CARDIA)    Difficulty of Paying Living Expenses: Not hard at all  Food Insecurity: No Food Insecurity (12/23/2023)   Hunger Vital Sign    Worried About Running Out of Food in the Last Year: Never true    Ran Out of Food in the Last Year: Never true  Transportation Needs: No Transportation Needs (12/23/2023)   PRAPARE - Administrator, Civil Service (Medical): No    Lack of Transportation (Non-Medical): No  Physical Activity: Inactive (12/23/2023)   Exercise Vital Sign    Days of Exercise per Week: 0 days    Minutes of Exercise per Session: Not on file  Stress: No Stress Concern Present (12/23/2023)   [employer] of Occupational Health - Occupational Stress Questionnaire    Feeling of Stress: Not at all  Social Connections: Socially Integrated (12/23/2023)   Social Connection and Isolation Panel    Frequency of Communication with Friends and Family: More than three times a week    Frequency of Social Gatherings with Friends and Family: More than three times a week    Attends Religious Services: 1 to 4 times per year    Active Member of [employer] or Organizations: Yes    Attends Engineer, structural: More than 4 times per year    Marital Status: Married  Catering manager Violence: Not At Risk (12/20/2023)   Humiliation, Afraid, Rape, and Kick questionnaire    Fear of Current or Ex-Partner: No    Emotionally Abused: No    Physically Abused: No    Sexually  Abused: No    FAMILY HISTORY: Family History  Problem Relation Age of Onset   Colon cancer Paternal Aunt 38   Leukemia Father        CLL spread to brain   Hypertension Father        and mother and family   Breast cancer Paternal Aunt    Prostate cancer Brother 33   Hyperlipidemia Other        father's side   CAD Paternal Uncle        MI, CABG   Stroke Maternal Grandmother    Diabetes Other        mother's side   Deep vein thrombosis Brother        with PE   Stroke Maternal Grandfather    Hypertension Mother    Esophageal cancer Neg Hx  Liver cancer Neg Hx    Pancreatic cancer Neg Hx    Rectal cancer Neg Hx    Stomach cancer Neg Hx     ALLERGIES:  is allergic to augmentin [amoxicillin-pot clavulanate].  MEDICATIONS:  Current Outpatient Medications  Medication Sig Dispense Refill   amLODipine  (NORVASC ) 5 MG tablet Take 1 tablet (5 mg total) by mouth daily. 90 tablet 3   pilocarpine  (PILOCAR) 1 % ophthalmic solution Place 1 drop into the left eye 2 (two) times daily.      Probiotic Product (PROBIOTIC DAILY PO) Take by mouth.     No current facility-administered medications for this visit.    REVIEW OF SYSTEMS:   Constitutional: ( - ) fevers, ( - )  chills , ( - ) night sweats Eyes: ( - ) blurriness of vision, ( - ) double vision, ( - ) watery eyes Ears, nose, mouth, throat, and face: ( - ) mucositis, ( - ) sore throat Respiratory: ( - ) cough, ( - ) dyspnea, ( - ) wheezes Cardiovascular: ( - ) palpitation, ( - ) chest discomfort, ( - ) lower extremity swelling Gastrointestinal:  ( - ) nausea, ( - ) heartburn, ( - ) change in bowel habits Skin: ( - ) abnormal skin rashes Lymphatics: ( - ) new lymphadenopathy, ( - ) easy bruising Neurological: ( - ) numbness, ( - ) tingling, ( - ) new weaknesses Behavioral/Psych: ( - ) mood change, ( - ) new changes  All other systems were reviewed with the patient and are negative.  PHYSICAL EXAMINATION:  There were no vitals  filed for this visit.   There were no vitals filed for this visit.    GENERAL: Well-appearing middle-age Caucasian male, alert, no distress and comfortable SKIN: skin color, texture, turgor are normal, no rashes or significant lesions EYES: conjunctiva are pink and non-injected, sclera clear LUNGS: clear to auscultation and percussion with normal breathing effort HEART: regular rate & rhythm and no murmurs and no lower extremity edema Musculoskeletal: no cyanosis of digits and no clubbing  PSYCH: alert & oriented x 3, fluent speech NEURO: no focal motor/sensory deficits  LABORATORY DATA:  I have reviewed the data as listed    Latest Ref Rng & Units 12/08/2023   12:22 PM 12/03/2023    8:57 AM 12/02/2023    4:33 AM  CBC  WBC 4.0 - 10.5 K/uL 11.5  5.9  9.0   Hemoglobin 13.0 - 17.0 g/dL 85.7  87.3  87.1   Hematocrit 39.0 - 52.0 % 40.7  36.2  35.9   Platelets 150.0 - 400.0 K/uL 544.0  200  173        Latest Ref Rng & Units 12/08/2023   12:22 PM 12/03/2023    8:57 AM 12/02/2023    4:33 AM  CMP  Glucose 70 - 99 mg/dL 899  885  886   BUN 6 - 23 mg/dL 8  15  20    Creatinine 0.40 - 1.50 mg/dL 9.20  9.28  9.08   Sodium 135 - 145 mEq/L 140  133  129   Potassium 3.5 - 5.1 mEq/L 4.1  3.5  3.2   Chloride 96 - 112 mEq/L 103  102  96   CO2 19 - 32 mEq/L 32  23  22   Calcium 8.4 - 10.5 mg/dL 8.9  8.2  8.1   Total Protein 6.0 - 8.3 g/dL 6.1   5.5   Total Bilirubin 0.2 - 1.2 mg/dL 1.0  2.1   Alkaline Phos 39 - 117 U/L 69   48   AST 0 - 37 U/L 47   35   ALT 0 - 53 U/L 61   29    RADIOGRAPHIC STUDIES: DG Chest 2 View Result Date: 12/01/2023 CLINICAL DATA:  Possible sepsis EXAM: CHEST - 2 VIEW COMPARISON:  03/03/2021 FINDINGS: Heterogeneous ground-glass opacity within the left lung, suspicious for pneumonia. No pleural effusion. Normal cardiac size. No pneumothorax IMPRESSION: Heterogeneous ground-glass opacity within the left lung, suspicious for pneumonia. Electronically Signed   By: Luke Bun [name]   On: 12/01/2023 16:05    ASSESSMENT & PLAN Michael Vaughan 58 y.o. male with medical history significant for hereditary hemochromatosis who presents for a follow up visit.   After review of the labs, review of the records, and discussion with the patient the patients findings are most consistent with hereditary hemochromatosis from a homozygous C282Y mutation.   Elevated serum ferritin levels have numerous possible etiologies. These include hereditary hemochromatosis (heterozygous or homozygous), inflammation, liver disease, or iron overload from an exogenous source. Hereditary hemochromatosis is a hereditary condition caused by mutations in the HFE gene, which regulates iron absorption. The most common genes mutated in this condition are the C282Y and H63D genes. Homozygous mutations represent a disease state which requires phlebotomy to decrease ferritin levels to a goal of <50  (Blood (2010) 116 (3): 317-325). The goal is to decrease ferritin so there is no deposition in critical organs (liver, heart, pancreas and thyroid ). Heterozygous mutations (or compound heterozygotes) rarely require phlebotomy, but do have elevated serum iron/ferritin levels.  Ferritin is an acute phase reactant and can be elevated with systemic inflammation. Direct damage to liver tissue can also cause spillage of ferritin into the blood, resulting in elevated ferritin.  Additionally, serum iron levels can be quite transient and an elevation or serum iron may not represent a true overload of total body iron (best lab for this is ferritin).     #Homozygous Hereditary Hemochromatosis  --labs to include CBC, CMP --patient has prior testing for TSH/T4 and Hgb A1c --will repeat iron panel and ferritin today --prior positive testing for HFE gene mutation. Found to have homozygous mutation for C282Y. -- no inidication for phlebotomy at this time. We will begin phlebotomies every other week if ferritin increases above  150.  --Labs today show white blood cell count 6.4, hemoglobin 15.2, MCV 94.6, platelets 218.  Ferritin 215.  -- As long as ferritin is <150 there is no need for phlebotomy.  Unfortunately he is over goal will need to start phlebotomies. --RTC in 6 months with interval 3 month labs.  Or sooner phlebotomies are required.  Patient prefers every 2 week phlebotomies.    No orders of the defined types were placed in this encounter.   All questions were answered. The patient knows to call the clinic with any problems, questions or concerns.  A total of more than 30 minutes were spent on this encounter with face-to-face time and non-face-to-face time, including preparing to see the patient, ordering tests and/or medications, counseling the patient and coordination of care as outlined above.   Norleen IVAR Kidney, MD Department of Hematology/Oncology [facility] Phone: [phone] Pager: [phone] Email: [email or web address]  12/25/2023 7:25 AM

## 2023-12-26 ENCOUNTER — Ambulatory Visit (INDEPENDENT_AMBULATORY_CARE_PROVIDER_SITE_OTHER): Payer: Medicare PPO

## 2023-12-26 ENCOUNTER — Encounter: Payer: Self-pay | Admitting: Hematology and Oncology

## 2023-12-26 VITALS — Ht 70.0 in | Wt 193.0 lb

## 2023-12-26 DIAGNOSIS — Z0000 Encounter for general adult medical examination without abnormal findings: Secondary | ICD-10-CM | POA: Diagnosis not present

## 2023-12-26 LAB — MICROALBUMIN / CREATININE URINE RATIO
Creatinine,U: 164.9 mg/dL
Microalb Creat Ratio: UNDETERMINED mg/g (ref 0.0–30.0)
Microalb, Ur: <0.7 mg/dL

## 2023-12-26 NOTE — Progress Notes (Signed)
 Subjective:   Michael Vaughan is a 58 y.o. who presents for a Medicare Wellness preventive visit.  As a reminder, Annual Wellness Visits don't include a physical exam, and some assessments may be limited, especially if this visit is performed virtually. We may recommend an in-person follow-up visit with your provider if needed.  Visit Complete: Virtual I connected with  Ubaldo Husband on 12/26/23 by a audio enabled telemedicine application and verified that I am speaking with the correct person using two identifiers.  Patient Location: Home  Provider Location: Office/Clinic  I discussed the limitations of evaluation and management by telemedicine. The patient expressed understanding and agreed to proceed.  Vital Signs: Because this visit was a virtual/telehealth visit, some criteria may be missing or patient reported. Any vitals not documented were not able to be obtained and vitals that have been documented are patient reported.  VideoDeclined- This patient declined Librarian, academic. Therefore the visit was completed with audio only.  Persons Participating in Visit: Patient.  AWV Questionnaire: Yes: Patient Medicare AWV questionnaire was completed by the patient on 12/23/23; I have confirmed that all information answered by patient is correct and no changes since this date.  Cardiac Risk Factors include: advanced age (>61men, >71 women);dyslipidemia;hypertension;male gender;sedentary lifestyle     Objective:    Today's Vitals   12/26/23 1143  Weight: 193 lb (87.5 kg)  Height: 5' 10 (1.778 m)   Body mass index is 27.69 kg/m.     12/26/2023   11:49 AM 12/01/2023    8:47 PM 12/22/2022   10:36 AM 04/27/2021    6:58 AM 04/16/2021    9:57 AM 01/06/2017    7:48 AM 12/22/2016    2:45 PM  Advanced Directives  Does Patient Have a Medical Advance Directive? Yes No No No No No  No   Type of Estate agent of [place];Living will         Copy of Healthcare Power of Attorney in Chart? No - copy requested        Would patient like information on creating a medical advance directive?  No - Patient declined No - Patient declined No - Patient declined        Data saved with a previous flowsheet row definition    Current Medications (verified) Outpatient Encounter Medications as of 12/26/2023  Medication Sig   amLODipine  (NORVASC ) 5 MG tablet Take 1 tablet (5 mg total) by mouth daily.   pilocarpine  (PILOCAR) 1 % ophthalmic solution Place 1 drop into the left eye 2 (two) times daily.    Probiotic Product (PROBIOTIC DAILY PO) Take by mouth.   No facility-administered encounter medications on file as of 12/26/2023.    Allergies (verified) Augmentin [amoxicillin-pot clavulanate]   History: Past Medical History:  Diagnosis Date   Allergy    Arthralgia    takes aleve regularly   Arthritis    Cataract    Heartburn    Hereditary hemochromatosis (HCC) 05/2013   iron overload with mild HM, homozygous for C282Y, monthly blood draws   History of chicken pox    History of migraine 1990s   remote   Retinitis pigmentosa    Past Surgical History:  Procedure Laterality Date   CATARACT EXTRACTION BILATERAL W/ ANTERIOR VITRECTOMY Bilateral 2001   COLONOSCOPY  12/2016   WNL [place])   EYE SURGERY  02/1999   INGUINAL HERNIA REPAIR  1972   at age 3   IRIDECTOMY Right 01/14/2014   Procedure: IRIDECTOMY;  Surgeon: Norleen JONETTA Ku, MD;  Location: [facility] OR;  Service: Ophthalmology;  Laterality: Right;   JOINT REPLACEMENT  04/27/21   PARS PLANA VITRECTOMY  01/24/2012   Procedure: PARS PLANA VITRECTOMY WITH 25G REMOVAL/SUTURE INTRAOCULAR LENS;  Surgeon: Norleen JONETTA Ku, MD;  Location: [facility] OR;  Service: Ophthalmology;  Laterality: Left;   PARS PLANA VITRECTOMY Right 01/14/2014   dislocated intraocular lens - PARS PLANA VITRECTOMY WITH 25G REMOVAL/SUTURE SECONDARY INTRAOCULAR LENS;  Norleen JONETTA Ku, MD   PHOTOCOAGULATION WITH LASER Right  01/14/2014   Procedure: PHOTOCOAGULATION WITH LASER;  Surgeon: Norleen JONETTA Ku, MD;  Location: [facility] OR;  Service: Ophthalmology;  Laterality: Right;   TOTAL HIP ARTHROPLASTY Right 04/27/2021   Procedure: TOTAL HIP ARTHROPLASTY ANTERIOR APPROACH;  Surgeon: Ernie Cough, MD;  Location: [facility] ORS;  Service: Orthopedics;  Laterality: Right;   Family History  Problem Relation Age of Onset   Colon cancer Paternal Aunt 66   Leukemia Father        CLL spread to brain   Hypertension Father        and mother and family   Cancer Father    Breast cancer Paternal Aunt    Prostate cancer Brother 96   Hyperlipidemia Other        father's side   CAD Paternal Uncle        MI, CABG   Stroke Maternal Grandmother    Diabetes Other        mother's side   Deep vein thrombosis Brother        with PE   Stroke Maternal Grandfather    Vision loss Maternal Grandfather    Hypertension Mother    Arthritis Mother    Vision loss Mother    Diabetes Maternal Aunt    Esophageal cancer Neg Hx    Liver cancer Neg Hx    Pancreatic cancer Neg Hx    Rectal cancer Neg Hx    Stomach cancer Neg Hx    Social History   Socioeconomic History   Marital status: Single    Spouse name: Not on file   Number of children: 2   Years of education: Not on file   Highest education level: 12th grade  Occupational History   Occupation: disabled  Tobacco Use   Smoking status: Former    Current packs/day: 0.00    Types: Cigarettes    Quit date: 03/12/2004    Years since quitting: 19.8   Smokeless tobacco: Current    Types: Snuff    Last attempt to quit: 10/12/2015   Tobacco comments:    quit in 2005, occasional smokeless tobacco - now using the [employer] tobacco free snuff  Vaping Use   Vaping status: Never Used  Substance and Sexual Activity   Alcohol use: Yes    Alcohol/week: 0.0 standard drinks of alcohol    Comment: rarely   Drug use: No   Sexual activity: Yes  Other Topics Concern   Not on file  Social  History Narrative   Caffeine: 4-6 cups coffee   Lives with wife, 1 son, other grown children, dog and cows   Occupation: on disability for vision loss, legally blind, raises cows   Edu: HS   Activity: raises cows, some hunting/fishing   Diet: good water , daily fruits/vegetables   Social Drivers of Health   Financial Resource Strain: Low Risk  (12/23/2023)   Overall Financial Resource Strain (CARDIA)    Difficulty of Paying Living Expenses: Not hard at all  Food Insecurity: No Food Insecurity (12/23/2023)   Hunger Vital Sign    Worried About Running Out of Food in the Last Year: Never true    Ran Out of Food in the Last Year: Never true  Transportation Needs: No Transportation Needs (12/23/2023)   PRAPARE - Administrator, Civil Service (Medical): No    Lack of Transportation (Non-Medical): No  Physical Activity: Inactive (12/23/2023)   Exercise Vital Sign    Days of Exercise per Week: 0 days    Minutes of Exercise per Session: Not on file  Stress: No Stress Concern Present (12/23/2023)   [employer] of Occupational Health - Occupational Stress Questionnaire    Feeling of Stress: Not at all  Social Connections: Socially Integrated (12/23/2023)   Social Connection and Isolation Panel    Frequency of Communication with Friends and Family: More than three times a week    Frequency of Social Gatherings with Friends and Family: More than three times a week    Attends Religious Services: 1 to 4 times per year    Active Member of [employer] or Organizations: Yes    Attends Engineer, structural: More than 4 times per year    Marital Status: Married    Tobacco Counseling Ready to quit: Not Answered Counseling given: Not Answered Tobacco comments: quit in 2005, occasional smokeless tobacco - now using the [employer] tobacco free snuff    Clinical Intake:  Pre-visit preparation completed: Yes  Pain : No/denies pain     BMI - recorded: 27.69 Nutritional  Status: BMI 25 -29 Overweight Nutritional Risks: None Diabetes: No  Lab Results  Component Value Date   HGBA1C 4.9 03/03/2021     How often do you need to have someone help you when you read instructions, pamphlets, or other written materials from your doctor or pharmacy?: 1 - Never  Interpreter Needed?: No  Comments: lives with wife Information entered by :: B.Tatsuya Okray,LPN   Activities of Daily Living     12/23/2023    3:05 PM 12/01/2023    8:46 PM  In your present state of health, do you have any difficulty performing the following activities:  Hearing? 0 0  Vision? 1 1  Difficulty concentrating or making decisions? 0 0  Walking or climbing stairs? 0   Dressing or bathing? 0   Doing errands, shopping? 1   Preparing Food and eating ? N   Using the Toilet? N   In the past six months, have you accidently leaked urine? Y   Do you have problems with loss of bowel control? N   Managing your Medications? N   Managing your Finances? N   Housekeeping or managing your Housekeeping? N     Patient Care Team: Rilla Baller, MD as PCP - General (Family Medicine) Alvia Norleen BIRCH, MD as Consulting Physician (Ophthalmology) Aneita Gwendlyn DASEN, MD (Inactive) as Consulting Physician (Gastroenterology) Eilleen Richerd GRADE, RN as [facility] Care Management  I have updated your Care Teams any recent Medical Services you may have received from other providers in the past year.     Assessment:   This is a routine wellness examination for Casey.  Hearing/Vision screen Hearing Screening - Comments:: Pt says his hearing is good Vision Screening - Comments:: Pt says his vision is ok:has blurred, floaters, decreased peripheral vision, Dr Norleen Alvia   Goals Addressed             This Visit's Progress    COMPLETED: Increase  physical activity       Starting 10/27/2015, I will continue to walk at least 30 min daily.      Patient Stated   Not on track    12/26/23-Patient states he wants  to fish more.      Patient Stated       No goals right now     VBCI Transitions of Care (TOC) Care Plan   On track    Problems:  Recent Hospitalization for treatment of Pulmonary Disease and Severe Sepsis due to pneumonia Knowledge Deficit Related to Legionella pneumonia (patient states MD - Dr. 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  Goal:  Over the next 30 days, the patient will not experience hospital readmission  Interventions:  Transitions of Care: Doctor Visits  - discussed the importance of doctor visits Reviewed Signs and symptoms of infection 12/20/23 improving no coughing, completed antibiotic  Patient Self Care Activities:  Attend all scheduled provider appointments Call pharmacy for medication refills 3-7 days in advance of running out of medications Call provider office for new concerns or questions  Notify RN Care Manager of [facility] call rescheduling needs Participate in Transition of Care Program/Attend TOC scheduled calls Taking a probiotic - 12/20/23 Probiotics generic OTC [employer] brand working, no GI issues Bought disposable and N95 Mask from [employer] (while working in the yard and with chickens)  Plan:  The care management team will reach out to the patient again over the next 7-10 days. Caregiver to wife, take breaks, working on preserving energy and yet trying to do more       Depression Screen     12/26/2023   11:47 AM 12/20/2023   11:07 AM 12/08/2023   11:18 AM 04/03/2023   11:34 AM 12/22/2022   10:32 AM 12/22/2021    8:59 AM 12/21/2020    9:08 AM  PHQ 2/9 Scores  PHQ - 2 Score 0 0 0 0 0 0 0  PHQ- 9 Score    2       Fall Risk     12/23/2023    3:05 PM 12/20/2023   11:07 AM 12/13/2023   11:04 AM 12/08/2023   11:17 AM 04/03/2023   11:34 AM  Fall Risk   Falls in the past year? 0 0 0 0 0  Number falls in past yr: 0 0  0    Injury with Fall? 0 0 0    Risk for fall due to : No Fall Risks      Follow up Education provided;Falls prevention discussed        MEDICARE RISK AT HOME:  Medicare Risk at Home Any stairs in or around the home?: (Patient-Rptd) Yes If so, are there any without handrails?: (Patient-Rptd) No Home free of loose throw rugs in walkways, pet beds, electrical cords, etc?: (Patient-Rptd) Yes Adequate lighting in your home to reduce risk of falls?: (Patient-Rptd) Yes Life alert?: (Patient-Rptd) No Use of a cane, walker or w/c?: (Patient-Rptd) No Grab bars in the bathroom?: (Patient-Rptd) Yes Shower chair or bench in shower?: (Patient-Rptd) No Elevated toilet seat or a handicapped toilet?: (Patient-Rptd) No  TIMED UP AND GO:  Was the test performed?  No  Cognitive Function: 6CIT completed    10/27/2015    8:32 AM  MMSE - Mini Mental State Exam  Orientation to time 5   Orientation to Place  5   Registration 3   Attention/ Calculation 0   Recall 3   Language- name 2 objects 0   Language- repeat 1  Language- follow 3 step command 3   Language- read & follow direction 0   Write a sentence 0   Copy design 0   Total score 20      Data saved with a previous flowsheet row definition        12/26/2023   11:51 AM 12/22/2022   10:37 AM  6CIT Screen  What Year? 0 points 0 points  What month? 0 points 0 points  What time? 0 points 0 points  Count back from 20 0 points 0 points  Months in reverse 0 points 0 points  Repeat phrase 0 points 0 points  Total Score 0 points 0 points    Immunizations Immunization History  Administered Date(s) Administered   Influenza Inj Mdck Quad Pf 07/08/2018   Influenza Split 03/13/2012, 02/11/2013   Influenza, Seasonal, Injecte, Preservative Fre 04/03/2023   Influenza,inj,Quad PF,6+ Mos 04/06/2017, 03/03/2021, 03/24/2022   Influenza-Unspecified 03/10/2014   PFIZER(Purple Top)SARS-COV-2 Vaccination 08/21/2019, 09/11/2019, 04/05/2021   Tdap  05/24/2012   Zoster Recombinant(Shingrix ) 12/22/2021, 04/27/2022    Screening Tests Health Maintenance  Topic Date Due   Hepatitis B Vaccines (1 of 3 - 19+ 3-dose series) Never done   DTaP/Tdap/Td (2 - Td or Tdap) 05/24/2022   COVID-19 Vaccine (4 - 2024-25 season) 02/12/2023   INFLUENZA VACCINE  01/12/2024   Medicare Annual Wellness (AWV)  12/25/2024   Colonoscopy  01/07/2027   Hepatitis C Screening  Completed   HIV Screening  Completed   Zoster Vaccines- Shingrix   Completed   HPV VACCINES  Aged Out   Meningococcal B Vaccine  Aged Out    Health Maintenance  Health Maintenance Due  Topic Date Due   Hepatitis B Vaccines (1 of 3 - 19+ 3-dose series) Never done   DTaP/Tdap/Td (2 - Td or Tdap) 05/24/2022   COVID-19 Vaccine (4 - 2024-25 season) 02/12/2023   Health Maintenance Items Addressed: None needed at this time  Additional Screening:  Vision Screening: Recommended annual ophthalmology exams for early detection of glaucoma and other disorders of the eye. Would you like a referral to an eye doctor? No    Dental Screening: Recommended annual dental exams for proper oral hygiene  Community Resource Referral / Chronic Care Management: CRR required this visit?  No   CCM required this visit?  No   Plan:    I have personally reviewed and noted the following in the patient's chart:   Medical and social history Use of alcohol, tobacco or illicit drugs  Current medications and supplements including opioid prescriptions. Patient is not currently taking opioid prescriptions. Functional ability and status Nutritional status Physical activity Advanced directives List of other physicians Hospitalizations, surgeries, and ER visits in previous 12 months Vitals Screenings to include cognitive, depression, and falls Referrals and appointments  In addition, I have reviewed and discussed with patient certain preventive protocols, quality metrics, and best practice  recommendations. A written personalized care plan for preventive services as well as general preventive health recommendations were provided to patient.   Erminio LITTIE Saris, LPN   2/84/7974   After Visit Summary: (MyChart) Due to this being a telephonic visit, the after visit summary with patients personalized plan was offered to patient via MyChart   Notes: Nothing significant to report at this time.

## 2023-12-26 NOTE — Patient Instructions (Signed)
 [name] , Thank you for taking time out of your busy schedule to complete your Annual Wellness Visit with me. I enjoyed our conversation and look forward to speaking with you again next year. I, as well as your care team,  appreciate your ongoing commitment to your health goals. Please review the following plan we discussed and let me know if I can assist you in the future. Your Game plan/ To Do List     Follow up Visits: Next Medicare AWV with our clinical staff: 12/26/24 @ 11:30am televisit   Have you seen your provider in the last 6 months (3 months if uncontrolled diabetes)? Yes Next Office Visit with your provider: 01/01/24  Clinician Recommendations:  Aim for 30 minutes of exercise or brisk walking, 6-8 glasses of water , and 5 servings of fruits and vegetables each day. no      This is a list of the screening recommended for you and due dates:  Health Maintenance  Topic Date Due   Hepatitis B Vaccine (1 of 3 - 19+ 3-dose series) Never done   DTaP/Tdap/Td vaccine (2 - Td or Tdap) 05/24/2022   COVID-19 Vaccine (4 - 2024-25 season) 02/12/2023   Flu Shot  01/12/2024   Medicare Annual Wellness Visit  12/25/2024   Colon Cancer Screening  01/07/2027   Hepatitis C Screening  Completed   HIV Screening  Completed   Zoster (Shingles) Vaccine  Completed   HPV Vaccine  Aged Out   Meningitis B Vaccine  Aged Out    Advanced directives: (Copy Requested) Please bring a copy of your health care power of attorney and living will to the office to be added to your chart at your convenience. You can mail to [facility] [address] [place], [place] [address] or email to [email or web address] Advance Care Planning is important because it:  [x]  Makes sure you receive the medical care that is consistent with your values, goals, and preferences  [x]  It provides guidance to your family and loved ones and reduces their decisional burden about whether or not they are making the  right decisions based on your wishes.  Follow the link provided in your after visit summary or read over the paperwork we have mailed to you to help you started getting your Advance Directives in place. If you need assistance in completing these, please reach out to us  so that we can help you!

## 2023-12-27 ENCOUNTER — Telehealth: Payer: Self-pay

## 2023-12-27 ENCOUNTER — Other Ambulatory Visit: Payer: Self-pay

## 2023-12-27 NOTE — Transitions of Care (Post Inpatient/ED Visit) (Signed)
  Transition of Care week 4  Visit Note  12/27/2023  Name: Michael Vaughan MRN: [id number]          DOB: Nov 26, 1965  Situation: Patient enrolled in [facility] 30-day program. Visit completed with patient by telephone.   Background:   Initial Transition Care Management Follow-up Telephone Call    Past Medical History:  Diagnosis Date   Allergy    Arthralgia    takes aleve regularly   Arthritis    Cataract    Heartburn    Hereditary hemochromatosis (HCC) 05/2013   iron overload with mild HM, homozygous for C282Y, monthly blood draws   History of chicken pox    History of migraine 1990s   remote   Retinitis pigmentosa     Assessment: Patient Reported Symptoms: Cognitive Cognitive Status: Able to follow simple commands, Alert and oriented to person, place, and time (had a moment where thought he saw and heard something at the back door)   Health Maintenance Behaviors: Exercise, Social activities Health Facilitated by: Rest  Neurological Neurological Review of Symptoms: No symptoms reported Neurological Self-Management Outcome: 4 (good)  HEENT HEENT Symptoms Reported: No symptoms reported HEENT Self-Management Outcome: 4 (good)    Cardiovascular Cardiovascular Symptoms Reported: Fatigue (good days and bad days, bu mostly improving) Does patient have uncontrolled Hypertension?: Yes Is patient checking Blood Pressure at home?: Yes Patient's Recent BP reading at home:  (last BM 136/83 at MD office) Cardiovascular Management Strategies: Activity, Medication therapy Weight: 193 lb (87.5 kg) Cardiovascular Self-Management Outcome: 4 (good)  Respiratory Respiratory Symptoms Reported: No symptoms reported Respiratory Management Strategies: Activity, Adequate rest, Medication therapy Respiratory Self-Management Outcome: 4 (good)  Endocrine Endocrine Symptoms Reported: No symptoms reported Endocrine Self-Management Outcome: 4 (good)  Gastrointestinal Gastrointestinal Symptoms Reported: No  symptoms reported Gastrointestinal Self-Management Outcome: 4 (good)    Genitourinary Genitourinary Symptoms Reported: No symptoms reported Genitourinary Self-Management Outcome: 4 (good)  Integumentary Integumentary Symptoms Reported: No symptoms reported Skin Self-Management Outcome: 4 (good)  Musculoskeletal Musculoskelatal Symptoms Reviewed: Joint pain Additional Musculoskeletal Details: knee pain is on and off, Musculoskeletal Self-Management Outcome: 4 (good) Falls in the past year?: No    Psychosocial Psychosocial Symptoms Reported: No symptoms reported Behavioral Management Strategies: Adequate rest, Coping strategies Behavioral Health Self-Management Outcome: 4 (good) Behavioral Health Comment: Want to get back to more activity   Do you feel physically threatened by others?: No   Vitals:   12/27/23 1307  BP: 136/83    Medications Reviewed Today     Reviewed by Eilleen Richerd GRADE, RN (Registered Nurse) on 12/27/23 at 1304  Med List Status: <None>   Medication Order Taking? Sig Documenting Provider Last Dose Status Informant  amLODipine  (NORVASC ) 5 MG tablet [id number] Yes Take 1 tablet (5 mg total) by mouth daily. Rilla Baller, MD  Active Spouse/Significant Other  pilocarpine  [facility]) 1 % ophthalmic solution [id number] Yes Place 1 drop into the left eye 2 (two) times daily.  [provider]  Active Spouse/Significant Other  Probiotic Product (PROBIOTIC DAILY PO) [id number] Yes Take by mouth. [provider]  Active             Recommendation:   Continue Current Plan of Care   Richerd Eilleen, RN, BSN, CCM [facility] RN Care Manager Direct Dial: [phone]

## 2023-12-27 NOTE — Patient Instructions (Signed)
 Visit Information  Thank you for taking time to visit with me today. Please don't hesitate to contact me if I can be of assistance to you before our next scheduled telephone appointment.  Our next appointment is by telephone on 01/03/24 at 1345  Following is a copy of your care plan:   Goals Addressed             This Visit's Progress    VBCI Transitions of Care (TOC) Care Plan   On track    Problems: (Reviewed with patient 12/27/23) Recent Hospitalization for treatment of Pulmonary Disease and Severe Sepsis due to pneumonia Knowledge Deficit Related to Legionella pneumonia (patient states MD - Dr. 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  Goal:  Over the next 30 days, the patient will not experience hospital readmission  Interventions:  Transitions of Care: Doctor Visits  - discussed the importance of doctor visits Reviewed Signs and symptoms of infection 12/20/23 improving no coughing, completed antibiotic  Patient Self Care Activities:  Attend all scheduled provider appointments Call pharmacy for medication refills 3-7 days in advance of running out of medications Call provider office for new concerns or questions  Notify RN Care Manager of [facility] call rescheduling needs Participate in Transition of Care Program/Attend TOC scheduled calls Taking a probiotic - 12/20/23 Probiotics generic OTC [employer] brand working, no GI issues Bought disposable and N95 Mask from [employer] (while working in the yard and with chickens)  Plan:  The care management team will reach out to the patient again over the next 7-10 days. Caregiver to wife, take breaks, working on preserving energy and yet trying to do more        Patient verbalizes understanding of instructions and care plan provided today and agrees to view in MyChart. Active MyChart status and  patient understanding of how to access instructions and care plan via MyChart confirmed with patient.     The patient has been provided with contact information for the care management team and has been advised to call with any health related questions or concerns.   Please call the care guide team at [phone] if you need to cancel or reschedule your appointment.   Please call the [place]  National Suicide Prevention Lifeline: 940-790-5166 or TTY: 508-854-4493 TTY [phone]) to talk to a trained counselor if you are experiencing a Mental Health or Behavioral Health Crisis or need someone to talk to.  Richerd Fish, RN, BSN, CCM [facility], [facility] Health RN Care Manager Direct Dial: [phone]

## 2023-12-28 ENCOUNTER — Ambulatory Visit: Payer: Self-pay | Admitting: *Deleted

## 2023-12-28 NOTE — Telephone Encounter (Signed)
-----   Message from Norleen ONEIDA Kidney IV sent at 12/26/2023  7:12 PM EDT ----- Please let Michael Vaughan know that his ferritin is 215, he is overall a threshold of 150.  At this time would recommend starting phlebotomies.  Per his request we will begin these every 2 weeks.  We  will start next week if he is able. ----- Message ----- From: Interface, Lab In [place] Sent: 12/25/2023   2:01 PM EDT To: Norleen ONEIDA Kidney [place], MD

## 2023-12-28 NOTE — Telephone Encounter (Signed)
 [name] regarding his recent lab results. Spoke with him. Advised  that his ferritin is 215, he is overall a threshold of 150.  At this time would recommend starting phlebotomies.  Per his request we will begin these every 2 weeks.  We will start next week if he is able. Pt voiced understanding and he is able to start these phlebotomies next week.  Dr. Federico made aware.

## 2023-12-29 ENCOUNTER — Telehealth: Payer: Self-pay | Admitting: Hematology and Oncology

## 2023-12-29 ENCOUNTER — Other Ambulatory Visit: Payer: Self-pay | Admitting: Hematology and Oncology

## 2023-12-30 ENCOUNTER — Ambulatory Visit: Payer: Self-pay | Admitting: Family Medicine

## 2024-01-01 ENCOUNTER — Ambulatory Visit (INDEPENDENT_AMBULATORY_CARE_PROVIDER_SITE_OTHER)
Admission: RE | Admit: 2024-01-01 | Discharge: 2024-01-01 | Disposition: A | Discharge status: Home / Self Care | Source: Ambulatory Visit | Attending: Family Medicine | Admitting: Family Medicine

## 2024-01-01 ENCOUNTER — Ambulatory Visit (INDEPENDENT_AMBULATORY_CARE_PROVIDER_SITE_OTHER): Payer: Medicare PPO | Admitting: Family Medicine

## 2024-01-01 ENCOUNTER — Encounter: Payer: Self-pay | Admitting: Family Medicine

## 2024-01-01 VITALS — BP 138/68 | HR 90 | Temp 98.9°F | Ht 69.0 in | Wt 196.4 lb

## 2024-01-01 DIAGNOSIS — E785 Hyperlipidemia, unspecified: Secondary | ICD-10-CM | POA: Diagnosis not present

## 2024-01-01 DIAGNOSIS — A481 Legionnaires' disease: Secondary | ICD-10-CM

## 2024-01-01 DIAGNOSIS — I10 Essential (primary) hypertension: Secondary | ICD-10-CM | POA: Diagnosis not present

## 2024-01-01 DIAGNOSIS — Z0000 Encounter for general adult medical examination without abnormal findings: Secondary | ICD-10-CM

## 2024-01-01 DIAGNOSIS — E038 Other specified hypothyroidism: Secondary | ICD-10-CM | POA: Diagnosis not present

## 2024-01-01 DIAGNOSIS — Z7189 Other specified counseling: Secondary | ICD-10-CM | POA: Diagnosis not present

## 2024-01-01 DIAGNOSIS — J189 Pneumonia, unspecified organism: Secondary | ICD-10-CM | POA: Diagnosis not present

## 2024-01-01 DIAGNOSIS — L570 Actinic keratosis: Secondary | ICD-10-CM | POA: Diagnosis not present

## 2024-01-01 DIAGNOSIS — H3552 Pigmentary retinal dystrophy: Secondary | ICD-10-CM | POA: Diagnosis not present

## 2024-01-01 MED ORDER — AMLODIPINE BESYLATE 5 MG PO TABS: 5.0000 mg | ORAL_TABLET | Freq: Every day | ORAL | 4 refills | Status: DC

## 2024-01-01 NOTE — Patient Instructions (Addendum)
 Chest xray today  May take tylenol  for left facial pain. Let us  know if ongoing or progressive symptoms ie spreading redness to left cheek, fever, congestion develops.  Good to see you today Return as needed or in 1 year for next physical/wellness visit

## 2024-01-01 NOTE — Assessment & Plan Note (Signed)
 Advanced directive - brings in today, reviewed and sent for scanning 12/2023. Would want 2 sons Renny Remer and Fonda to be HCPOA. Does not want prolonged life support if terminal condition. Wants living will followed.

## 2024-01-01 NOTE — Assessment & Plan Note (Signed)
 Preventative protocols reviewed and updated unless pt declined. Discussed healthy diet and lifestyle.

## 2024-01-01 NOTE — Assessment & Plan Note (Signed)
 Chronic, stable. Continue current regimen.

## 2024-01-01 NOTE — Assessment & Plan Note (Signed)
 Continued fatigue but continues improving. Update CXR today

## 2024-01-01 NOTE — Assessment & Plan Note (Addendum)
 Appreciate hematology - planning to restart blood draws

## 2024-01-01 NOTE — Progress Notes (Signed)
 Ph: [phone] Fax: [phone]   Patient ID: Michael Vaughan, male    DOB: September 19, 1965, 58 y.o.   MRN: [id number]  This visit was conducted in person.  BP 138/68   Pulse 90   Temp 98.9 F (37.2 C) (Oral)   Ht 5' 9 (1.753 m)   Wt 196 lb 6.4 oz (89.1 kg)   SpO2 98%   BMI 29.00 kg/m    CC: CPE Subjective:   HPI: Michael Vaughan is a 58 y.o. male presenting on 01/01/2024 for Annual Exam   Saw health advisor last week for medicare wellness visit. Note reviewed.   No results found.  Flowsheet Row Office Visit from 01/01/2024 in [facility] [place]  PHQ-2 Total Score 0       01/01/2024   10:23 AM 12/27/2023    1:07 PM 12/23/2023    3:05 PM 12/20/2023   11:07 AM 12/13/2023   11:04 AM  Fall Risk   Falls in the past year? 0 0 0 0 0  Number falls in past yr:   0 0 0  Injury with Fall?   0 0 0  Risk for fall due to :   No Fall Risks    Follow up   Education provided;Falls prevention discussed     Recent hospitalization for Legionnaire's disease s/p azithromycin  treatment. Notes prolonged fatigue.   H/o retinitis pigmentosa followed by eye doctor yearly [place]) - he no longer drives (since 2000).    Hemochromatosis with iron overload homozygous for C282Y mutation goal ferritin <150, has established with heme (Dr Federico). Last seen last week, note reviewed. Planning to start blood draws this week for elevated ferritin levels.    S/p R hip replacement 04/2021 [facility]).  Woke up today with left facial pain/pressure.    Preventative: COLONOSCOPY 12/2016 WNL [place]).  Prostate cancer screening - continue yearly PSA - brother developed prostate cancer at age 3 yo.  Lung cancer screening - not eligible  Flu shot yearly  COVID vaccine - Pfizer 08/2019 x2, booster 03/2021. 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  Advanced directive - brings in today, reviewed and  sent for scanning 12/2023. Would want 2 sons Kenta Laster and Fonda to be HCPOA. Does not want prolonged life support if terminal condition. Wants living will followed.  Seat belt use discussed.  Sunscreen use discussed. No changing moles on skin.  Sleep - averaging 7 hours/night Ex smoker - quit 2005  Alcohol - 2-3 beers a few times a week Dentist - q6 mo Eye exam - sees retinologist Alvia yearly (retinitis pigmentosa)  Bowel - no constipation - continues probiotic with benefit  Bladder - incontinence while ill, this has resolved   Caffeine: 4-6 cups coffee  Separated from wife  Lives with 1 son, has other grown children, dog, chickens and cows Occupation: on disability for vision loss, legally blind, raises cows Edu: HS  Activity: stays active on farm, raises cows, some hunting/fishing  Diet: good water , daily fruits/vegetables      Relevant past medical, surgical, family and social history reviewed and updated as indicated. Interim medical history since our last visit reviewed. Allergies and medications reviewed and updated. Outpatient Medications Prior to Visit  Medication Sig Dispense Refill   pilocarpine  (PILOCAR) 1 % ophthalmic solution Place 1 drop into the left eye 2 (two) times daily.      Probiotic Product (PROBIOTIC  DAILY PO) Take by mouth.     amLODipine  (NORVASC ) 5 MG tablet Take 1 tablet (5 mg total) by mouth daily. 90 tablet 3   No facility-administered medications prior to visit.     Per HPI unless specifically indicated in ROS section below Review of Systems  Constitutional:  Negative for activity change, appetite change, chills, fatigue, fever and unexpected weight change.  HENT:  Negative for hearing loss.   Eyes:  Negative for visual disturbance.  Respiratory:  Negative for cough, chest tightness, shortness of breath and wheezing.   Cardiovascular:  Negative for chest pain, palpitations and leg swelling.  Gastrointestinal:  Negative for abdominal  distention, abdominal pain, blood in stool, constipation, diarrhea, nausea and vomiting.  Genitourinary:  Negative for difficulty urinating and hematuria.  Musculoskeletal:  Negative for arthralgias, myalgias and neck pain.  Skin:  Negative for rash.  Neurological:  Negative for dizziness, seizures, syncope and headaches.  Hematological:  Negative for adenopathy. Does not bruise/bleed easily.  Psychiatric/Behavioral:  Negative for dysphoric mood. The patient is not nervous/anxious.     Objective:  BP 138/68   Pulse 90   Temp 98.9 F (37.2 C) (Oral)   Ht 5' 9 (1.753 m)   Wt 196 lb 6.4 oz (89.1 kg)   SpO2 98%   BMI 29.00 kg/m   Wt Readings from Last 3 Encounters:  01/01/24 196 lb 6.4 oz (89.1 kg)  12/27/23 193 lb (87.5 kg)  12/26/23 193 lb (87.5 kg)      Physical Exam Vitals and nursing note reviewed.  Constitutional:      General: He is not in acute distress.    Appearance: Normal appearance. He is well-developed. He is not ill-appearing.  HENT:     Head: Normocephalic and atraumatic.     Right Ear: Hearing, tympanic membrane, ear canal and external ear normal.     Left Ear: Hearing, tympanic membrane, ear canal and external ear normal.     Nose:     Right Sinus: No maxillary sinus tenderness or frontal sinus tenderness.     Left Sinus: Maxillary sinus tenderness (mild) present. No frontal sinus tenderness.     Mouth/Throat:     Mouth: Mucous membranes are moist.     Pharynx: Oropharynx is clear. No oropharyngeal exudate or posterior oropharyngeal erythema.  Eyes:     General: No scleral icterus.    Extraocular Movements: Extraocular movements intact.     Conjunctiva/sclera: Conjunctivae normal.     Pupils: Pupils are equal, round, and reactive to light.  Neck:     Thyroid : No thyroid  mass or thyromegaly.  Cardiovascular:     Rate and Rhythm: Normal rate and regular rhythm.     Pulses: Normal pulses.          Radial pulses are 2+ on the right side and 2+ on the left  side.     Heart sounds: Normal heart sounds. No murmur heard. Pulmonary:     Effort: Pulmonary effort is normal. No respiratory distress.     Breath sounds: Normal breath sounds. No wheezing, rhonchi or rales.  Abdominal:     General: Bowel sounds are normal. There is no distension.     Palpations: Abdomen is soft. There is no mass.     Tenderness: There is no abdominal tenderness. There is no guarding or rebound.     Hernia: No hernia is present.  Musculoskeletal:        General: Normal range of motion.  Cervical back: Normal range of motion and neck supple.     Right lower leg: No edema.     Left lower leg: No edema.  Lymphadenopathy:     Cervical: No cervical adenopathy.  Skin:    General: Skin is warm and dry.     Findings: No rash.  Neurological:     General: No focal deficit present.     Mental Status: He is alert and oriented to person, place, and time.  Psychiatric:        Mood and Affect: Mood normal.        Behavior: Behavior normal.        Thought Content: Thought content normal.        Judgment: Judgment normal.       Results for orders placed or performed in visit on 12/25/23  Iron and Iron Binding Capacity ([facility] only)   Collection Time: 12/25/23  1:49 PM  Result Value Ref Range   Iron 223 (H) 45 - 182 ug/dL   TIBC 758 (L) 749 - 549 ug/dL   Saturation Ratios 93 (H) 17.9 - 39.5 %   UIBC 18 (L) 117 - 376 ug/dL  CMP ([facility] only)   Collection Time: 12/25/23  1:49 PM  Result Value Ref Range   Sodium 141 135 - 145 mmol/L   Potassium 4.3 3.5 - 5.1 mmol/L   Chloride 107 98 - 111 mmol/L   CO2 29 22 - 32 mmol/L   Glucose, Bld 107 (H) 70 - 99 mg/dL   BUN 14 6 - 20 mg/dL   Creatinine 9.08 9.38 - 1.24 mg/dL   Calcium 9.1 8.9 - 89.6 mg/dL   Total Protein 6.5 6.5 - 8.1 g/dL   Albumin 4.0 3.5 - 5.0 g/dL   AST 23 15 - 41 U/L   ALT 25 0 - 44 U/L   Alkaline Phosphatase 71 38 - 126 U/L   Total Bilirubin 1.7 (H) 0.0 - 1.2 mg/dL   GFR, Estimated >39 >39  mL/min   Anion gap 5 5 - 15  CBC with Differential ([facility] Center Only)   Collection Time: 12/25/23  1:49 PM  Result Value Ref Range   WBC Count 6.4 4.0 - 10.5 K/uL   RBC 4.48 4.22 - 5.81 MIL/uL   Hemoglobin 15.2 13.0 - 17.0 g/dL   HCT 57.5 60.9 - 47.9 %   MCV 94.6 80.0 - 100.0 fL   MCH 33.9 26.0 - 34.0 pg   MCHC 35.8 30.0 - 36.0 g/dL   RDW 86.8 88.4 - 84.4 %   Platelet Count 218 150 - 400 K/uL   nRBC 0.0 0.0 - 0.2 %   Neutrophils Relative % 56 %   Neutro Abs 3.6 1.7 - 7.7 K/uL   Lymphocytes Relative 30 %   Lymphs Abs 1.9 0.7 - 4.0 K/uL   Monocytes Relative 7 %   Monocytes Absolute 0.4 0.1 - 1.0 K/uL   Eosinophils Relative 6 %   Eosinophils Absolute 0.4 0.0 - 0.5 K/uL   Basophils Relative 1 %   Basophils Absolute 0.1 0.0 - 0.1 K/uL   Immature Granulocytes 0 %   Abs Immature Granulocytes 0.02 0.00 - 0.07 K/uL  Ferritin   Collection Time: 12/25/23  1:50 PM  Result Value Ref Range   Ferritin 215 24 - 336 ng/mL    Assessment & Plan:   Problem List Items Addressed This Visit     Advanced care planning/counseling discussion (Chronic)   Advanced directive - brings  in today, reviewed and sent for scanning 12/2023. Would want 2 sons Hesham Womac and Fonda to be HCPOA. Does not want prolonged life support if terminal condition. Wants living will followed.       Health maintenance examination - Primary (Chronic)   Preventative protocols reviewed and updated unless pt declined. Discussed healthy diet and lifestyle.       Retinitis pigmentosa   Hereditary hemochromatosis (HCC)   Appreciate hematology - planning to restart blood draws      Dyslipidemia   Chronic, levels stable off medication. The ASCVD Risk score (Arnett DK, et al., 2019) failed to calculate for the following reasons:   The valid total cholesterol range is 130 to 320 mg/dL       Subclinical hypothyroidism   Chronic, stable off medication. TFTs normalized       AK (actinic keratosis)   Has scheduled  dermatology appt for 03/2024. Took >25yr to get appt.       Hypertension   Chronic, stable. Continue current regimen.       Relevant Medications   amLODipine  (NORVASC ) 5 MG tablet   CAP (community acquired pneumonia)   Legionnaire's disease (HCC)   Continued fatigue but continues improving. Update CXR today         Meds ordered this encounter  Medications   amLODipine  (NORVASC ) 5 MG tablet    Sig: Take 1 tablet (5 mg total) by mouth daily.    Dispense:  90 tablet    Refill:  4    No orders of the defined types were placed in this encounter.   Patient Instructions  Chest xray today  May take tylenol  for left facial pain. Let us  know if ongoing or progressive symptoms ie spreading redness to left cheek, fever, congestion develops.  Good to see you today Return as needed or in 1 year for next physical/wellness visit  Follow up plan: Return in about 1 year (around 12/31/2024) for annual exam, prior fasting for blood work.  Anton Blas, MD

## 2024-01-01 NOTE — Assessment & Plan Note (Signed)
 Has scheduled dermatology appt for 03/2024. Took >34yr to get appt.

## 2024-01-01 NOTE — Assessment & Plan Note (Signed)
 Chronic, levels stable off medication. The ASCVD Risk score (Arnett DK, et al., 2019) failed to calculate for the following reasons:   The valid total cholesterol range is 130 to 320 mg/dL

## 2024-01-01 NOTE — Assessment & Plan Note (Addendum)
 Chronic, stable off medication. TFTs normalized

## 2024-01-03 ENCOUNTER — Telehealth: Payer: Self-pay

## 2024-01-03 ENCOUNTER — Inpatient Hospital Stay

## 2024-01-03 ENCOUNTER — Other Ambulatory Visit: Payer: Self-pay | Admitting: *Deleted

## 2024-01-03 DIAGNOSIS — A481 Legionnaires' disease: Secondary | ICD-10-CM | POA: Diagnosis not present

## 2024-01-03 DIAGNOSIS — Z79899 Other long term (current) drug therapy: Secondary | ICD-10-CM | POA: Diagnosis not present

## 2024-01-03 DIAGNOSIS — Z87891 Personal history of nicotine dependence: Secondary | ICD-10-CM | POA: Diagnosis not present

## 2024-01-03 DIAGNOSIS — H3552 Pigmentary retinal dystrophy: Secondary | ICD-10-CM | POA: Diagnosis not present

## 2024-01-03 DIAGNOSIS — Z806 Family history of leukemia: Secondary | ICD-10-CM | POA: Diagnosis not present

## 2024-01-03 DIAGNOSIS — M129 Arthropathy, unspecified: Secondary | ICD-10-CM | POA: Diagnosis not present

## 2024-01-03 DIAGNOSIS — Z800 Family history of malignant neoplasm of digestive organs: Secondary | ICD-10-CM | POA: Diagnosis not present

## 2024-01-03 DIAGNOSIS — Z803 Family history of malignant neoplasm of breast: Secondary | ICD-10-CM | POA: Diagnosis not present

## 2024-01-03 LAB — CBC WITH DIFFERENTIAL (CANCER CENTER ONLY)
Abs Immature Granulocytes: 0.04 K/uL (ref 0.00–0.07)
Basophils Absolute: 0.1 K/uL (ref 0.0–0.1)
Basophils Relative: 1 %
Eosinophils Absolute: 0.3 K/uL (ref 0.0–0.5)
Eosinophils Relative: 4 %
HCT: 44.1 % (ref 39.0–52.0)
Hemoglobin: 15.9 g/dL (ref 13.0–17.0)
Immature Granulocytes: 1 %
Lymphocytes Relative: 24 %
Lymphs Abs: 2.1 K/uL (ref 0.7–4.0)
MCH: 34.3 pg — ABNORMAL HIGH (ref 26.0–34.0)
MCHC: 36.1 g/dL — ABNORMAL HIGH (ref 30.0–36.0)
MCV: 95 fL (ref 80.0–100.0)
Monocytes Absolute: 0.6 K/uL (ref 0.1–1.0)
Monocytes Relative: 7 %
Neutro Abs: 5.6 K/uL (ref 1.7–7.7)
Neutrophils Relative %: 63 %
Platelet Count: 264 K/uL (ref 150–400)
RBC: 4.64 MIL/uL (ref 4.22–5.81)
RDW: 13.2 % (ref 11.5–15.5)
WBC Count: 8.7 K/uL (ref 4.0–10.5)
nRBC: 0 % (ref 0.0–0.2)

## 2024-01-03 NOTE — Progress Notes (Signed)
 Therapeutic phlebotomy performed per MD orders. 16G kit utilized on RAC starting at 1545 and ending at 1552 with 513 grams removed. Patient given a beverage and monitored for 15 minute post observation period without incident. VSS, ambulatory to the lobby.

## 2024-01-03 NOTE — Patient Instructions (Signed)

## 2024-01-03 NOTE — Transitions of Care (Post Inpatient/ED Visit) (Signed)
  Transition of Care Week 5 final  Visit Note  01/03/2024  Name: Michael Vaughan MRN: [id number]          DOB: 02/02/1966  Situation: Patient enrolled in [facility] 30-day program. Visit completed with patient by telephone.   Background:   Initial Transition Care Management Follow-up Telephone Call    Past Medical History:  Diagnosis Date   Allergy    Arthralgia    takes aleve regularly   Arthritis    Cataract    Heartburn    Hereditary hemochromatosis (HCC) 05/2013   iron overload with mild HM, homozygous for C282Y, monthly blood draws   History of chicken pox    History of migraine 1990s   remote   Retinitis pigmentosa     Assessment: Patient Reported Symptoms: Cognitive Cognitive Status: Alert and oriented to person, place, and time      Neurological Neurological Review of Symptoms: No symptoms reported Neurological Self-Management Outcome: 4 (good)  HEENT HEENT Symptoms Reported: No symptoms reported HEENT Self-Management Outcome: 4 (good)    Cardiovascular Cardiovascular Symptoms Reported: No symptoms reported (improving energy) Does patient have uncontrolled Hypertension?: Yes    Respiratory Respiratory Symptoms Reported: No symptoms reported Respiratory Management Strategies: Activity, Adequate rest, Breathing exercise Respiratory Self-Management Outcome: 4 (good)  Endocrine Endocrine Symptoms Reported: No symptoms reported Is patient diabetic?: No Endocrine Self-Management Outcome: 4 (good)  Gastrointestinal Gastrointestinal Symptoms Reported: No symptoms reported Gastrointestinal Self-Management Outcome: 4 (good)    Genitourinary Genitourinary Symptoms Reported: No symptoms reported Genitourinary Self-Management Outcome: 4 (good)  Integumentary Integumentary Symptoms Reported: No symptoms reported Skin Self-Management Outcome: 4 (good)  Musculoskeletal Musculoskelatal Symptoms Reviewed: No symptoms reported Additional Musculoskeletal Details: chronic knee -  good today Musculoskeletal Self-Management Outcome: 4 (good) Falls in the past year?: No Patient at Risk for Falls Due to: No Fall Risks  Psychosocial Psychosocial Symptoms Reported: No symptoms reported Behavioral Management Strategies: Activity, Adequate rest Behavioral Health Self-Management Outcome: 4 (good)       Vitals:   01/03/24 1350  Temp: 97.6 F (36.4 C)  SpO2: 95%    Medications Reviewed Today     Reviewed by Eilleen Richerd GRADE, RN (Registered Nurse) on 01/03/24 at 1350  Med List Status: <None>   Medication Order Taking? Sig Documenting Provider Last Dose Status Informant  amLODipine  (NORVASC ) 5 MG tablet [id number] Yes Take 1 tablet (5 mg total) by mouth daily. Rilla Baller, MD  Active   pilocarpine  [facility]) 1 % ophthalmic solution [id number] Yes Place 1 drop into the left eye 2 (two) times daily.  [provider]  Active Spouse/Significant Other  Probiotic Product (PROBIOTIC DAILY PO) [id number] Yes Take by mouth. [provider]  Active             Recommendation:   PCP Follow-up for any questions or concerns/needs.  Follow Up Plan:   Closing From:  Transitions of Care Program Patient has met all care management goals. Care Management case will be closed. Patient has been provided contact information should new needs arise.   Richerd Eilleen, RN, BSN, CCM [facility], [facility] Health RN Care Manager Direct Dial: [phone]

## 2024-01-03 NOTE — Patient Instructions (Addendum)
 Visit Information  Thank you for taking time to visit with me today. Please don't hesitate to contact me if I can be of assistance to you before our next scheduled telephone appointment.  30 day program completed  Following is a copy of your care plan:   Goals Addressed             This Visit's Progress    VBCI Transitions of Care (TOC) Care Plan   On track    Problems: (Reviewed with patient 01/03/24) Recent Hospitalization for treatment of Pulmonary Disease and Severe Sepsis due to pneumonia Knowledge Deficit Related to Legionella pneumonia (patient states MD - Dr. 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 BP cuff stopped working, stable BP at appointment  Goal:  Over the next 30 days, the patient will not experience hospital readmission  Interventions:  Transitions of Care: Doctor Visits  - discussed the importance of doctor visits Reviewed Signs and symptoms of infection 12/27/23 improving- no coughing, completed antibiotic Continue to monitor energy BP cuff not working - trying to check for potential reset or new cuff 01/03/24 Encouraged to check states BP has been 130's/60's - doing good  Patient Self Care Activities:  Attend all scheduled provider appointments Call pharmacy for medication refills 3-7 days in advance of running out of medications Call provider office for new concerns or questions  Notify RN Care Manager of TOC call rescheduling needs Participate in Transition of Care Program/Attend TOC scheduled calls Taking a probiotic - 12/20/23 Probiotics generic OTC [employer] brand working, no GI issues Bought disposable and N95 Mask from [employer] (while working in the yard and with chickens) 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 Caregiver to wife, take breaks, working on preserving energy and yet trying to do more Continue to progress and call MD for any rebound symptoms has follow up appointments Discussed Care Gaps at each visit, patient states he will decide on Tdap and Hep B, was told at visit past Monday the MD office will be doing a vaccine event but he may choose to get it at his pharmacy if he decides to get it. Patient denies any further needs and declines longitudinal care management at this time. Informed he can contact PCP office if changes his mind.        Patient verbalizes understanding of instructions and care plan provided today and agrees to view in MyChart. Active MyChart status and patient understanding of how to access instructions and care plan via MyChart confirmed with patient.     No further follow up required: Patient has completed 30 day TOC program, currently declines the need for complex care management  Please call the care guide team at [phone] if you need to cancel or reschedule your appointment.   Please call the [place]  National Suicide Prevention Lifeline: 332 811 3945 or TTY: (618) 220-3855 TTY 201-333-1791) to talk to a trained counselor call 1-800-273-TALK (toll free, 24 hour hotline) call 911 if you are experiencing a Mental Health or Behavioral Health Crisis or need someone to talk to.  Richerd Fish, RN, BSN, CCM [facility], [facility] Health RN Care Manager Direct Dial: [phone]

## 2024-01-04 LAB — FERRITIN: Ferritin: 162 ng/mL (ref 24–336)

## 2024-01-10 ENCOUNTER — Inpatient Hospital Stay

## 2024-01-17 ENCOUNTER — Inpatient Hospital Stay: Attending: Hematology and Oncology

## 2024-01-17 ENCOUNTER — Inpatient Hospital Stay

## 2024-01-17 DIAGNOSIS — Z79899 Other long term (current) drug therapy: Secondary | ICD-10-CM | POA: Insufficient documentation

## 2024-01-17 LAB — CBC WITH DIFFERENTIAL (CANCER CENTER ONLY)
Abs Immature Granulocytes: 0.01 K/uL (ref 0.00–0.07)
Basophils Absolute: 0.1 K/uL (ref 0.0–0.1)
Basophils Relative: 1 %
Eosinophils Absolute: 0.4 K/uL (ref 0.0–0.5)
Eosinophils Relative: 5 %
HCT: 40.8 % (ref 39.0–52.0)
Hemoglobin: 14.9 g/dL (ref 13.0–17.0)
Immature Granulocytes: 0 %
Lymphocytes Relative: 24 %
Lymphs Abs: 1.8 K/uL (ref 0.7–4.0)
MCH: 34.3 pg — ABNORMAL HIGH (ref 26.0–34.0)
MCHC: 36.5 g/dL — ABNORMAL HIGH (ref 30.0–36.0)
MCV: 94 fL (ref 80.0–100.0)
Monocytes Absolute: 0.5 K/uL (ref 0.1–1.0)
Monocytes Relative: 6 %
Neutro Abs: 4.7 K/uL (ref 1.7–7.7)
Neutrophils Relative %: 64 %
Platelet Count: 246 K/uL (ref 150–400)
RBC: 4.34 MIL/uL (ref 4.22–5.81)
RDW: 14 % (ref 11.5–15.5)
WBC Count: 7.3 K/uL (ref 4.0–10.5)
nRBC: 0 % (ref 0.0–0.2)

## 2024-01-17 LAB — FERRITIN: Ferritin: 112 ng/mL (ref 24–336)

## 2024-01-17 NOTE — Progress Notes (Signed)
 Michael Vaughan presents today for phlebotomy per MD orders. First attempt phlebotomy procedure R ac started at 1545 and ended at 1550. 200 grams removed. Pt began to bleed around phlebotomy needle. Needle removed. Second attempt phlebotomy procedure L ac with 18g started at 1557 and ended at 1559. 30 grams removed. Pt clotted, line flushed but unable to obtain adequate blood return. Line removed. Pt requested third attempt phlebotomy procedure R fa started at 1613 and ended at 1618. 275 grams removed. Patient observed for 30 minutes after procedure without any incident. Beverage provided. Patient tolerated procedure well. IV needle removed intact. VS WNL. Ambulatory to lobby.

## 2024-01-17 NOTE — Patient Instructions (Signed)

## 2024-01-24 ENCOUNTER — Inpatient Hospital Stay

## 2024-01-31 ENCOUNTER — Inpatient Hospital Stay

## 2024-01-31 DIAGNOSIS — Z79899 Other long term (current) drug therapy: Secondary | ICD-10-CM | POA: Diagnosis not present

## 2024-01-31 LAB — CBC WITH DIFFERENTIAL (CANCER CENTER ONLY)
Abs Immature Granulocytes: 0.02 K/uL (ref 0.00–0.07)
Basophils Absolute: 0.1 K/uL (ref 0.0–0.1)
Basophils Relative: 1 %
Eosinophils Absolute: 0.4 K/uL (ref 0.0–0.5)
Eosinophils Relative: 5 %
HCT: 41.4 % (ref 39.0–52.0)
Hemoglobin: 15.2 g/dL (ref 13.0–17.0)
Immature Granulocytes: 0 %
Lymphocytes Relative: 26 %
Lymphs Abs: 2.2 K/uL (ref 0.7–4.0)
MCH: 34.5 pg — ABNORMAL HIGH (ref 26.0–34.0)
MCHC: 36.7 g/dL — ABNORMAL HIGH (ref 30.0–36.0)
MCV: 93.9 fL (ref 80.0–100.0)
Monocytes Absolute: 0.6 K/uL (ref 0.1–1.0)
Monocytes Relative: 7 %
Neutro Abs: 5 K/uL (ref 1.7–7.7)
Neutrophils Relative %: 61 %
Platelet Count: 284 K/uL (ref 150–400)
RBC: 4.41 MIL/uL (ref 4.22–5.81)
RDW: 13.3 % (ref 11.5–15.5)
WBC Count: 8.2 K/uL (ref 4.0–10.5)
nRBC: 0 % (ref 0.0–0.2)

## 2024-01-31 NOTE — Progress Notes (Signed)
 Per Dr. Federico and Landry Arabia, no phlebotomy needed today due to ferritin levels at 112 on 01/17/2024. See note from Dr. Federico on 12/25/2023 for parameters.

## 2024-02-01 LAB — FERRITIN: Ferritin: 55 ng/mL (ref 24–336)

## 2024-02-07 ENCOUNTER — Inpatient Hospital Stay

## 2024-02-14 ENCOUNTER — Inpatient Hospital Stay

## 2024-02-14 ENCOUNTER — Inpatient Hospital Stay: Attending: Hematology and Oncology

## 2024-02-14 LAB — CBC WITH DIFFERENTIAL (CANCER CENTER ONLY)
Abs Immature Granulocytes: 0.02 K/uL (ref 0.00–0.07)
Basophils Absolute: 0.1 K/uL (ref 0.0–0.1)
Basophils Relative: 1 %
Eosinophils Absolute: 0.3 K/uL (ref 0.0–0.5)
Eosinophils Relative: 4 %
HCT: 42.8 % (ref 39.0–52.0)
Hemoglobin: 15.2 g/dL (ref 13.0–17.0)
Immature Granulocytes: 0 %
Lymphocytes Relative: 27 %
Lymphs Abs: 2.1 K/uL (ref 0.7–4.0)
MCH: 33.4 pg (ref 26.0–34.0)
MCHC: 35.5 g/dL (ref 30.0–36.0)
MCV: 94.1 fL (ref 80.0–100.0)
Monocytes Absolute: 0.5 K/uL (ref 0.1–1.0)
Monocytes Relative: 6 %
Neutro Abs: 4.8 K/uL (ref 1.7–7.7)
Neutrophils Relative %: 62 %
Platelet Count: 254 K/uL (ref 150–400)
RBC: 4.55 MIL/uL (ref 4.22–5.81)
RDW: 12.8 % (ref 11.5–15.5)
WBC Count: 7.7 K/uL (ref 4.0–10.5)
nRBC: 0 % (ref 0.0–0.2)

## 2024-02-14 NOTE — Patient Instructions (Signed)

## 2024-02-14 NOTE — Progress Notes (Signed)
 Michael Vaughan presents today for phlebotomy per MD orders. Phlebotomy procedure started at 1542 and ended at 1546. 512 grams removed. 16G Phlebotomy kit used to R AC. Patient observed for 30 minutes after procedure without any incident. Patient tolerated procedure well. Beverage provided.  IV needle removed intact.

## 2024-02-15 LAB — FERRITIN: Ferritin: 54 ng/mL (ref 24–336)

## 2024-02-21 ENCOUNTER — Inpatient Hospital Stay

## 2024-02-28 ENCOUNTER — Inpatient Hospital Stay

## 2024-02-28 LAB — CBC WITH DIFFERENTIAL (CANCER CENTER ONLY)
Abs Immature Granulocytes: 0.02 K/uL (ref 0.00–0.07)
Basophils Absolute: 0.1 K/uL (ref 0.0–0.1)
Basophils Relative: 1 %
Eosinophils Absolute: 0.3 K/uL (ref 0.0–0.5)
Eosinophils Relative: 3 %
HCT: 38.5 % — ABNORMAL LOW (ref 39.0–52.0)
Hemoglobin: 14.1 g/dL (ref 13.0–17.0)
Immature Granulocytes: 0 %
Lymphocytes Relative: 24 %
Lymphs Abs: 2 K/uL (ref 0.7–4.0)
MCH: 33.5 pg (ref 26.0–34.0)
MCHC: 36.6 g/dL — ABNORMAL HIGH (ref 30.0–36.0)
MCV: 91.4 fL (ref 80.0–100.0)
Monocytes Absolute: 0.5 K/uL (ref 0.1–1.0)
Monocytes Relative: 5 %
Neutro Abs: 5.7 K/uL (ref 1.7–7.7)
Neutrophils Relative %: 67 %
Platelet Count: 271 K/uL (ref 150–400)
RBC: 4.21 MIL/uL — ABNORMAL LOW (ref 4.22–5.81)
RDW: 12.4 % (ref 11.5–15.5)
WBC Count: 8.6 K/uL (ref 4.0–10.5)
nRBC: 0 % (ref 0.0–0.2)

## 2024-02-28 NOTE — Progress Notes (Signed)
 Ubaldo Husband presents today for phlebotomy per MD orders. Phlebotomy procedure  clotted x3 re-started to right ac with 16 gauge phlebotomy kit at 4:14pm and ended at 4:20pm 500 total grams removed. Patient observed for after procedure  Pt declined to stay for full 30 minutes.without any incident. Beverage provided. Patient tolerated procedure well. IV needle removed intact.

## 2024-02-29 LAB — FERRITIN: Ferritin: 31 ng/mL (ref 24–336)

## 2024-03-01 ENCOUNTER — Ambulatory Visit: Payer: Self-pay | Admitting: *Deleted

## 2024-03-01 NOTE — Telephone Encounter (Signed)
 [name] regarding recent lab results.  Spoke with him. Advised that his Ferritin hit target <50. Dr. Federico has advised we can hold his phlebotomies. Please let patient know and request schedule for labs in 3 months with clinic visit . Appts for future phlebotomies have been canceled. Pt prefers to keep his next appt on 06/19/24

## 2024-03-01 NOTE — Telephone Encounter (Signed)
-----   Message from Norleen ONEIDA Kidney IV sent at 02/29/2024  9:41 AM EDT ----- Ferritin hit target <50. OK to hold phlebotomies. Please let patient know and request schedule for labs in 3 months with clinic visit  ----- Message ----- From: Interface, Lab [facility] [place] Sent: 02/28/2024   3:13 PM EDT To: Norleen ONEIDA Kidney [place], MD

## 2024-03-05 ENCOUNTER — Emergency Department (HOSPITAL_BASED_OUTPATIENT_CLINIC_OR_DEPARTMENT_OTHER)
Admission: EM | Admit: 2024-03-05 | Discharge: 2024-03-05 | Disposition: A | Discharge status: Home / Self Care | Attending: Emergency Medicine | Admitting: Emergency Medicine

## 2024-03-05 ENCOUNTER — Emergency Department (HOSPITAL_BASED_OUTPATIENT_CLINIC_OR_DEPARTMENT_OTHER)

## 2024-03-05 ENCOUNTER — Encounter (HOSPITAL_BASED_OUTPATIENT_CLINIC_OR_DEPARTMENT_OTHER): Payer: Self-pay | Admitting: Emergency Medicine

## 2024-03-05 ENCOUNTER — Other Ambulatory Visit: Payer: Self-pay

## 2024-03-05 DIAGNOSIS — Q630 Accessory kidney: Secondary | ICD-10-CM | POA: Diagnosis not present

## 2024-03-05 DIAGNOSIS — N132 Hydronephrosis with renal and ureteral calculous obstruction: Secondary | ICD-10-CM | POA: Diagnosis not present

## 2024-03-05 DIAGNOSIS — R079 Chest pain, unspecified: Secondary | ICD-10-CM | POA: Diagnosis not present

## 2024-03-05 DIAGNOSIS — N200 Calculus of kidney: Secondary | ICD-10-CM

## 2024-03-05 DIAGNOSIS — I10 Essential (primary) hypertension: Secondary | ICD-10-CM | POA: Diagnosis not present

## 2024-03-05 DIAGNOSIS — R109 Unspecified abdominal pain: Secondary | ICD-10-CM | POA: Diagnosis not present

## 2024-03-05 LAB — CBC WITH DIFFERENTIAL/PLATELET
Abs Immature Granulocytes: 0.05 K/uL (ref 0.00–0.07)
Basophils Absolute: 0.1 K/uL (ref 0.0–0.1)
Basophils Relative: 1 %
Eosinophils Absolute: 0.1 K/uL (ref 0.0–0.5)
Eosinophils Relative: 1 %
HCT: 39 % (ref 39.0–52.0)
Hemoglobin: 14 g/dL (ref 13.0–17.0)
Immature Granulocytes: 1 %
Lymphocytes Relative: 11 %
Lymphs Abs: 1.1 K/uL (ref 0.7–4.0)
MCH: 32.9 pg (ref 26.0–34.0)
MCHC: 35.9 g/dL (ref 30.0–36.0)
MCV: 91.8 fL (ref 80.0–100.0)
Monocytes Absolute: 0.7 K/uL (ref 0.1–1.0)
Monocytes Relative: 7 %
Neutro Abs: 8.5 K/uL — ABNORMAL HIGH (ref 1.7–7.7)
Neutrophils Relative %: 79 %
Platelets: 264 K/uL (ref 150–400)
RBC: 4.25 MIL/uL (ref 4.22–5.81)
RDW: 12 % (ref 11.5–15.5)
WBC: 10.5 K/uL (ref 4.0–10.5)
nRBC: 0 % (ref 0.0–0.2)

## 2024-03-05 LAB — COMPREHENSIVE METABOLIC PANEL WITH GFR
ALT: 28 U/L (ref 0–44)
AST: 32 U/L (ref 15–41)
Albumin: 4.4 g/dL (ref 3.5–5.0)
Alkaline Phosphatase: 64 U/L (ref 38–126)
Anion gap: 15 (ref 5–15)
BUN: 15 mg/dL (ref 6–20)
CO2: 20 mmol/L — ABNORMAL LOW (ref 22–32)
Calcium: 9.6 mg/dL (ref 8.9–10.3)
Chloride: 103 mmol/L (ref 98–111)
Creatinine, Ser: 1.3 mg/dL — ABNORMAL HIGH (ref 0.61–1.24)
GFR, Estimated: >60 mL/min (ref >60)
Glucose, Bld: 115 mg/dL — ABNORMAL HIGH (ref 70–99)
Potassium: 3.7 mmol/L (ref 3.5–5.1)
Sodium: 137 mmol/L (ref 135–145)
Total Bilirubin: 2.3 mg/dL — ABNORMAL HIGH (ref 0.0–1.2)
Total Protein: 6.8 g/dL (ref 6.5–8.1)

## 2024-03-05 LAB — URINALYSIS, ROUTINE W REFLEX MICROSCOPIC
Bilirubin Urine: NEGATIVE
Glucose, UA: NEGATIVE mg/dL
Hgb urine dipstick: NEGATIVE
Ketones, ur: 15 mg/dL — AB
Leukocytes,Ua: NEGATIVE
Nitrite: NEGATIVE
Protein, ur: NEGATIVE mg/dL
Specific Gravity, Urine: <1.005 — ABNORMAL LOW (ref 1.005–1.030)
pH: 6.5 (ref 5.0–8.0)

## 2024-03-05 LAB — TROPONIN T, HIGH SENSITIVITY: Troponin T High Sensitivity: <15 ng/L (ref 0–19)

## 2024-03-05 LAB — LIPASE, BLOOD: Lipase: 14 U/L (ref 11–51)

## 2024-03-05 MED ORDER — HYDROMORPHONE HCL 1 MG/ML IJ SOLN
1.0000 mg | Freq: Once | INTRAMUSCULAR | Status: AC
  Administered 2024-03-05: 1 mg via INTRAVENOUS
  Filled 2024-03-05: qty 1

## 2024-03-05 MED ORDER — ONDANSETRON 4 MG PO TBDP: 4.0000 mg | ORAL_TABLET | Freq: Three times a day (TID) | ORAL | 0 refills | Status: DC | PRN

## 2024-03-05 MED ORDER — SODIUM CHLORIDE 0.9 % IV BOLUS
1000.0000 mL | Freq: Once | INTRAVENOUS | Status: AC
  Administered 2024-03-05: 1000 mL via INTRAVENOUS

## 2024-03-05 MED ORDER — HYDROMORPHONE HCL 1 MG/ML IJ SOLN
1.0000 mg | Freq: Once | INTRAMUSCULAR | Status: AC
Start: 2024-03-05 — End: 2024-03-05
  Administered 2024-03-05: 1 mg via INTRAVENOUS
  Filled 2024-03-05: qty 1

## 2024-03-05 MED ORDER — IOHEXOL 350 MG/ML SOLN
100.0000 mL | Freq: Once | INTRAVENOUS | Status: AC | PRN
  Administered 2024-03-05: 100 mL via INTRAVENOUS

## 2024-03-05 MED ORDER — KETOROLAC TROMETHAMINE 15 MG/ML IJ SOLN
15.0000 mg | Freq: Once | INTRAMUSCULAR | Status: AC
  Administered 2024-03-05: 15 mg via INTRAVENOUS
  Filled 2024-03-05: qty 1

## 2024-03-05 MED ORDER — TAMSULOSIN HCL 0.4 MG PO CAPS: 0.4000 mg | ORAL_CAPSULE | Freq: Every day | ORAL | 0 refills | Status: AC

## 2024-03-05 MED ORDER — FENTANYL CITRATE PF 50 MCG/ML IJ SOSY
50.0000 ug | PREFILLED_SYRINGE | Freq: Once | INTRAMUSCULAR | Status: AC
  Administered 2024-03-05: 50 ug via INTRAVENOUS
  Filled 2024-03-05: qty 1

## 2024-03-05 MED ORDER — ONDANSETRON HCL 4 MG/2ML IJ SOLN
4.0000 mg | Freq: Once | INTRAMUSCULAR | Status: AC
Start: 2024-03-05 — End: 2024-03-05
  Administered 2024-03-05: 4 mg via INTRAVENOUS
  Filled 2024-03-05: qty 2

## 2024-03-05 MED ORDER — OXYCODONE HCL 5 MG PO TABS: 5.0000 mg | ORAL_TABLET | ORAL | 0 refills | Status: DC | PRN

## 2024-03-05 NOTE — ED Provider Notes (Signed)
 [place] EMERGENCY DEPARTMENT AT [facility] Provider Note   CSN: [id number] Arrival date & time: 03/05/24  1349     Patient presents with: Abdominal Pain   Michael Vaughan is a 58 y.o. male.   Left-sided abdominal pain into his back since last night.  History of high blood pressure.  Denies any chest pain.  Denies any any diarrhea or constipation.  Has had some nausea.  No new shortness of breath.  Denies any black or bloody stools.  History of hereditary hemochromatosis.  Denies any weakness numbness tingling.  Denies any pain urination.  No history of kidney stones.  No history of diverticulitis.  The history is provided by the patient.       Prior to Admission medications   Medication Sig Start Date End Date Taking? Authorizing Provider  ondansetron  (ZOFRAN -ODT) 4 MG disintegrating tablet Take 1 tablet (4 mg total) by mouth every 8 (eight) hours as needed. 03/05/24  Yes Mianna Iezzi, DO  oxyCODONE  (ROXICODONE ) 5 MG immediate release tablet Take 1 tablet (5 mg total) by mouth every 4 (four) hours as needed for up to 15 doses for breakthrough pain. 03/05/24  Yes Toree Edling, DO  tamsulosin  (FLOMAX ) 0.4 MG CAPS capsule Take 1 capsule (0.4 mg total) by mouth daily for 7 days. 03/05/24 03/12/24 Yes Tad Fancher, DO  amLODipine  (NORVASC ) 5 MG tablet Take 1 tablet (5 mg total) by mouth daily. 01/01/24   Rilla Baller, MD  pilocarpine  [facility]) 1 % ophthalmic solution Place 1 drop into the left eye 2 (two) times daily.  05/04/12   [provider]  Probiotic Product (PROBIOTIC DAILY PO) Take by mouth.    [provider]    Allergies: Augmentin [amoxicillin-pot clavulanate]    Review of Systems  Updated Vital Signs BP (!) 150/69 (BP Location: Right Arm)   Pulse 89   Temp 98.9 F (37.2 C) (Oral)   Resp 16   SpO2 98%   Physical Exam Vitals and nursing note reviewed.  Constitutional:      General: He is not in acute distress.    Appearance: He is  well-developed. He is not ill-appearing.  HENT:     Head: Normocephalic and atraumatic.     Mouth/Throat:     Mouth: Mucous membranes are moist.  Eyes:     Extraocular Movements: Extraocular movements intact.     Conjunctiva/sclera: Conjunctivae normal.     Pupils: Pupils are equal, round, and reactive to light.  Cardiovascular:     Rate and Rhythm: Normal rate and regular rhythm.     Heart sounds: Normal heart sounds. No murmur heard. Pulmonary:     Effort: Pulmonary effort is normal. No respiratory distress.     Breath sounds: Normal breath sounds.  Abdominal:     Palpations: Abdomen is soft.     Tenderness: There is abdominal tenderness in the left upper quadrant and left lower quadrant.  Musculoskeletal:        General: No swelling.     Cervical back: Neck supple.  Skin:    General: Skin is warm and dry.     Capillary Refill: Capillary refill takes less than 2 seconds.  Neurological:     Mental Status: He is alert.  Psychiatric:        Mood and Affect: Mood normal.     (all labs ordered are listed, but only abnormal results are displayed) Labs Reviewed  CBC WITH DIFFERENTIAL/PLATELET - Abnormal; Notable for the following components:  Result Value   Neutro Abs 8.5 (*)    All other components within normal limits  COMPREHENSIVE METABOLIC PANEL WITH GFR - Abnormal; Notable for the following components:   CO2 20 (*)    Glucose, Bld 115 (*)    Creatinine, Ser 1.30 (*)    Total Bilirubin 2.3 (*)    All other components within normal limits  URINALYSIS, ROUTINE W REFLEX MICROSCOPIC - Abnormal; Notable for the following components:   Color, Urine COLORLESS (*)    Specific Gravity, Urine <1.005 (*)    Ketones, ur 15 (*)    All other components within normal limits  LIPASE, BLOOD  TROPONIN T, HIGH SENSITIVITY    EKG: None  Radiology: CT Angio Chest/Abd/Pel for Dissection W and/or Wo Contrast Result Date: 03/05/2024 CLINICAL DATA:  Acute aortic syndrome  suspected, left upper quadrant pain since yesterday EXAM: CT ANGIOGRAPHY CHEST, ABDOMEN AND PELVIS TECHNIQUE: Non-contrast CT of the chest was initially obtained. Multidetector CT imaging through the chest, abdomen and pelvis was performed using the standard protocol during bolus administration of intravenous contrast. Multiplanar reconstructed images and MIPs were obtained and reviewed to evaluate the vascular anatomy. RADIATION DOSE REDUCTION: This exam was performed according to the departmental dose-optimization program which includes automated exposure control, adjustment of the mA and/or kV according to patient size and/or use of iterative reconstruction technique. CONTRAST:  OMNIPAQUE  IOHEXOL  350 MG/ML SOLN COMPARISON:  None Available. FINDINGS: CTA CHEST FINDINGS VASCULAR Aorta: Satisfactory opacification of the aorta. Normal contour and caliber of the thoracic aorta. No evidence of aneurysm, dissection, or other acute aortic pathology. Cardiovascular: No evidence of pulmonary embolism on limited non-tailored examination. Normal heart size. No pericardial effusion. Review of the MIP images confirms the above findings. NON VASCULAR Mediastinum/Nodes: No enlarged mediastinal, hilar, or axillary lymph nodes. Thyroid  gland, trachea, and esophagus demonstrate no significant findings. Lungs/Pleura: Lungs are clear. No pleural effusion or pneumothorax. Musculoskeletal: Bandlike scarring of the bilateral lung bases. No acute osseous findings. Review of the MIP images confirms the above findings. CTA ABDOMEN AND PELVIS FINDINGS VASCULAR Normal contour and caliber of the abdominal aorta. No evidence of aneurysm, dissection, or other acute aortic pathology. 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 Mild aortic atherosclerosis. Review of the MIP images confirms the above findings. NON-VASCULAR Hepatobiliary: No solid liver  abnormality is seen. Hepatic steatosis. No gallstones, gallbladder wall thickening, or biliary dilatation. Pancreas: Unremarkable. No pancreatic ductal dilatation or surrounding inflammatory changes. Spleen: Normal in size without significant abnormality. Adrenals/Urinary Tract: Adrenal glands are unremarkable. There is a 0.7 cm calculus in the proximal third of the left ureter with mild associated hydronephrosis and a delayed left nephrogram (series five, image 1 85, series 10, image 85). Bladder is unremarkable. Stomach/Bowel: Stomach is within normal limits. Appendix appears normal. No evidence of bowel wall thickening, distention, or inflammatory changes. Lymphatic: No enlarged abdominal or pelvic lymph nodes. Reproductive: No mass or other significant abnormality. Other: No abdominal wall hernia or abnormality. No ascites. Musculoskeletal: No acute osseous findings. Right hip total arthroplasty. IMPRESSION: 1. Normal contour and caliber of the thoracic and abdominal aorta. No evidence of aneurysm, dissection, or other acute aortic pathology. Mild aortic atherosclerosis. 2. There is a 0.7 cm calculus in the proximal third of the left ureter with mild associated hydronephrosis and a delayed left nephrogram. 3. Hepatic steatosis. Aortic Atherosclerosis (ICD10-I70.0). Electronically Signed   By: Marolyn JONETTA Marlyce CHRISTELLA.D.  On: 03/05/2024 16:56     Procedures   Medications Ordered in the ED  fentaNYL  (SUBLIMAZE ) injection 50 mcg (50 mcg Intravenous Given 03/05/24 1537)  HYDROmorphone  (DILAUDID ) injection 1 mg (1 mg Intravenous Given 03/05/24 1604)  ondansetron  (ZOFRAN ) injection 4 mg (4 mg Intravenous Given 03/05/24 1603)  sodium chloride  0.9 % bolus 1,000 mL (0 mLs Intravenous Stopped 03/05/24 1659)  iohexol  (OMNIPAQUE ) 350 MG/ML injection 100 mL (100 mLs Intravenous Contrast Given 03/05/24 1624)  HYDROmorphone  (DILAUDID ) injection 1 mg (1 mg Intravenous Given 03/05/24 1641)  ketorolac  (TORADOL ) 15 MG/ML injection  15 mg (15 mg Intravenous Given 03/05/24 1706)                                    Medical Decision Making Amount and/or Complexity of Data Reviewed Labs: ordered. Radiology: ordered.  Risk Prescription drug management.   Keeon Zurn is here with abdominal pain.  History of hereditary hematocrit with cytosis.  Denies any weakness numbness tingling or chest pain.  Unremarkable vitals.  Tender in the left side of his abdomen.  Differential diagnosis is wide could be kidney stone versus colitis versus diverticulitis versus less likely dissection, ACS pancreatitis gastritis cholecystitis.  Will get CBC CMP lipase urinalysis troponin EKG CT dissection study will give fentanyl  and reevaluate.  EKG shows sinus rhythm.  No ischemic changes.  CT dissection study shows left-sided kidney stone 0.7 cm.  Overall no urinary tract infection no leukocytosis no fever.  Troponin and other labs are unremarkable.  Pain much better after several doses of IV narcotics and IV Toradol .  Will prescribe oxycodone  and Flomax  Zofran .  He understands return precautions including worsening pain nausea fever as well as given information to follow-up with urology.  Discharged in good condition.  Understands return precautions.  This chart was dictated using voice recognition software.  Despite best efforts to proofread,  errors can occur which can change the documentation meaning.        Final diagnoses:  Kidney stone    ED Discharge Orders          Ordered    oxyCODONE  (ROXICODONE ) 5 MG immediate release tablet  Every 4 hours PRN        03/05/24 1738    tamsulosin  (FLOMAX ) 0.4 MG CAPS capsule  Daily        03/05/24 1738    ondansetron  (ZOFRAN -ODT) 4 MG disintegrating tablet  Every 8 hours PRN        03/05/24 1738               Ruthe Cornet, DO 03/05/24 1739

## 2024-03-05 NOTE — ED Triage Notes (Signed)
 C/o LUQ pain since yesterday. States pain started in back and has traveled to front. Denies n/v/d.

## 2024-03-05 NOTE — Discharge Instructions (Addendum)
 If you develop a fever greater than 100.4 or uncontrollable nausea and pain please return to [facility] for reevaluation.  Continue to strain your urine to see if you passed a kidney stone.  I have prescribed you Flomax  to help move the stone.  I prescribed you Zofran  for nausea.  I prescribed you narcotic pain medicine called Roxicodone  for breakthrough pain.  This medication is sedating so please be careful with its use do not mix alcohol or drugs or dangerous activities including driving.  I recommend 400 mg ibuprofen every 8 hours as needed for pain.  I recommend 1000 mg of Tylenol  every 6 hours as needed for pain.

## 2024-03-05 NOTE — ED Notes (Signed)
 DC paperwork given and verbally understood.

## 2024-03-05 NOTE — ED Notes (Signed)
 Pt aware of the need for a urine... Pt unable to currently provide a sample.SABRASABRA

## 2024-03-06 ENCOUNTER — Inpatient Hospital Stay

## 2024-03-12 DIAGNOSIS — N132 Hydronephrosis with renal and ureteral calculous obstruction: Secondary | ICD-10-CM | POA: Diagnosis not present

## 2024-03-12 DIAGNOSIS — N201 Calculus of ureter: Secondary | ICD-10-CM | POA: Diagnosis not present

## 2024-03-13 ENCOUNTER — Encounter (HOSPITAL_COMMUNITY): Payer: Self-pay | Admitting: Urology

## 2024-03-13 ENCOUNTER — Other Ambulatory Visit: Payer: Self-pay | Admitting: Urology

## 2024-03-13 ENCOUNTER — Ambulatory Visit: Payer: Medicare PPO | Admitting: Dermatology

## 2024-03-13 ENCOUNTER — Inpatient Hospital Stay

## 2024-03-13 DIAGNOSIS — D229 Melanocytic nevi, unspecified: Secondary | ICD-10-CM

## 2024-03-13 DIAGNOSIS — L817 Pigmented purpuric dermatosis: Secondary | ICD-10-CM | POA: Diagnosis not present

## 2024-03-13 DIAGNOSIS — Z1283 Encounter for screening for malignant neoplasm of skin: Secondary | ICD-10-CM

## 2024-03-13 DIAGNOSIS — L570 Actinic keratosis: Secondary | ICD-10-CM | POA: Diagnosis not present

## 2024-03-13 DIAGNOSIS — Z7189 Other specified counseling: Secondary | ICD-10-CM | POA: Diagnosis not present

## 2024-03-13 DIAGNOSIS — L578 Other skin changes due to chronic exposure to nonionizing radiation: Secondary | ICD-10-CM | POA: Diagnosis not present

## 2024-03-13 DIAGNOSIS — W908XXA Exposure to other nonionizing radiation, initial encounter: Secondary | ICD-10-CM | POA: Diagnosis not present

## 2024-03-13 DIAGNOSIS — L820 Inflamed seborrheic keratosis: Secondary | ICD-10-CM

## 2024-03-13 DIAGNOSIS — L821 Other seborrheic keratosis: Secondary | ICD-10-CM | POA: Diagnosis not present

## 2024-03-13 DIAGNOSIS — L814 Other melanin hyperpigmentation: Secondary | ICD-10-CM

## 2024-03-13 NOTE — Progress Notes (Unsigned)
 New Patient Visit   Subjective  Michael Vaughan is a 58 y.o. male who presents for the following: Skin Cancer Screening and Full Body Skin Exam Patient reports he has some spots on right wrist and face he would like checked;  Patient denies family or personal history of skin cancer  The patient presents for Total-Body Skin Exam (TBSE) for skin cancer screening and mole check. The patient has spots, moles and lesions to be evaluated, some may be new or changing and the patient may have concern these could be cancer.  The following portions of the chart were reviewed this encounter and updated as appropriate: medications, allergies, medical history  Review of Systems:  No other skin or systemic complaints except as noted in HPI or Assessment and Plan.  Objective  Well appearing patient in no apparent distress; mood and affect are within normal limits.  A full examination was performed including scalp, head, eyes, ears, nose, lips, neck, chest, axillae, abdomen, back, buttocks, bilateral upper extremities, bilateral lower extremities, hands, feet, fingers, toes, fingernails, and toenails. All findings within normal limits unless otherwise noted below.   Relevant physical exam findings are noted in the Assessment and Plan.  left ear x 1, left temple x 2 (3) Erythematous thin papules/macules with gritty scale.  b/l arms x 6, right lateral pretibial x 1 (7) Erythematous stuck-on, waxy papule or plaque  Assessment & Plan   SKIN CANCER SCREENING PERFORMED TODAY.  ACTINIC DAMAGE - Chronic condition, secondary to cumulative UV/sun exposure - diffuse scaly erythematous macules with underlying dyspigmentation - Recommend daily broad spectrum sunscreen SPF 30+ to sun-exposed areas, reapply every 2 hours as needed.  - Staying in the shade or wearing long sleeves, sun glasses (UVA+UVB protection) and wide brim hats (4-inch brim around the entire circumference of the hat) are also recommended for  sun protection.  - Call for new or changing lesions.  LENTIGINES, SEBORRHEIC KERATOSES, HEMANGIOMAS - Benign normal skin lesions - Benign-appearing - Call for any changes  MELANOCYTIC NEVI - Tan-brown and/or pink-flesh-colored symmetric macules and papules - Benign appearing on exam today - Observation - Call clinic for new or changing moles - Recommend daily use of broad spectrum spf 30+ sunscreen to sun-exposed areas.   SCHAMBERG'S PIGMENTED PURPURA At lower legs  Exam: cayenne-pepper-like macules with associated golden-brown pigmentation of lower legs.  +/- lower leg edema Schamberg's purpura is a benign chronic condition that may also have intermittent episodes of worsening/improvement.  It is the most common type of pigmented purpura and is typically asymptomatic.  It generally affects older individuals, and may be related to leg swelling, increased activity, or excessive alcohol use.  Sometimes short term treatment with a mid-potency topical steroid is given for acute flares. Treatment Plan: Benign, observe.   Recommend daily graduated compression hose/stockings- easiest to put on first thing in morning, remove at bedtime.  ACTINIC KERATOSIS (3) left ear x 1, left temple x 2 (3) Patient has other aks at the face and ears , will treat other areas at next follow up in November, patient deferred treatment today due to wedding    Actinic keratoses are precancerous spots that appear secondary to cumulative UV radiation exposure/sun exposure over time. They are chronic with expected duration over 1 year. A portion of actinic keratoses will progress to squamous cell carcinoma of the skin. It is not possible to reliably predict which spots will progress to skin cancer and so treatment is recommended to prevent development of skin cancer.  Recommend daily broad spectrum sunscreen SPF 30+ to sun-exposed areas, reapply every 2 hours as needed.  Recommend staying in the shade or wearing long  sleeves, sun glasses (UVA+UVB protection) and wide brim hats (4-inch brim around the entire circumference of the hat). Call for new or changing lesions. Destruction of lesion - left ear x 1, left temple x 2 (3) Complexity: simple   Destruction method: cryotherapy   Informed consent: discussed and consent obtained   Timeout:  patient name, date of birth, surgical site, and procedure verified Lesion destroyed using liquid nitrogen: Yes   Region frozen until ice ball extended beyond lesion: Yes   Outcome: patient tolerated procedure well with no complications   Post-procedure details: wound care instructions given    INFLAMED SEBORRHEIC KERATOSIS (7) b/l arms x 6, right lateral pretibial x 1 (7) Symptomatic, irritating, patient would like treated.  Patient has other isks at face, would like defer treatment due to wedding , will treat at follow up Destruction of lesion - b/l arms x 6, right lateral pretibial x 1 (7) Complexity: simple   Destruction method: cryotherapy   Informed consent: discussed and consent obtained   Timeout:  patient name, date of birth, surgical site, and procedure verified Lesion destroyed using liquid nitrogen: Yes   Region frozen until ice ball extended beyond lesion: Yes   Outcome: patient tolerated procedure well with no complications   Post-procedure details: wound care instructions given     Return in about 1 year (around 03/13/2025) for TBSE.  IEleanor Blush, CMA, am acting as scribe for Alm Rhyme, MD.   Documentation: I have reviewed the above documentation for accuracy and completeness, and I agree with the above.  Alm Rhyme, MD

## 2024-03-13 NOTE — Progress Notes (Signed)
 Spoke w/ via phone for pre-op interview---Patient Lab needs dos---KUB-         Lab results------ COVID test ---Not indicated--patient states asymptomatic no test needed Arrive at ---0600 NPO after MN NO Solid Food.  Clear liquids from MN until--- Pre-Surgery Ensure or G2:  Med rec completed Medications to take morning of surgery ----- Diabetic medication -----  GLP1 agonist last dose: GLP1 instructions:  Patient instructed no nail polish to be worn day of surgery Patient instructed to bring photo id and insurance card day of surgery Patient aware to have Driver (ride ) / caregiver    for 24 hours after surgery - spouse- Mliss [phone] Patient Special Instructions ---Bring blue folder, On Thursday take laxative of choice, hydrate well, eat light meal that evening, do not wear metal from waist down (including zippers, buckles, buttons, snaps, grommets), do not wear flip flops or sandals Pre-Op special Instructions ---per Norita Dustman and Litho  Patient verbalized understanding of instructions that were given at this phone interview. Patient denies chest pain, sob, fever, cough at the interview.

## 2024-03-13 NOTE — Patient Instructions (Addendum)
 Seborrheic Keratosis  What causes seborrheic keratoses? Seborrheic keratoses are harmless, common skin growths that first appear during adult life.  As time goes by, more growths appear.  Some people may develop a large number of them.  Seborrheic keratoses appear on both covered and uncovered body parts.  They are not caused by sunlight.  The tendency to develop seborrheic keratoses can be inherited.  They vary in color from skin-colored to gray, brown, or even black.  They can be either smooth or have a rough, warty surface.   Seborrheic keratoses are superficial and look as if they were stuck on the skin.  Under the microscope this type of keratosis looks like layers upon layers of skin.  That is why at times the top layer may seem to fall off, but the rest of the growth remains and re-grows.    Treatment Seborrheic keratoses do not need to be treated, but can easily be removed in the office.  Seborrheic keratoses often cause symptoms when they rub on clothing or jewelry.  Lesions can be in the way of shaving.  If they become inflamed, they can cause itching, soreness, or burning.  Removal of a seborrheic keratosis can be accomplished by freezing, burning, or surgery. If any spot bleeds, scabs, or grows rapidly, please return to have it checked, as these can be an indication of a skin cancer.   Cryotherapy Aftercare  Wash gently with soap and water everyday.   Apply Vaseline and Band-Aid daily until healed.    Actinic keratoses are precancerous spots that appear secondary to cumulative UV radiation exposure/sun exposure over time. They are chronic with expected duration over 1 year. A portion of actinic keratoses will progress to squamous cell carcinoma of the skin. It is not possible to reliably predict which spots will progress to skin cancer and so treatment is recommended to prevent development of skin cancer.  Recommend daily broad spectrum sunscreen SPF 30+ to sun-exposed areas, reapply  every 2 hours as needed.  Recommend staying in the shade or wearing long sleeves, sun glasses (UVA+UVB protection) and wide brim hats (4-inch brim around the entire circumference of the hat). Call for new or changing lesions.     Melanoma ABCDEs  Melanoma is the most dangerous type of skin cancer, and is the leading cause of death from skin disease.  You are more likely to develop melanoma if you: Have light-colored skin, light-colored eyes, or red or blond hair Spend a lot of time in the sun Tan regularly, either outdoors or in a tanning bed Have had blistering sunburns, especially during childhood Have a close family member who has had a melanoma Have atypical moles or large birthmarks  Early detection of melanoma is key since treatment is typically straightforward and cure rates are extremely high if we catch it early.   The first sign of melanoma is often a change in a mole or a new dark spot.  The ABCDE system is a way of remembering the signs of melanoma.  A for asymmetry:  The two halves do not match. B for border:  The edges of the growth are irregular. C for color:  A mixture of colors are present instead of an even brown color. D for diameter:  Melanomas are usually (but not always) greater than 6mm - the size of a pencil eraser. E for evolution:  The spot keeps changing in size, shape, and color.  Please check your skin once per month between visits. You can  use a small mirror in front and a large mirror behind you to keep an eye on the back side or your body.   If you see any new or changing lesions before your next follow-up, please call to schedule a visit.  Please continue daily skin protection including broad spectrum sunscreen SPF 30+ to sun-exposed areas, reapplying every 2 hours as needed when you're outdoors.   Staying in the shade or wearing long sleeves, sun glasses (UVA+UVB protection) and wide brim hats (4-inch brim around the entire circumference of the hat)  are also recommended for sun protection.    Due to recent changes in healthcare laws, you may see results of your pathology and/or laboratory studies on MyChart before the doctors have had a chance to review them. We understand that in some cases there may be results that are confusing or concerning to you. Please understand that not all results are received at the same time and often the doctors may need to interpret multiple results in order to provide you with the best plan of care or course of treatment. Therefore, we ask that you please give us  2 business days to thoroughly review all your results before contacting the office for clarification. Should we see a critical lab result, you will be contacted sooner.   If You Need Anything After Your Visit  If you have any questions or concerns for your doctor, please call our main line at [phone] and press option 4 to reach your doctor's medical assistant. If no one answers, please leave a voicemail as directed and we will return your call as soon as possible. Messages left after 4 pm will be answered the following business day.   You may also send us  a message via MyChart. We typically respond to MyChart messages within 1-2 business days.  For prescription refills, please ask your pharmacy to contact our office. Our fax number is [phone].  If you have an urgent issue when the clinic is closed that cannot wait until the next business day, you can page your doctor at the number below.    Please note that while we do our best to be available for urgent issues outside of office hours, we are not available 24/7.   If you have an urgent issue and are unable to reach us , you may choose to seek medical care at your doctor's office, retail clinic, urgent care center, or emergency room.  If you have a medical emergency, please immediately call 911 or go to the emergency department.  Pager Numbers  - Dr. Hester: [phone]  - Dr. Jackquline:  [phone]  - Dr. Claudene: [phone]   - Dr. Raymund: [phone]  In the event of inclement weather, please call our main line at [phone] for an update on the status of any delays or closures.  Dermatology Medication Tips: Please keep the boxes that topical medications come in in order to help keep track of the instructions about where and how to use these. Pharmacies typically print the medication instructions only on the boxes and not directly on the medication tubes.   If your medication is too expensive, please contact our office at [phone] option 4 or send us  a message through MyChart.   We are unable to tell what your co-pay for medications will be in advance as this is different depending on your insurance coverage. However, we may be able to find a substitute medication at lower cost or fill out paperwork to get insurance to cover  a needed medication.   If a prior authorization is required to get your medication covered by your insurance company, please allow us  1-2 business days to complete this process.  Drug prices often vary depending on where the prescription is filled and some pharmacies may offer cheaper prices.  The website www.goodrx.com contains coupons for medications through different pharmacies. The prices here do not account for what the cost may be with help from insurance (it may be cheaper with your insurance), but the website can give you the price if you did not use any insurance.  - You can print the associated coupon and take it with your prescription to the pharmacy.  - You may also stop by our office during regular business hours and pick up a GoodRx coupon card.  - If you need your prescription sent electronically to a different pharmacy, notify our office through [facility] or by phone at [phone] option 4.     Si Usted Necesita Algo Despus de Su Visita  Tambin puede enviarnos un mensaje a travs de Clinical cytogeneticist. Por lo general  respondemos a los mensajes de MyChart en el transcurso de 1 a 2 das hbiles.  Para renovar recetas, por favor pida a su farmacia que se ponga en contacto con nuestra oficina. Randi lakes de fax es [place] [phone].  Si tiene un asunto urgente cuando la clnica est cerrada y que no puede esperar hasta el siguiente da hbil, puede llamar/localizar a su doctor(a) al nmero que aparece a continuacin.   Por favor, tenga en cuenta que aunque hacemos todo lo posible para estar disponibles para asuntos urgentes fuera del horario de [place], no estamos disponibles las 24 horas del da, los 7 [address]  [address] de la [place].   Si tiene un problema urgente y no puede comunicarse con nosotros, puede optar por buscar atencin mdica  en el consultorio de su doctor(a), en una clnica privada, en un centro de atencin urgente o en una sala de emergencias.  Si tiene Engineer, drilling, por favor llame inmediatamente al 911 o vaya a la sala de emergencias.  Nmeros de bper  - Dr. Hester: [phone]  - Dra. Jackquline: [phone]  - Dr. Claudene: [phone]  - Dra. Kitts: [phone]  En caso de inclemencias del [place], por favor llame a nuestra lnea principal al [phone] para una actualizacin sobre el estado de cualquier retraso o cierre.  Consejos para la medicacin en dermatologa: Por favor, guarde las cajas en las que vienen los medicamentos de uso tpico para ayudarle a seguir las instrucciones sobre dnde y cmo usarlos. Las farmacias generalmente imprimen las instrucciones del medicamento slo en las cajas y no directamente en los tubos del [place].   Si su medicamento es muy caro, por favor, pngase en contacto con landry rieger llamando al [phone] y presione la opcin 4 o envenos un mensaje a travs de Clinical cytogeneticist.   No podemos decirle cul ser su copago por los medicamentos por adelantado ya que esto es diferente dependiendo de la cobertura de su seguro. Sin embargo, es posible  que podamos encontrar un medicamento sustituto a Audiological scientist un formulario para que el seguro cubra el medicamento que se considera necesario.   Si se requiere una autorizacin previa para que su compaa de seguros malta su medicamento, por favor permtanos de 1 a 2 das hbiles para completar este proceso.  Los precios de los medicamentos varan con frecuencia dependiendo del Environmental consultant de dnde se surte la receta y iraq  pueden ofrecer precios ms baratos.  El sitio web www.goodrx.com tiene cupones para medicamentos de Health and safety inspector. Los precios aqu no tienen en cuenta lo que podra costar con la ayuda del seguro (puede ser ms barato con su seguro), pero el sitio web puede darle el precio si no utiliz Tourist information centre manager.  - Puede imprimir el cupn correspondiente y llevarlo con su receta a la farmacia.  - Tambin puede pasar por nuestra oficina durante el horario de atencin regular y Education officer, museum una tarjeta de cupones de GoodRx.  - Si necesita que su receta se enve electrnicamente a una farmacia diferente, informe a nuestra oficina a travs de MyChart de [place] o por telfono llamando al [phone] y presione la opcin 4.

## 2024-03-14 ENCOUNTER — Encounter: Payer: Self-pay | Admitting: Dermatology

## 2024-03-14 NOTE — H&P (Signed)
 Michael Vaughan is a 58 year old male presenting today as a hospital follow-up. He presented to the ED on 03/05/2024 and was found to have a left 7 mm proximal ureteral stone with mild associated hydronephrosis. He was discharged with prescriptions for oxycodone , Zofran , Flomax . He has not passed his stone as of yet. He continues with intermittent flank pain that radiates into his lower abdomen. The pain has been manageable so far with ibuprofen and oxycodone , but occasionally has severe pain that lasts about 30 minutes. He denies fever or chills, difficulty voiding, gross hematuria. Denies nausea or vomiting.     ALLERGIES: Augmentin    MEDICATIONS: Tamsulosin  HCl 0.4 MG Capsule  amLODIPine  Besylate  oxyCODONE  HCl     GU PSH: No GU PSH    NON-GU PSH: No Non-GU PSH    GU PMH: No GU PMH    NON-GU PMH: No Non-GU PMH    FAMILY HISTORY: No Family History    SOCIAL HISTORY: Marital Status: Married Preferred Language: English; Ethnicity: Not Hispanic Or Latino; Race: White Current Smoking Status: Patient does not smoke anymore. Has not smoked since 02/12/2004.   Tobacco Use Assessment Completed: Used Tobacco in last 30 days? Does not use smokeless tobacco. Does not use drugs. Drinks 2 caffeinated drinks per day. Has not had a blood transfusion.    REVIEW OF SYSTEMS:    GU Review Male:   Patient reports get up at night to urinate. Patient denies have to strain to urinate , penile pain, leakage of urine, erection problems, frequent urination, trouble starting your stream, burning/ pain with urination, hard to postpone urination, and stream starts and stops.  Gastrointestinal (Upper):   Patient denies nausea, vomiting, and indigestion/ heartburn.  Gastrointestinal (Lower):   Patient reports constipation. Patient denies diarrhea.  Constitutional:   Patient reports night sweats. Patient denies fever, weight loss, and fatigue.  Skin:   Patient denies skin rash/ lesion and itching.  Eyes:   Patient  denies blurred vision and double vision.  Ears/ Nose/ Throat:   Patient denies sore throat and sinus problems.  Hematologic/Lymphatic:   Patient denies swollen glands and easy bruising.  Cardiovascular:   Patient reports leg swelling. Patient denies chest pains.  Respiratory:   Patient denies cough and shortness of breath.  Endocrine:   Patient denies excessive thirst.  Musculoskeletal:   Patient reports back pain and joint pain.   Neurological:   Patient denies headaches and dizziness.  Psychologic:   Patient denies depression and anxiety.   VITAL SIGNS:      03/12/2024 09:25 AM  BP 153/89 mmHg  Pulse 78 /min  Temperature 97.5 F / 36.3 C   MULTI-SYSTEM PHYSICAL EXAMINATION:    Constitutional: Well-nourished. No physical deformities. Normally developed. Good grooming.  Neck: Neck symmetrical, not swollen. Normal tracheal position.  Respiratory: No labored breathing, no use of accessory muscles.   Neurologic / Psychiatric: Oriented to time, oriented to place, oriented to person. No depression, no anxiety, no agitation.  Gastrointestinal: No mass, no tenderness, no rigidity, non obese abdomen. No CVA tenderness.  Musculoskeletal: Normal gait and station of head and neck.     Complexity of Data:  Source Of History:  Patient, Family/Caregiver, Medical Record Summary  Records Review:   Previous Doctor Records, Previous Hospital Records, Previous Patient Records  Urine Test Review:   Urinalysis  X-Ray Review: KUB: Reviewed Films. Reviewed Report. Discussed With Patient.  C.T. Abdomen/Pelvis: Reviewed Films. Reviewed Report. Discussed With Patient.     03/12/24  Urinalysis  Urine Appearance Clear   Urine Color Amber   Urine Glucose Neg mg/dL  Urine Bilirubin Neg mg/dL  Urine Ketones Neg mg/dL  Urine Specific Gravity 1.030   Urine Blood Neg ery/uL  Urine pH <=5.0   Urine Protein Neg mg/dL  Urine Urobilinogen 0.2 mg/dL  Urine Nitrites Neg   Urine Leukocyte Esterase Neg leu/uL    PROCEDURES:         KUB - [id number]  A single view of the abdomen is obtained. Bilateral renal shadows visualized without any opacities. There is a 7 mm opacity noted within the expected left proximal ureter. No additional calcifications noted.      . Patient confirmed No Neulasta OnPro Device.          Visit Complexity - G2211          Urinalysis Dipstick Dipstick Cont'd  Color: Amber Bilirubin: Neg mg/dL  Appearance: Clear Ketones: Neg mg/dL  Specific Gravity: 8.969 Blood: Neg ery/uL  pH: <=5.0 Protein: Neg mg/dL  Glucose: Neg mg/dL Urobilinogen: 0.2 mg/dL    Nitrites: Neg    Leukocyte Esterase: Neg leu/uL    ASSESSMENT:      ICD-10 Details  1 GU:   Ureteral calculus - N20.1 Acute, Uncomplicated  2   Ureteral obstruction secondary to calculous - N13.2 Undiagnosed New Problem   PLAN:            Medications New Meds: Ketorolac  Tromethamine  10 MG Tablet 1 tablet PO Q 6 H PRN   #20  0 Refill(s)  Tamsulosin  HCl 0.4 MG Capsule 1 capsule PO Daily   #30  0 Refill(s)  oxyCODONE  HCl 5 MG Capsule 1 capsule PO Q 6 H PRN for severe pain   #10  0 Refill(s)  Pharmacy Name:  [facility]  Address:  [address]   [place], [place] [address]  Phone:  [phone]  Fax:  [phone]    Stop Meds: oxyCODONE  HCl 5 MG Capsule 1 capsule PO Q 6 H PRN  Start: 03/12/2024  Discontinue: 03/12/2024  - Reason: The medication was entered incorrectly.            Orders X-Rays: KUB          Schedule Return Visit/Planned Activity: Next Available Appointment - Schedule Surgery          Document Letter(s):  Created for Patient: Clinical Summary         Notes:   KUB with a 7 mm proximal left ureteral stone. We discussed management options including observation with medication expulsion, ESWL, ureteroscopy. Risks and benefits discussed of all. He opts for ESWL. Refills sent for tamsulosin . I sent in ketorolac  and oxycodone  to take for severe pain. He understands not to take NSAIDs 48  hours prior to his procedure. Return precautions discussed.        Next Appointment:      Next Appointment: 03/15/2024 07:30 AM    Appointment Type: Surgery     Location: [facility] [phone]    Provider: Norleen Seltzer, [name]    Reason for Visit: WL/OP-LT ESWL

## 2024-03-15 ENCOUNTER — Other Ambulatory Visit: Payer: Self-pay

## 2024-03-15 ENCOUNTER — Encounter (HOSPITAL_COMMUNITY)
Admission: RE | Disposition: A | Discharge status: Home / Self Care | Payer: Self-pay | Source: Home / Self Care | Attending: Urology

## 2024-03-15 ENCOUNTER — Ambulatory Visit (HOSPITAL_COMMUNITY)
Admission: RE | Admit: 2024-03-15 | Discharge: 2024-03-15 | Disposition: A | Discharge status: Home / Self Care | Attending: Urology | Admitting: Urology

## 2024-03-15 ENCOUNTER — Encounter (HOSPITAL_COMMUNITY): Payer: Self-pay | Admitting: Urology

## 2024-03-15 DIAGNOSIS — N132 Hydronephrosis with renal and ureteral calculous obstruction: Secondary | ICD-10-CM | POA: Diagnosis not present

## 2024-03-15 DIAGNOSIS — Z87891 Personal history of nicotine dependence: Secondary | ICD-10-CM | POA: Diagnosis not present

## 2024-03-15 DIAGNOSIS — I10 Essential (primary) hypertension: Secondary | ICD-10-CM | POA: Diagnosis not present

## 2024-03-15 DIAGNOSIS — N201 Calculus of ureter: Secondary | ICD-10-CM | POA: Diagnosis not present

## 2024-03-15 HISTORY — PX: EXTRACORPOREAL SHOCK WAVE LITHOTRIPSY: SHX1557

## 2024-03-15 SURGERY — LITHOTRIPSY, ESWL
Anesthesia: LOCAL | Laterality: Left

## 2024-03-15 MED ORDER — DIPHENHYDRAMINE HCL 25 MG PO CAPS: 25.0000 mg | ORAL_CAPSULE | ORAL | Status: DC

## 2024-03-15 MED ORDER — SODIUM CHLORIDE 0.9 % IV SOLN
INTRAVENOUS | Status: DC
Start: 2024-03-15 — End: 2024-03-15

## 2024-03-15 MED ORDER — CIPROFLOXACIN HCL 500 MG PO TABS
500.0000 mg | ORAL_TABLET | ORAL | Status: AC
  Administered 2024-03-15: 500 mg via ORAL
  Filled 2024-03-15: qty 1

## 2024-03-15 MED ORDER — SODIUM CHLORIDE 0.9% FLUSH: 3.0000 mL | Freq: Two times a day (BID) | INTRAVENOUS | Status: DC

## 2024-03-15 MED ORDER — DIAZEPAM 5 MG PO TABS: 10.0000 mg | ORAL_TABLET | ORAL | Status: DC

## 2024-03-15 NOTE — Op Note (Signed)
 See PSC scanned note

## 2024-03-15 NOTE — Interval H&P Note (Signed)
 History and Physical Interval Note:  Having pain this morning.     03/15/2024 7:42 AM  Ubaldo Husband  has presented today for surgery, with the diagnosis of LEFT URETERAL STONE.  The various methods of treatment have been discussed with the patient and family. After consideration of risks, benefits and other options for treatment, the patient has consented to  Procedure(s): LITHOTRIPSY, ESWL (Left) as a surgical intervention.  The patient's history has been reviewed, patient examined, no change in status, stable for surgery.  I have reviewed the patient's chart and labs.  Questions were answered to the patient's satisfaction.     Atisha Hamidi

## 2024-03-18 ENCOUNTER — Encounter (HOSPITAL_COMMUNITY): Payer: Self-pay | Admitting: Urology

## 2024-03-18 DIAGNOSIS — N201 Calculus of ureter: Secondary | ICD-10-CM | POA: Insufficient documentation

## 2024-03-20 ENCOUNTER — Inpatient Hospital Stay

## 2024-03-25 ENCOUNTER — Other Ambulatory Visit

## 2024-03-27 ENCOUNTER — Inpatient Hospital Stay

## 2024-03-29 DIAGNOSIS — R399 Unspecified symptoms and signs involving the genitourinary system: Secondary | ICD-10-CM | POA: Diagnosis not present

## 2024-04-01 DIAGNOSIS — N201 Calculus of ureter: Secondary | ICD-10-CM | POA: Diagnosis not present

## 2024-04-03 ENCOUNTER — Inpatient Hospital Stay

## 2024-04-10 ENCOUNTER — Inpatient Hospital Stay

## 2024-04-15 DIAGNOSIS — N201 Calculus of ureter: Secondary | ICD-10-CM | POA: Diagnosis not present

## 2024-04-15 DIAGNOSIS — R399 Unspecified symptoms and signs involving the genitourinary system: Secondary | ICD-10-CM | POA: Diagnosis not present

## 2024-04-17 ENCOUNTER — Inpatient Hospital Stay

## 2024-04-17 DIAGNOSIS — Z96641 Presence of right artificial hip joint: Secondary | ICD-10-CM | POA: Diagnosis not present

## 2024-04-17 DIAGNOSIS — M1612 Unilateral primary osteoarthritis, left hip: Secondary | ICD-10-CM | POA: Diagnosis not present

## 2024-04-17 DIAGNOSIS — M25561 Pain in right knee: Secondary | ICD-10-CM | POA: Diagnosis not present

## 2024-04-17 DIAGNOSIS — M25552 Pain in left hip: Secondary | ICD-10-CM | POA: Diagnosis not present

## 2024-04-22 ENCOUNTER — Ambulatory Visit: Admitting: Dermatology

## 2024-04-22 DIAGNOSIS — W908XXA Exposure to other nonionizing radiation, initial encounter: Secondary | ICD-10-CM | POA: Diagnosis not present

## 2024-04-22 DIAGNOSIS — L570 Actinic keratosis: Secondary | ICD-10-CM | POA: Diagnosis not present

## 2024-04-22 NOTE — Patient Instructions (Addendum)

## 2024-04-22 NOTE — Progress Notes (Unsigned)
   Follow-Up Visit   Subjective  Michael Vaughan is a 58 y.o. male who presents for the following: patient here to have precancers on his face treated today   The following portions of the chart were reviewed this encounter and updated as appropriate: medications, allergies, medical history  Review of Systems:  No other skin or systemic complaints except as noted in HPI or Assessment and Plan.  Objective  Well appearing patient in no apparent distress; mood and affect are within normal limits.  A focused examination was performed of the following areas: Face,arms, hands   Relevant exam findings are noted in the Assessment and Plan.  face x 19, left arm x 1 (20) Erythematous thin papules/macules with gritty scale.   Assessment & Plan   AK (ACTINIC KERATOSIS) (20) face x 19, left arm x 1 (20) ACTINIC DAMAGE - chronic, secondary to cumulative UV radiation exposure/sun exposure over time - diffuse scaly erythematous macules with underlying dyspigmentation - Recommend daily broad spectrum sunscreen SPF 30+ to sun-exposed areas, reapply every 2 hours as needed.  - Recommend staying in the shade or wearing long sleeves, sun glasses (UVA+UVB protection) and wide brim hats (4-inch brim around the entire circumference of the hat). - Call for new or changing lesions.  Destruction of lesion - face x 19, left arm x 1 (20) Complexity: simple   Destruction method: cryotherapy   Informed consent: discussed and consent obtained   Timeout:  patient name, date of birth, surgical site, and procedure verified Lesion destroyed using liquid nitrogen: Yes   Region frozen until ice ball extended beyond lesion: Yes   Outcome: patient tolerated procedure well with no complications   Post-procedure details: wound care instructions given     Return in about 8 months (around 12/20/2024) for TBSE, hx of Aks .  IFay Kirks, CMA, am acting as scribe for Alm Rhyme, MD .   Documentation: I have  reviewed the above documentation for accuracy and completeness, and I agree with the above.  Alm Rhyme, MD

## 2024-04-23 ENCOUNTER — Encounter: Payer: Self-pay | Admitting: Dermatology

## 2024-04-24 ENCOUNTER — Inpatient Hospital Stay

## 2024-04-29 DIAGNOSIS — N201 Calculus of ureter: Secondary | ICD-10-CM | POA: Diagnosis not present

## 2024-04-30 ENCOUNTER — Encounter: Payer: Self-pay | Admitting: Family Medicine

## 2024-04-30 ENCOUNTER — Ambulatory Visit: Admitting: Family Medicine

## 2024-04-30 ENCOUNTER — Ambulatory Visit (INDEPENDENT_AMBULATORY_CARE_PROVIDER_SITE_OTHER)
Admission: RE | Admit: 2024-04-30 | Discharge: 2024-04-30 | Disposition: A | Discharge status: Home / Self Care | Source: Ambulatory Visit | Attending: Family Medicine | Admitting: Family Medicine

## 2024-04-30 VITALS — BP 120/70 | HR 77 | Temp 98.4°F | Ht 69.0 in | Wt 202.5 lb

## 2024-04-30 DIAGNOSIS — H3552 Pigmentary retinal dystrophy: Secondary | ICD-10-CM

## 2024-04-30 DIAGNOSIS — Z01818 Encounter for other preprocedural examination: Secondary | ICD-10-CM

## 2024-04-30 DIAGNOSIS — I10 Essential (primary) hypertension: Secondary | ICD-10-CM

## 2024-04-30 LAB — IBC PANEL
Iron: 115 ug/dL (ref 42–165)
Saturation Ratios: 39.3 % (ref 20.0–50.0)
TIBC: 292.6 ug/dL (ref 250.0–450.0)
Transferrin: 209 mg/dL — ABNORMAL LOW (ref 212.0–360.0)

## 2024-04-30 LAB — CBC WITH DIFFERENTIAL/PLATELET
Basophils Absolute: 0.1 K/uL (ref 0.0–0.1)
Basophils Relative: 0.9 % (ref 0.0–3.0)
Eosinophils Absolute: 0.2 K/uL (ref 0.0–0.7)
Eosinophils Relative: 3.3 % (ref 0.0–5.0)
HCT: 42.6 % (ref 39.0–52.0)
Hemoglobin: 15.1 g/dL (ref 13.0–17.0)
Lymphocytes Relative: 26.7 % (ref 12.0–46.0)
Lymphs Abs: 2 K/uL (ref 0.7–4.0)
MCHC: 35.4 g/dL (ref 30.0–36.0)
MCV: 89.5 fl (ref 78.0–100.0)
Monocytes Absolute: 0.5 K/uL (ref 0.1–1.0)
Monocytes Relative: 6.9 % (ref 3.0–12.0)
Neutro Abs: 4.6 K/uL (ref 1.4–7.7)
Neutrophils Relative %: 62.2 % (ref 43.0–77.0)
Platelets: 319 K/uL (ref 150.0–400.0)
RBC: 4.76 Mil/uL (ref 4.22–5.81)
RDW: 13.5 % (ref 11.5–15.5)
WBC: 7.3 K/uL (ref 4.0–10.5)

## 2024-04-30 LAB — COMPREHENSIVE METABOLIC PANEL WITH GFR
ALT: 30 U/L (ref 0–53)
AST: 21 U/L (ref 0–37)
Albumin: 4.5 g/dL (ref 3.5–5.2)
Alkaline Phosphatase: 66 U/L (ref 39–117)
BUN: 20 mg/dL (ref 6–23)
CO2: 27 meq/L (ref 19–32)
Calcium: 9.6 mg/dL (ref 8.4–10.5)
Chloride: 106 meq/L (ref 96–112)
Creatinine, Ser: 1.14 mg/dL (ref 0.40–1.50)
GFR: 70.63 mL/min (ref >60.00)
Glucose, Bld: 93 mg/dL (ref 70–99)
Potassium: 4.2 meq/L (ref 3.5–5.1)
Sodium: 141 meq/L (ref 135–145)
Total Bilirubin: 1.6 mg/dL — ABNORMAL HIGH (ref 0.2–1.2)
Total Protein: 7 g/dL (ref 6.0–8.3)

## 2024-04-30 LAB — FERRITIN: Ferritin: 17.1 ng/mL — ABNORMAL LOW (ref 22.0–322.0)

## 2024-04-30 NOTE — Progress Notes (Unsigned)
 Ph: [phone] Fax: [phone]   Patient ID: Michael Vaughan, male    DOB: 10-27-1965, 58 y.o.   MRN: [id number]  This visit was conducted in person.  BP 120/70   Pulse 77   Temp 98.4 F (36.9 C) (Oral)   Ht 5' 9 (1.753 m)   Wt 202 lb 8 oz (91.9 kg)   SpO2 96%   BMI 29.90 kg/m    CC: preop eval  Subjective:   HPI: Michael Vaughan is a 58 y.o. male presenting on 04/30/2024 for Medical Management of Chronic Issues (Pt here for clearance for  Hip replacement )    Michael Vaughan  has a past medical history of Allergy, Arthralgia, Arthritis, CAP (community acquired pneumonia) (12/02/2023), Cataract, Heartburn, Hereditary hemochromatosis (HCC) (05/2013), History of chicken pox, History of migraine (1990s), Legionnaire's disease (HCC) (12/08/2023), and Retinitis pigmentosa.  Planned upcoming L total hip replacement by Dr Ernie ([facility]) 06/2024.   Patient has tolerated anesthesia well in the past.  Latest surgical intervention was right total hip replacement 2022 followed by lithotripsy ESWL by Dr Watt (03/2024).  Denies trouble with post-op nausea/vomiting, or trouble awakening after surgery.   Denies chest pain, dyspnea, palpitations, leg swelling, HA, dizziness.  No fevers/chills, coughing, abd pain, diarrhea, or UTI symptoms.   Revised cardiac risk index score = 0  Planned high risk surgery (intraperitoneal, intrathoracic, or suprainguinal vascular): No History of ischemic heart disease: No History of congestive heart failure: No History of cerebrovascular disease: No History of preoperative insulin treatment: No History of kidney disease with creatinine >2 mg/dL: No    Hospitalized for Legionnaire's disease 11/2023, s/p azithromycin  treatment with full resolution.   H/o retinitis pigmentosa followed by eye doctor yearly [place]) - he no longer drives (since 2000).   Hemochromatosis with iron overload homozygous for C282Y mutation goal ferritin <150, has  established with heme (Dr Federico).      Relevant past medical, surgical, family and social history reviewed and updated as indicated. Interim medical history since our last visit reviewed. Allergies and medications reviewed and updated. Outpatient Medications Prior to Visit  Medication Sig Dispense Refill   amLODipine  (NORVASC ) 5 MG tablet Take 1 tablet (5 mg total) by mouth daily. 90 tablet 4   diclofenac (VOLTAREN) 75 MG EC tablet TAKE 1 TABLET BY MOUTH TWICE A DAY WITH MEAL(S).     ibuprofen (ADVIL) 200 MG tablet Take 600 mg by mouth every 8 (eight) hours as needed for moderate pain (pain score 4-6).     ketorolac  (TORADOL ) 10 MG tablet Take 10 mg by mouth every 6 (six) hours.     methocarbamol  (ROBAXIN ) 500 MG tablet TAKE 1 TABLET BY MOUTH EVERY 6 HOURS AS NEEDED FOR MUSCLE SPASM     ondansetron  (ZOFRAN ) 4 MG tablet Take 4 mg by mouth every 6 (six) hours as needed. for nausea     ondansetron  (ZOFRAN -ODT) 4 MG disintegrating tablet Take 1 tablet (4 mg total) by mouth every 8 (eight) hours as needed. 20 tablet 0   oxyCODONE  (ROXICODONE ) 5 MG immediate release tablet Take 1 tablet (5 mg total) by mouth every 4 (four) hours as needed for up to 15 doses for breakthrough pain. 15 tablet 0   pilocarpine  (PILOCAR) 1 % ophthalmic solution Place 1 drop into the left eye 2 (two) times daily.      tamsulosin  (FLOMAX ) 0.4 MG CAPS capsule Take 0.4 mg by mouth daily.     No facility-administered medications prior to visit.  Per HPI unless specifically indicated in ROS section below Review of Systems  Objective:  BP 120/70   Pulse 77   Temp 98.4 F (36.9 C) (Oral)   Ht 5' 9 (1.753 m)   Wt 202 lb 8 oz (91.9 kg)   SpO2 96%   BMI 29.90 kg/m   Wt Readings from Last 3 Encounters:  04/30/24 202 lb 8 oz (91.9 kg)  01/01/24 196 lb 6.4 oz (89.1 kg)  12/27/23 193 lb (87.5 kg)      Physical Exam Vitals and nursing note reviewed.  Constitutional:      Appearance: Normal appearance. He is not  ill-appearing.  HENT:     Head: Normocephalic and atraumatic.     Mouth/Throat:     Mouth: Mucous membranes are moist.     Pharynx: Oropharynx is clear. No oropharyngeal exudate or posterior oropharyngeal erythema.  Eyes:     Extraocular Movements: Extraocular movements intact.     Conjunctiva/sclera: Conjunctivae normal.     Comments: Chronic anisocoria   Cardiovascular:     Rate and Rhythm: Normal rate and regular rhythm.     Pulses: Normal pulses.     Heart sounds: Normal heart sounds. No murmur heard. Pulmonary:     Effort: Pulmonary effort is normal. No respiratory distress.     Breath sounds: Normal breath sounds. No wheezing, rhonchi or rales.  Musculoskeletal:     Right lower leg: No edema.     Left lower leg: No edema.  Skin:    General: Skin is warm and dry.     Findings: No rash.  Neurological:     Mental Status: He is alert.  Psychiatric:        Mood and Affect: Mood normal.        Behavior: Behavior normal.       Results for orders placed or performed in visit on 04/30/24  Comprehensive metabolic panel with GFR   Collection Time: 04/30/24  1:14 PM  Result Value Ref Range   Sodium 141 135 - 145 mEq/L   Potassium 4.2 3.5 - 5.1 mEq/L   Chloride 106 96 - 112 mEq/L   CO2 27 19 - 32 mEq/L   Glucose, Bld 93 70 - 99 mg/dL   BUN 20 6 - 23 mg/dL   Creatinine, Ser 8.85 0.40 - 1.50 mg/dL   Total Bilirubin 1.6 (H) 0.2 - 1.2 mg/dL   Alkaline Phosphatase 66 39 - 117 U/L   AST 21 0 - 37 U/L   ALT 30 0 - 53 U/L   Total Protein 7.0 6.0 - 8.3 g/dL   Albumin 4.5 3.5 - 5.2 g/dL   GFR 29.36 >39.99 mL/min   Calcium 9.6 8.4 - 10.5 mg/dL  CBC with Differential/Platelet   Collection Time: 04/30/24  1:14 PM  Result Value Ref Range   WBC 7.3 4.0 - 10.5 K/uL   RBC 4.76 4.22 - 5.81 Mil/uL   Hemoglobin 15.1 13.0 - 17.0 g/dL   HCT 57.3 60.9 - 47.9 %   MCV 89.5 78.0 - 100.0 fl   MCHC 35.4 30.0 - 36.0 g/dL   RDW 86.4 88.4 - 84.4 %   Platelets 319.0 150.0 - 400.0 K/uL    Neutrophils Relative % 62.2 43.0 - 77.0 %   Lymphocytes Relative 26.7 12.0 - 46.0 %   Monocytes Relative 6.9 3.0 - 12.0 %   Eosinophils Relative 3.3 0.0 - 5.0 %   Basophils Relative 0.9 0.0 - 3.0 %   Neutro Abs  4.6 1.4 - 7.7 K/uL   Lymphs Abs 2.0 0.7 - 4.0 K/uL   Monocytes Absolute 0.5 0.1 - 1.0 K/uL   Eosinophils Absolute 0.2 0.0 - 0.7 K/uL   Basophils Absolute 0.1 0.0 - 0.1 K/uL  Ferritin   Collection Time: 04/30/24  1:14 PM  Result Value Ref Range   Ferritin 17.1 (L) 22.0 - 322.0 ng/mL  IBC panel   Collection Time: 04/30/24  1:14 PM  Result Value Ref Range   Iron 115 42 - 165 ug/dL   Transferrin 790.9 (L) 212.0 - 360.0 mg/dL   Saturation Ratios 60.6 20.0 - 50.0 %   TIBC 292.6 250.0 - 450.0 mcg/dL   DG Chest 2 View CLINICAL DATA:  Preop evaluation.  EXAM: CHEST - 2 VIEW  COMPARISON:  CT 03/05/2024  FINDINGS: The cardiomediastinal contours are normal. The lungs are clear. Pulmonary vasculature is normal. No consolidation, pleural effusion, or pneumothorax. No acute osseous abnormalities are seen.  IMPRESSION: No active cardiopulmonary disease.  Electronically Signed   By: Andrea Gasman [name]   On: 04/30/2024 17:39  EKG - NSR rate 70, normal axis, intervals, no hypertrophy or acute ST/T changes   Assessment & Plan:   Problem List Items Addressed This Visit     Retinitis pigmentosa   Hereditary hemochromatosis   Sees hematology Update iron levels prior to upcoming surgery.       Pre-op evaluation - Primary   RCRI = 0 Anticipate adequately low risk to proceed with planned surgical intervention. Check EKG, CXR, labs today including iron levels.  Forward results attn Dr Ernie.       Relevant Orders   Comprehensive metabolic panel with GFR (Completed)   CBC with Differential/Platelet (Completed)   Ferritin (Completed)   IBC panel (Completed)   EKG 12-Lead   DG Chest 2 View (Completed)   Hypertension   Chronic ,stable on amlodipine  - continue.          No orders of the defined types were placed in this encounter.   Orders Placed This Encounter  Procedures   DG Chest 2 View    Standing Status:   Future    Number of Occurrences:   1    Expiration Date:   04/30/2025    Reason for Exam (SYMPTOM  OR DIAGNOSIS REQUIRED):   preop eval    Preferred imaging location?:   [facility]   Comprehensive metabolic panel with GFR   CBC with Differential/Platelet   Ferritin   IBC panel   EKG 12-Lead    Patient Instructions  EKG, labs, chest xray today  We will forward results to Dr Ernie Richard to see you today I hope you have a speedy recovery!  Follow up plan: Return if symptoms worsen or fail to improve.  Anton Blas, MD

## 2024-04-30 NOTE — Patient Instructions (Signed)
 EKG, labs, chest xray today  We will forward results to Dr Ernie Richard to see you today I hope you have a speedy recovery!

## 2024-04-30 NOTE — Assessment & Plan Note (Signed)
 Sees hematology Update iron levels prior to upcoming surgery.

## 2024-04-30 NOTE — Assessment & Plan Note (Signed)
Chronic, stable on amlodipine - continue.

## 2024-04-30 NOTE — Assessment & Plan Note (Addendum)
 RCRI = 0 Anticipate adequately low risk to proceed with planned surgical intervention. Check EKG, CXR, labs today including iron levels.  Forward results attn Dr Ernie.

## 2024-05-01 ENCOUNTER — Encounter: Payer: Self-pay | Admitting: Family Medicine

## 2024-05-01 ENCOUNTER — Ambulatory Visit: Payer: Self-pay | Admitting: Family Medicine

## 2024-05-01 ENCOUNTER — Inpatient Hospital Stay

## 2024-05-03 ENCOUNTER — Other Ambulatory Visit: Payer: Self-pay | Admitting: Family Medicine

## 2024-05-04 NOTE — Telephone Encounter (Signed)
 ERx

## 2024-05-08 ENCOUNTER — Inpatient Hospital Stay

## 2024-05-15 ENCOUNTER — Inpatient Hospital Stay

## 2024-05-22 ENCOUNTER — Inpatient Hospital Stay

## 2024-05-29 ENCOUNTER — Inpatient Hospital Stay

## 2024-05-31 NOTE — Progress Notes (Signed)
 Sent message, via epic in basket, requesting orders in epic from Careers adviser.

## 2024-06-05 ENCOUNTER — Inpatient Hospital Stay

## 2024-06-05 NOTE — Patient Instructions (Addendum)
 SURGICAL WAITING ROOM VISITATION Patients having surgery or a procedure may have no more than 2 support people in the waiting area - these visitors may rotate.    Children under the age of 70 will not be allowed to visit due to the increase in respiratory illness  Children under the age of 52 must have an adult with them who is not the patient.  If the patient needs to stay at the hospital during part of their recovery, the visitor guidelines for inpatient rooms apply. Pre-op nurse will coordinate an appropriate time for 1 support person to accompany patient in pre-op.  This support person may not rotate.    Please refer to the [facility] website for the visitor guidelines for Inpatients (after your surgery is over and you are in a regular room).       Your procedure is scheduled on: 06-18-24   Report to [facility] Main Entrance    Report to admitting at 8:20 AM   Call this number if you have problems the morning of surgery [phone]   Do not eat food :After Midnight.   After Midnight you may have the following liquids until 7:45 AM DAY OF SURGERY  Water  Non-Citrus Juices (without pulp, NO RED-Apple, White grape, White cranberry) Black Coffee (NO MILK/CREAM OR CREAMERS, sugar ok)  Clear Tea (NO MILK/CREAM OR CREAMERS, sugar ok) regular and decaf                             Plain Jell-O (NO RED)                                           Fruit ices (not with fruit pulp, NO RED)                                     Popsicles (NO RED)                                                               Sports drinks like Gatorade (NO RED)                   The day of surgery:  Drink ONE (1) Pre-Surgery Clear Ensure by 7:45 AM the morning of surgery. Drink in one sitting. Do not sip.  This drink was given to you during your hospital  pre-op appointment visit. Nothing else to drink after completing the Pre-Surgery Clear Ensure.          If you have questions, please contact  your surgeons office.   FOLLOW  ANY ADDITIONAL PRE OP INSTRUCTIONS YOU RECEIVED FROM YOUR SURGEON'S OFFICE!!!     Oral Hygiene is also important to reduce your risk of infection.                                    Remember - BRUSH YOUR TEETH THE MORNING OF SURGERY WITH YOUR REGULAR TOOTHPASTE   Do NOT smoke after Midnight  Take these medicines the morning of surgery with A SIP OF WATER :    Amlodipine    Okay to use eyedrops  Stop all vitamins and herbal supplements 7 days before surgery                              You may not have any metal on your body including  jewelry, and body piercing             Do not wear lotions, powders, cologne, or deodorant              Men may shave face and neck.   Do not bring valuables to the hospital. [facility] IS NOT RESPONSIBLE   FOR VALUABLES.   Contacts, dentures or bridgework may not be worn into surgery.   Bring small overnight bag day of surgery.   DO NOT BRING YOUR HOME MEDICATIONS TO THE HOSPITAL. PHARMACY WILL DISPENSE MEDICATIONS LISTED ON YOUR MEDICATION LIST TO YOU DURING YOUR ADMISSION IN THE HOSPITAL!    Special Instructions: Bring a copy of your healthcare power of attorney and living will documents the day of surgery if you haven't scanned them before.              Please read over the following fact sheets you were given: IF YOU HAVE QUESTIONS ABOUT YOUR PRE-OP INSTRUCTIONS PLEASE CALL [phone] [facility]  If you received a COVID test during your pre-op visit  it is requested that you wear a mask when out in public, stay away from anyone that may not be feeling well and notify your surgeon if you develop symptoms. If you test positive for Covid or have been in contact with anyone that has tested positive in the last 10 days please notify you surgeon.   Pre-operative 4 CHG Bath Instructions  DYNA-Hex 4 Chlorhexidine  Gluconate 4% Solution Antiseptic 4 fl. oz   You can play a key role in reducing the risk of infection  after surgery. Your skin needs to be as free of germs as possible. You can reduce the number of germs on your skin by washing with CHG (chlorhexidine  gluconate) soap before surgery. CHG is an antiseptic soap that kills germs and continues to kill germs even after washing.   DO NOT use if you have an allergy to chlorhexidine /CHG or antibacterial soaps. If your skin becomes reddened or irritated, stop using the CHG and notify one of our RNs at   Please shower with the CHG soap starting 4 days before surgery using the following schedule:     Please keep in mind the following:  DO NOT shave, including legs and underarms, starting the day of your first shower.   You may shave your face at any point before/day of surgery.  Place clean sheets on your bed the day you start using CHG soap. Use a clean washcloth (not used since being washed) for each shower. DO NOT sleep with pets once you start using the CHG.  CHG Shower Instructions:  If you choose to wash your hair and private area, wash first with your normal shampoo/soap.  After you use shampoo/soap, rinse your hair and body thoroughly to remove shampoo/soap residue.  Turn the water  OFF and apply about 3 tablespoons (45 ml) of CHG soap to a CLEAN washcloth.  Apply CHG soap ONLY FROM YOUR NECK DOWN TO YOUR TOES (washing for 3-5 minutes)  DO NOT use CHG soap on face, private  areas, open wounds, or sores.  Pay special attention to the area where your surgery is being performed.  If you are having back surgery, having someone wash your back for you may be helpful. Wait 2 minutes after CHG soap is applied, then you may rinse off the CHG soap.  Pat dry with a clean towel  Put on clean clothes/pajamas   If you choose to wear lotion, please use ONLY the CHG-compatible lotions on the back of this paper.     Additional instructions for the day of surgery:  Shower with regular soap the day of surgery DO NOT APPLY any lotions, deodorants, cologne, or  perfumes.   Put on clean/comfortable clothes.  Brush your teeth.  Ask your nurse before applying any prescription medications to the skin.   CHG Compatible Lotions   Aveeno Moisturizing lotion  Cetaphil Moisturizing Cream  Cetaphil Moisturizing Lotion  Clairol Herbal Essence Moisturizing Lotion, Dry Skin  Clairol Herbal Essence Moisturizing Lotion, Extra Dry Skin  Clairol Herbal Essence Moisturizing Lotion, Normal Skin  Curel Age Defying Therapeutic Moisturizing Lotion with Alpha Hydroxy  Curel Extreme Care Body Lotion  Curel Soothing Hands Moisturizing Hand Lotion  Curel Therapeutic Moisturizing Cream, Fragrance-Free  Curel Therapeutic Moisturizing Lotion, Fragrance-Free  Curel Therapeutic Moisturizing Lotion, Original Formula  Eucerin Daily Replenishing Lotion  Eucerin Dry Skin Therapy Plus Alpha Hydroxy Crme  Eucerin Dry Skin Therapy Plus Alpha Hydroxy Lotion  Eucerin Original Crme  Eucerin Original Lotion  Eucerin Plus Crme Eucerin Plus Lotion  Eucerin TriLipid Replenishing Lotion  Keri Anti-Bacterial Hand Lotion  Keri Deep Conditioning Original Lotion Dry Skin Formula Softly Scented  Keri Deep Conditioning Original Lotion, Fragrance Free Sensitive Skin Formula  Keri Lotion Fast Absorbing Fragrance Free Sensitive Skin Formula  Keri Lotion Fast Absorbing Softly Scented Dry Skin Formula  Keri Original Lotion  Keri Skin Renewal Lotion Keri Silky Smooth Lotion  Keri Silky Smooth Sensitive Skin Lotion  Nivea Body Creamy Conditioning Oil  Nivea Body Extra Enriched Lotion  Nivea Body Original Lotion  Nivea Body Sheer Moisturizing Lotion Nivea Crme  Nivea Skin Firming Lotion  NutraDerm 30 Skin Lotion  NutraDerm Skin Lotion  NutraDerm Therapeutic Skin Cream  NutraDerm Therapeutic Skin Lotion  ProShield Protective Hand Cream  Provon moisturizing lotion   PATIENT SIGNATURE_________________________________  NURSE  SIGNATURE__________________________________  ________________________________________________________________________    Michael Vaughan  An incentive spirometer is a tool that can help keep your lungs clear and active. This tool measures how well you are filling your lungs with each breath. Taking long deep breaths may help reverse or decrease the chance of developing breathing (pulmonary) problems (especially infection) following: A long period of time when you are unable to move or be active. BEFORE THE PROCEDURE  If the spirometer includes an indicator to show your best effort, your nurse or respiratory therapist will set it to a desired goal. If possible, sit up straight or lean slightly forward. Try not to slouch. Hold the incentive spirometer in an upright position. INSTRUCTIONS FOR USE  Sit on the edge of your bed if possible, or sit up as far as you can in bed or on a chair. Hold the incentive spirometer in an upright position. Breathe out normally. Place the mouthpiece in your mouth and seal your lips tightly around it. Breathe in slowly and as deeply as possible, raising the piston or the ball toward the top of the column. Hold your breath for 3-5 seconds or for as long as possible. Allow the piston or  ball to fall to the bottom of the column. Remove the mouthpiece from your mouth and breathe out normally. Rest for a few seconds and repeat Steps 1 through 7 at least 10 times every 1-2 hours when you are awake. Take your time and take a few normal breaths between deep breaths. The spirometer may include an indicator to show your best effort. Use the indicator as a goal to work toward during each repetition. After each set of 10 deep breaths, practice coughing to be sure your lungs are clear. If you have an incision (the cut made at the time of surgery), support your incision when coughing by placing a pillow or rolled up towels firmly against it. Once you are able to get out of  bed, walk around indoors and cough well. You may stop using the incentive spirometer when instructed by your caregiver.  RISKS AND COMPLICATIONS Take your time so you do not get dizzy or light-headed. If you are in pain, you may need to take or ask for pain medication before doing incentive spirometry. It is harder to take a deep breath if you are having pain. AFTER USE Rest and breathe slowly and easily. It can be helpful to keep track of a log of your progress. Your caregiver can provide you with a simple table to help with this. If you are using the spirometer at home, follow these instructions: SEEK MEDICAL CARE IF:  You are having difficultly using the spirometer. You have trouble using the spirometer as often as instructed. Your pain medication is not giving enough relief while using the spirometer. You develop fever of 100.5 F (38.1 C) or higher. SEEK IMMEDIATE MEDICAL CARE IF:  You cough up bloody sputum that had not been present before. You develop fever of 102 F (38.9 C) or greater. You develop worsening pain at or near the incision site. MAKE SURE YOU:  Understand these instructions. Will watch your condition. Will get help right away if you are not doing well or get worse. Document Released: 10/10/2006 Document Revised: 08/22/2011 Document Reviewed: 12/11/2006 Exit[employer] Patient Information 2014 [employer], [place].   ________________________________________________________________________ WHAT IS A BLOOD TRANSFUSION? Blood Transfusion Information  A transfusion is the replacement of blood or some of its parts. Blood is made up of multiple cells which provide different functions. Red blood cells carry oxygen and are used for blood loss replacement. White blood cells fight against infection. Platelets control bleeding. Plasma helps clot blood. Other blood products are available for specialized needs, such as hemophilia or other clotting disorders. BEFORE THE TRANSFUSION   Who gives blood for transfusions?  Healthy volunteers who are fully evaluated to make sure their blood is safe. This is blood bank blood. Transfusion therapy is the safest it has ever been in the practice of medicine. Before blood is taken from a donor, a complete history is taken to make sure that person has no history of diseases nor engages in risky social behavior (examples are intravenous drug use or sexual activity with multiple partners). The donor's travel history is screened to minimize risk of transmitting infections, such as malaria. The donated blood is tested for signs of infectious diseases, such as HIV and hepatitis. The blood is then tested to be sure it is compatible with you in order to minimize the chance of a transfusion reaction. If you or a relative donates blood, this is often done in anticipation of surgery and is not appropriate for emergency situations. It takes many days to process  the donated blood. RISKS AND COMPLICATIONS Although transfusion therapy is very safe and saves many lives, the main dangers of transfusion include:  Getting an infectious disease. Developing a transfusion reaction. This is an allergic reaction to something in the blood you were given. Every precaution is taken to prevent this. The decision to have a blood transfusion has been considered carefully by your caregiver before blood is given. Blood is not given unless the benefits outweigh the risks. AFTER THE TRANSFUSION Right after receiving a blood transfusion, you will usually feel much better and more energetic. This is especially true if your red blood cells have gotten low (anemic). The transfusion raises the level of the red blood cells which carry oxygen, and this usually causes an energy increase. The nurse administering the transfusion will monitor you carefully for complications. HOME CARE INSTRUCTIONS  No special instructions are needed after a transfusion. You may find your energy is  better. Speak with your caregiver about any limitations on activity for underlying diseases you may have. SEEK MEDICAL CARE IF:  Your condition is not improving after your transfusion. You develop redness or irritation at the intravenous (IV) site. SEEK IMMEDIATE MEDICAL CARE IF:  Any of the following symptoms occur over the next 12 hours: Shaking chills. You have a temperature by mouth above 102 F (38.9 C), not controlled by medicine. Chest, back, or muscle pain. People around you feel you are not acting correctly or are confused. Shortness of breath or difficulty breathing. Dizziness and fainting. You get a rash or develop hives. You have a decrease in urine output. Your urine turns a dark color or changes to pink, red, or brown. Any of the following symptoms occur over the next 10 days: You have a temperature by mouth above 102 F (38.9 C), not controlled by medicine. Shortness of breath. Weakness after normal activity. The white part of the eye turns yellow (jaundice). You have a decrease in the amount of urine or are urinating less often. Your urine turns a dark color or changes to pink, red, or brown. Document Released: 05/27/2000 Document Revised: 08/22/2011 Document Reviewed: 01/14/2008 [facility] Patient Information 2014 [place], [place].  _______________________________________________________________________

## 2024-06-05 NOTE — Progress Notes (Signed)
 Second request for pre op IB. CANDIE Sic.

## 2024-06-05 NOTE — Progress Notes (Addendum)
 Date of COVID positive in last 90 days:  NO  PCP - Anton Blas, MD Cardiologist - N/A  Chest x-ray - 04-30-24 Epic EKG - 04-30-24 Epic Stress Test - N/A ECHO - 07-19-21 Epic Cardiac Cath - N/A Pacemaker/ICD device last checked:N/A Spinal Cord Stimulator:N/A  Bowel Prep - N/A  Sleep Study - N/A CPAP -   Fasting Blood Sugar - N/A Checks Blood Sugar _____ times a day  Last dose of GLP1 agonist-  N/A GLP1 instructions:  Do not take after     Last dose of SGLT-2 inhibitors-  N/A SGLT-2 instructions:  Do not take after    Blood Thinner Instructions: N/A Last dose:   Time: Aspirin  Instructions:N/A Last Dose:  Activity level:  Can go up a flight of stairs and perform activities of daily living without stopping and without symptoms of chest pain or shortness of breath.  Anesthesia review: N/A  Patient denies shortness of breath, fever, cough and chest pain at PAT appointment  Patient verbalized understanding of instructions that were given to them at the PAT appointment. Patient was also instructed that they will need to review over the PAT instructions again at home before surgery.

## 2024-06-10 ENCOUNTER — Encounter (HOSPITAL_COMMUNITY)
Admission: RE | Admit: 2024-06-10 | Discharge: 2024-06-10 | Disposition: A | Discharge status: Home / Self Care | Source: Ambulatory Visit | Attending: Orthopedic Surgery | Admitting: Orthopedic Surgery

## 2024-06-10 ENCOUNTER — Other Ambulatory Visit: Payer: Self-pay

## 2024-06-10 ENCOUNTER — Encounter (HOSPITAL_COMMUNITY): Payer: Self-pay

## 2024-06-10 VITALS — BP 139/82 | HR 58 | Temp 98.1°F | Resp 16 | Ht 70.0 in | Wt 207.2 lb

## 2024-06-10 DIAGNOSIS — M1612 Unilateral primary osteoarthritis, left hip: Secondary | ICD-10-CM | POA: Insufficient documentation

## 2024-06-10 DIAGNOSIS — Z01818 Encounter for other preprocedural examination: Secondary | ICD-10-CM

## 2024-06-10 DIAGNOSIS — Z01812 Encounter for preprocedural laboratory examination: Secondary | ICD-10-CM | POA: Insufficient documentation

## 2024-06-10 DIAGNOSIS — I10 Essential (primary) hypertension: Secondary | ICD-10-CM

## 2024-06-10 HISTORY — DX: Gastro-esophageal reflux disease without esophagitis: K21.9

## 2024-06-10 HISTORY — DX: Personal history of urinary calculi: Z87.442

## 2024-06-10 HISTORY — DX: Essential (primary) hypertension: I10

## 2024-06-10 LAB — CBC
HCT: 41.2 % (ref 39.0–52.0)
Hemoglobin: 14.3 g/dL (ref 13.0–17.0)
MCH: 31.6 pg (ref 26.0–34.0)
MCHC: 34.7 g/dL (ref 30.0–36.0)
MCV: 90.9 fL (ref 80.0–100.0)
Platelets: 267 K/uL (ref 150–400)
RBC: 4.53 MIL/uL (ref 4.22–5.81)
RDW: 13.7 % (ref 11.5–15.5)
WBC: 6.9 K/uL (ref 4.0–10.5)
nRBC: 0 % (ref 0.0–0.2)

## 2024-06-10 LAB — BASIC METABOLIC PANEL WITH GFR
Anion gap: 10 (ref 5–15)
BUN: 14 mg/dL (ref 6–20)
CO2: 26 mmol/L (ref 22–32)
Calcium: 9 mg/dL (ref 8.9–10.3)
Chloride: 106 mmol/L (ref 98–111)
Creatinine, Ser: 0.94 mg/dL (ref 0.61–1.24)
GFR, Estimated: >60 mL/min
Glucose, Bld: 107 mg/dL — ABNORMAL HIGH (ref 70–99)
Potassium: 3.7 mmol/L (ref 3.5–5.1)
Sodium: 142 mmol/L (ref 135–145)

## 2024-06-10 LAB — SURGICAL PCR SCREEN
MRSA, PCR: NEGATIVE
Staphylococcus aureus: NEGATIVE

## 2024-06-12 ENCOUNTER — Inpatient Hospital Stay

## 2024-06-17 NOTE — Anesthesia Preprocedure Evaluation (Addendum)
 "                                  Anesthesia Evaluation  Patient identified by MRN, date of birth, ID band Patient awake    Reviewed: Allergy & Precautions, NPO status , Patient's Chart, lab work & pertinent test results  History of Anesthesia Complications Negative for: history of anesthetic complications  Airway Mallampati: II  TM Distance: >3 FB Neck ROM: Full    Dental no notable dental hx. (+) Teeth Intact, Dental Advisory Given   Pulmonary former smoker   Pulmonary exam normal breath sounds clear to auscultation       Cardiovascular hypertension, Pt. on medications and Pt. on home beta blockers (-) angina (-) Past MI Normal cardiovascular exam Rhythm:Regular Rate:Normal     Neuro/Psych    GI/Hepatic ,GERD  ,,  Endo/Other    Renal/GU Renal diseaseLab Results      Component                Value               Date                              K                        3.7                 06/10/2024                    CREATININE               0.94                06/10/2024                   Musculoskeletal  (+) Arthritis ,  S/p r hip 2022   Abdominal   Peds  Hematology Lab Results      Component                Value               Date                      WBC                      6.9                 06/10/2024                HGB                      14.3                06/10/2024                HCT                      41.2                06/10/2024                MCV  90.9                06/10/2024                PLT                      267                 06/10/2024              Anesthesia Other Findings All: augmentin  AK (actinic keratosis)    Reproductive/Obstetrics                              Anesthesia Physical Anesthesia Plan  ASA: 2  Anesthesia Plan: Spinal   Post-op Pain Management: Regional block* and Ofirmev  IV (intra-op)*   Induction:   PONV Risk Score and Plan:  Propofol  infusion, Treatment may vary due to age or medical condition, Midazolam  and Ondansetron   Airway Management Planned: Nasal Cannula and Natural Airway  Additional Equipment: None  Intra-op Plan:   Post-operative Plan:   Informed Consent: I have reviewed the patients History and Physical, chart, labs and discussed the procedure including the risks, benefits and alternatives for the proposed anesthesia with the patient or authorized representative who has indicated his/her understanding and acceptance.     Dental advisory given  Plan Discussed with: CRNA and Surgeon  Anesthesia Plan Comments:          Anesthesia Quick Evaluation  "

## 2024-06-17 NOTE — H&P (Signed)
 TOTAL HIP ADMISSION H&P  Patient is admitted for left total hip arthroplasty.   Therapy Plans: HEP Disposition: Home with wife Planned DVT Prophylaxis: aspirin  81mg  BID DME needed: none PCP: Dr. Elpidio - clearance received TXA: IV Allergies: augementin Anesthesia Concerns: none BMI: 30.8 Last HgbA1c: not diabetic   Other: - Did fantastic with his right THA in 2023 - oxycodone , robaxin , tylenol , celebrex  - staying overnight  Subjective:  Chief Complaint: Left hip pain  HPI: Michael Vaughan, 59 y.o. male, has a history of pain and functional disability in the left hip due to arthritis and patient has failed non-surgical conservative treatments for greater than 12 weeks to include NSAID's and/or analgesics and activity modification. Onset of symptoms was gradual, starting 2 years ago with gradual worsening course since that time. The patient noted no prior surgeries on the left hip. Patient currently rates pain in the left hip at 8 out of 10 with activity. Patient has worsening of pain with activity and weight bearing, pain that interfers with activities of daily living, and pain with passive range of motion. Patient has evidence of joint space narrowing by imaging studies. This condition presents safety issues increasing the risk of falls. There is no current active infection.  Patient Active Problem List   Diagnosis Date Noted   Ureteral calculus 03/18/2024   Diarrhea 12/08/2023   Hypertension 12/26/2022   AK (actinic keratosis) 12/22/2021   S/P total right hip arthroplasty 04/27/2021   Pre-op evaluation 03/03/2021   Pain of right hip joint 02/03/2021   Fatigue 12/21/2020   Subclinical hypothyroidism 12/06/2017   Dyslipidemia 11/02/2016   Advanced care planning/counseling discussion 09/10/2014   Health maintenance examination 09/10/2014   Iron overload 05/13/2013   Hereditary hemochromatosis 05/13/2013   Medicare annual wellness visit, subsequent 05/24/2012   Arthropathy  of both hands    Retinitis pigmentosa     Past Medical History:  Diagnosis Date   Allergy    Arthralgia    takes aleve regularly   Arthritis    CAP (community acquired pneumonia) 12/02/2023   Cataract    GERD (gastroesophageal reflux disease)    Heartburn    Hereditary hemochromatosis (HCC) 05/2013   iron overload with mild HM, homozygous for C282Y, monthly blood draws   History of chicken pox    History of kidney stones    History of migraine 1990s   remote   Hypertension    Legionnaire's disease (HCC) 12/08/2023   Retinitis pigmentosa     Past Surgical History:  Procedure Laterality Date   CATARACT EXTRACTION BILATERAL W/ ANTERIOR VITRECTOMY Bilateral 2001   COLONOSCOPY  12/2016   WNL Oma)   EXTRACORPOREAL SHOCK WAVE LITHOTRIPSY Left 03/15/2024   Procedure: LITHOTRIPSY, ESWL;  Surgeon: Watt Rush, MD;  Location: [facility];  Service: Urology;  Laterality: Left;   EYE SURGERY  02/1999   INGUINAL HERNIA REPAIR  1972   at age 54   IRIDECTOMY Right 01/14/2014   Procedure: IRIDECTOMY;  Surgeon: Rush JONETTA Ku, MD;  Location: [facility] OR;  Service: Ophthalmology;  Laterality: Right;   JOINT REPLACEMENT  04/27/21   PARS PLANA VITRECTOMY  01/24/2012   Procedure: PARS PLANA VITRECTOMY WITH 25G REMOVAL/SUTURE INTRAOCULAR LENS;  Surgeon: Rush JONETTA Ku, MD;  Location: [facility] OR;  Service: Ophthalmology;  Laterality: Left;   PARS PLANA VITRECTOMY Right 01/14/2014   dislocated intraocular lens - PARS PLANA VITRECTOMY WITH 25G REMOVAL/SUTURE SECONDARY INTRAOCULAR LENS;  Rush JONETTA Ku, MD   PHOTOCOAGULATION WITH LASER Right 01/14/2014  Procedure: PHOTOCOAGULATION WITH LASER;  Surgeon: Norleen JONETTA Ku, MD;  Location: [facility] OR;  Service: Ophthalmology;  Laterality: Right;   TOTAL HIP ARTHROPLASTY Right 04/27/2021   Procedure: TOTAL HIP ARTHROPLASTY ANTERIOR APPROACH;  Surgeon: Ernie Cough, MD;  Location: [facility];  Service: Orthopedics;  Laterality: Right;    Prior to Admission medications   Medication Sig Start Date End Date Taking? Authorizing Provider  amLODipine  (NORVASC ) 5 MG tablet TAKE 1 TABLET (5 MG TOTAL) BY MOUTH DAILY. 05/04/24  Yes Rilla Baller, MD  diclofenac (VOLTAREN) 75 MG EC tablet TAKE 1 TABLET BY MOUTH TWICE A DAY WITH MEAL(S). 04/17/24  Yes [provider]  pilocarpine  (PILOCAR) 1 % ophthalmic solution Place 1 drop into the left eye 2 (two) times daily.  05/04/12  Yes [provider]    Allergies[1]  Social History   Socioeconomic History   Marital status: Single    Spouse name: Not on file   Number of children: 2   Years of education: Not on file   Highest education level: 12th grade  Occupational History   Occupation: disabled  Tobacco Use   Smoking status: Former    Current packs/day: 0.00    Average packs/day: 1.0 packs/day    Types: Cigarettes    Quit date: 03/12/2004    Years since quitting: 20.2   Smokeless tobacco: Current    Types: Snuff    Last attempt to quit: 10/12/2015   Tobacco comments:    quit in 2005, occasional smokeless tobacco - now using the [employer] tobacco free snuff  Vaping Use   Vaping status: Never Used  Substance and Sexual Activity   Alcohol use: Yes    Alcohol/week: 0.0 standard drinks of alcohol    Comment: rarely   Drug use: No   Sexual activity: Yes  Other Topics Concern   Not on file  Social History Narrative   Caffeine: 4-6 cups coffee   Lives with wife, 1 son, other grown children, dog and cows   Occupation: on disability for vision loss, legally blind, raises cows   Edu: HS   Activity: raises cows, some hunting/fishing   Diet: good water , daily fruits/vegetables   Social Drivers of Health   Tobacco Use: High Risk (06/10/2024)   Patient History    Smoking Tobacco Use: Former    Smokeless Tobacco Use: Current    Passive Exposure: Not on Actuary Strain: Low Risk (04/28/2024)   Overall Financial Resource Strain (CARDIA)    Difficulty of Paying Living  Expenses: Not very hard  Food Insecurity: No Food Insecurity (04/28/2024)   Epic    Worried About Radiation Protection Practitioner of Food in the Last Year: Never true    Ran Out of Food in the Last Year: Never true  Transportation Needs: No Transportation Needs (04/28/2024)   Epic    Lack of Transportation (Medical): No    Lack of Transportation (Non-Medical): No  Physical Activity: Inactive (04/28/2024)   Exercise Vital Sign    Days of Exercise per Week: 0 days    Minutes of Exercise per Session: Not on file  Stress: No Stress Concern Present (04/28/2024)   [employer] of Occupational Health - Occupational Stress Questionnaire    Feeling of Stress: Not at all  Social Connections: Socially Integrated (04/28/2024)   Social Connection and Isolation Panel    Frequency of Communication with Friends and Family: More than three times a week    Frequency of Social Gatherings with Friends and Family:  More than three times a week    Attends Religious Services: More than 4 times per year    Active Member of Clubs or Organizations: Yes    Attends Club or Organization Meetings: More than 4 times per year    Marital Status: Married  Catering Manager Violence: Not At Risk (01/03/2024)   Epic    Fear of Current or Ex-Partner: No    Emotionally Abused: No    Physically Abused: No    Sexually Abused: No  Depression (PHQ2-9): Low Risk (04/30/2024)   Depression (PHQ2-9)    PHQ-2 Score: 0  Alcohol Screen: Low Risk (04/28/2024)   Alcohol Screen    Last Alcohol Screening Score (AUDIT): 5  Housing: Unknown (04/28/2024)   Epic    Unable to Pay for Housing in the Last Year: No    Number of Times Moved in the Last Year: Not on file    Homeless in the Last Year: No  Utilities: Not At Risk (01/03/2024)   Epic    Threatened with loss of utilities: No  Health Literacy: Adequate Health Literacy (12/26/2023)   B1300 Health Literacy    Frequency of need for help with medical instructions: Never    Tobacco Use: High  Risk (06/10/2024)   Patient History    Smoking Tobacco Use: Former    Smokeless Tobacco Use: Current    Passive Exposure: Not on file   Social History   Substance and Sexual Activity  Alcohol Use Yes   Alcohol/week: 0.0 standard drinks of alcohol   Comment: rarely    Family History  Problem Relation Age of Onset   Colon cancer Paternal Aunt 75   Leukemia Father        CLL spread to brain   Hypertension Father        and mother and family   Cancer Father    Breast cancer Paternal Aunt    Prostate cancer Brother 47   Hyperlipidemia Other        father's side   CAD Paternal Uncle        MI, CABG   Stroke Maternal Grandmother    Diabetes Other        mother's side   Deep vein thrombosis Brother        with PE   Stroke Maternal Grandfather    Vision loss Maternal Grandfather    Hypertension Mother    Arthritis Mother    Vision loss Mother    Diabetes Maternal Aunt    Esophageal cancer Neg Hx    Liver cancer Neg Hx    Pancreatic cancer Neg Hx    Rectal cancer Neg Hx    Stomach cancer Neg Hx     Review Of Systems: Constitutional: Constitutional: no fever, chills, night sweats, or significant weight loss. Cardiovascular: Cardiovascular: no palpitations or chest pain. Respiratory: Respiratory: no cough or shortness of breath and No COPD. Gastrointestinal: Gastrointestinal: no vomiting or nausea. Musculoskeletal: Musculoskeletal: Joint Pain and swelling in Joints. Neurologic: Neurologic: no numbness, tingling, or difficulty with balance.   Objective:  Physical Exam: Left hip exam: Reproducible groin pain with hip flexion internal rotation over 5 degrees with pelvic tilting, external rotation close to 30 degrees with reproducible groin pain Mild external rotation contracture with active hip flexion with maintenance of full strength Right hip exam reveals more fluid range of motion without reproducible pain from 20 degrees of internal 30 degrees of external rotation  following arthroplasty Right knee exam: No palpable effusion, warmth  or erythema Full knee extension and flexion of 120 degrees Mild tenderness medial Mild crepitation  Vital signs in last 24 hours:    Imaging Review Plain radiographs demonstrate severe degenerative joint disease of the left hip. The bone quality appears to be adequate for age and reported activity level.  Assessment/Plan:  End stage arthritis, left hip  The patient history, physical examination, clinical judgement of the provider and imaging studies are consistent with end stage degenerative joint disease of the left hip and total hip arthroplasty is deemed medically necessary. The treatment options including medical management, injection therapy, arthroscopy and arthroplasty were discussed at length. The risks and benefits of total hip arthroplasty were presented and reviewed. The risks due to aseptic loosening, infection, stiffness, dislocation/subluxation, thromboembolic complications and other imponderables were discussed. The patient acknowledged the explanation, agreed to proceed with the plan and consent was signed. Patient is being admitted for inpatient treatment for surgery, pain control, PT, OT, prophylactic antibiotics, VTE prophylaxis, progressive ambulation and ADLs and discharge planning.The patient is planning to be discharged home.  Rosina Calin, PA-C Orthopedic Surgery [facility] [place] [phone]      [1]  Allergies Allergen Reactions   Augmentin [Amoxicillin-Pot Clavulanate] Shortness Of Breath, Swelling and Rash    No problems with amox

## 2024-06-18 ENCOUNTER — Other Ambulatory Visit: Payer: Self-pay

## 2024-06-18 ENCOUNTER — Observation Stay (HOSPITAL_COMMUNITY)

## 2024-06-18 ENCOUNTER — Observation Stay (HOSPITAL_COMMUNITY)
Admission: RE | Admit: 2024-06-18 | Discharge: 2024-06-19 | Disposition: A | Discharge status: Home / Self Care | Source: Ambulatory Visit | Attending: Orthopedic Surgery | Admitting: Orthopedic Surgery

## 2024-06-18 ENCOUNTER — Ambulatory Visit (HOSPITAL_COMMUNITY): Admitting: Anesthesiology

## 2024-06-18 ENCOUNTER — Encounter (HOSPITAL_COMMUNITY): Payer: Self-pay | Admitting: Orthopedic Surgery

## 2024-06-18 ENCOUNTER — Ambulatory Visit (HOSPITAL_COMMUNITY)

## 2024-06-18 ENCOUNTER — Encounter (HOSPITAL_COMMUNITY): Admission: RE | Payer: Self-pay | Source: Ambulatory Visit

## 2024-06-18 DIAGNOSIS — Z87891 Personal history of nicotine dependence: Secondary | ICD-10-CM | POA: Diagnosis not present

## 2024-06-18 DIAGNOSIS — Z79899 Other long term (current) drug therapy: Secondary | ICD-10-CM | POA: Diagnosis not present

## 2024-06-18 DIAGNOSIS — Z01818 Encounter for other preprocedural examination: Secondary | ICD-10-CM

## 2024-06-18 DIAGNOSIS — M1612 Unilateral primary osteoarthritis, left hip: Principal | ICD-10-CM | POA: Insufficient documentation

## 2024-06-18 DIAGNOSIS — I10 Essential (primary) hypertension: Secondary | ICD-10-CM | POA: Insufficient documentation

## 2024-06-18 DIAGNOSIS — Z96642 Presence of left artificial hip joint: Secondary | ICD-10-CM

## 2024-06-18 DIAGNOSIS — M25552 Pain in left hip: Secondary | ICD-10-CM | POA: Diagnosis present

## 2024-06-18 HISTORY — PX: TOTAL HIP ARTHROPLASTY: SHX124

## 2024-06-18 LAB — TYPE AND SCREEN
ABO/RH(D): O POS
Antibody Screen: NEGATIVE

## 2024-06-18 MED ORDER — KETOROLAC TROMETHAMINE 30 MG/ML IJ SOLN
INTRAMUSCULAR | Status: AC
  Filled 2024-06-18: qty 1

## 2024-06-18 MED ORDER — LACTATED RINGERS IV SOLN: INTRAVENOUS | Status: DC | PRN

## 2024-06-18 MED ORDER — FENTANYL CITRATE (PF) 100 MCG/2ML IJ SOLN
INTRAMUSCULAR | Status: AC
  Filled 2024-06-18: qty 2

## 2024-06-18 MED ORDER — PHENYLEPHRINE 80 MCG/ML (10ML) SYRINGE FOR IV PUSH (FOR BLOOD PRESSURE SUPPORT)
PREFILLED_SYRINGE | INTRAVENOUS | Status: AC
  Filled 2024-06-18: qty 10

## 2024-06-18 MED ORDER — CEFAZOLIN SODIUM-DEXTROSE 2-4 GM/100ML-% IV SOLN
2.0000 g | Freq: Four times a day (QID) | INTRAVENOUS | Status: AC
  Administered 2024-06-18 (×2): 2 g via INTRAVENOUS
  Filled 2024-06-18 (×2): qty 100

## 2024-06-18 MED ORDER — LIDOCAINE HCL (PF) 2 % IJ SOLN
INTRAMUSCULAR | Status: AC
  Filled 2024-06-18: qty 5

## 2024-06-18 MED ORDER — LACTATED RINGERS IV SOLN: INTRAVENOUS | Status: DC

## 2024-06-18 MED ORDER — CEFAZOLIN SODIUM-DEXTROSE 2-4 GM/100ML-% IV SOLN
2.0000 g | INTRAVENOUS | Status: AC
  Administered 2024-06-18: 2 g via INTRAVENOUS
  Filled 2024-06-18: qty 100

## 2024-06-18 MED ORDER — PHENOL 1.4 % MT LIQD: 1.0000 | OROMUCOSAL | Status: DC | PRN

## 2024-06-18 MED ORDER — PROPOFOL 10 MG/ML IV BOLUS
INTRAVENOUS | Status: DC | PRN
  Administered 2024-06-18: 30 mg via INTRAVENOUS
  Administered 2024-06-18: 115 ug/kg/min via INTRAVENOUS

## 2024-06-18 MED ORDER — SODIUM CHLORIDE (PF) 0.9 % IJ SOLN
INTRAMUSCULAR | Status: AC
  Filled 2024-06-18: qty 30

## 2024-06-18 MED ORDER — ALBUMIN HUMAN 5 % IV SOLN: INTRAVENOUS | Status: DC | PRN

## 2024-06-18 MED ORDER — MEPIVACAINE HCL (PF) 2 % IJ SOLN
INTRAMUSCULAR | Status: DC | PRN
  Administered 2024-06-18: 60 mg via INTRATHECAL

## 2024-06-18 MED ORDER — BUPIVACAINE-EPINEPHRINE (PF) 0.25% -1:200000 IJ SOLN
INTRAMUSCULAR | Status: AC
  Filled 2024-06-18: qty 30

## 2024-06-18 MED ORDER — BISACODYL 10 MG RE SUPP: 10.0000 mg | Freq: Every day | RECTAL | Status: DC | PRN

## 2024-06-18 MED ORDER — DEXAMETHASONE SOD PHOSPHATE PF 10 MG/ML IJ SOLN
10.0000 mg | Freq: Once | INTRAMUSCULAR | Status: AC
  Administered 2024-06-19: 10 mg via INTRAVENOUS
  Filled 2024-06-18: qty 1

## 2024-06-18 MED ORDER — HYDROMORPHONE HCL 1 MG/ML IJ SOLN
0.5000 mg | INTRAMUSCULAR | Status: DC | PRN
  Administered 2024-06-18: 0.5 mg via INTRAVENOUS
  Filled 2024-06-18: qty 1

## 2024-06-18 MED ORDER — PROPOFOL 10 MG/ML IV BOLUS
INTRAVENOUS | Status: AC
  Filled 2024-06-18: qty 20

## 2024-06-18 MED ORDER — METOCLOPRAMIDE HCL 5 MG PO TABS: 5.0000 mg | ORAL_TABLET | Freq: Three times a day (TID) | ORAL | Status: DC | PRN

## 2024-06-18 MED ORDER — POLYETHYLENE GLYCOL 3350 17 G PO PACK
17.0000 g | PACK | Freq: Two times a day (BID) | ORAL | Status: DC
  Administered 2024-06-18 – 2024-06-19 (×3): 17 g via ORAL
  Filled 2024-06-18 (×3): qty 1

## 2024-06-18 MED ORDER — AMLODIPINE BESYLATE 5 MG PO TABS
5.0000 mg | ORAL_TABLET | Freq: Every day | ORAL | Status: DC
  Administered 2024-06-19: 5 mg via ORAL
  Filled 2024-06-18: qty 1

## 2024-06-18 MED ORDER — SENNA 8.6 MG PO TABS
2.0000 | ORAL_TABLET | Freq: Every day | ORAL | Status: DC
  Administered 2024-06-18: 17.2 mg via ORAL
  Filled 2024-06-18: qty 2

## 2024-06-18 MED ORDER — DEXAMETHASONE SOD PHOSPHATE PF 10 MG/ML IJ SOLN: 8.0000 mg | Freq: Once | INTRAMUSCULAR | Status: DC

## 2024-06-18 MED ORDER — OXYCODONE HCL 5 MG PO TABS
5.0000 mg | ORAL_TABLET | ORAL | Status: DC | PRN
  Administered 2024-06-18 – 2024-06-19 (×3): 5 mg via ORAL
  Filled 2024-06-18 (×3): qty 1

## 2024-06-18 MED ORDER — OXYCODONE HCL 5 MG PO TABS: 5.0000 mg | ORAL_TABLET | Freq: Once | ORAL | Status: DC | PRN

## 2024-06-18 MED ORDER — ONDANSETRON HCL 4 MG/2ML IJ SOLN
INTRAMUSCULAR | Status: DC | PRN
  Administered 2024-06-18: 4 mg via INTRAVENOUS

## 2024-06-18 MED ORDER — HYDROMORPHONE HCL 1 MG/ML IJ SOLN: 0.2500 mg | INTRAMUSCULAR | Status: DC | PRN

## 2024-06-18 MED ORDER — MENTHOL 3 MG MT LOZG: 1.0000 | LOZENGE | OROMUCOSAL | Status: DC | PRN

## 2024-06-18 MED ORDER — TRANEXAMIC ACID-NACL 1000-0.7 MG/100ML-% IV SOLN
1000.0000 mg | INTRAVENOUS | Status: DC
  Filled 2024-06-18: qty 100

## 2024-06-18 MED ORDER — ACETAMINOPHEN 500 MG PO TABS
1000.0000 mg | ORAL_TABLET | Freq: Four times a day (QID) | ORAL | Status: DC
  Administered 2024-06-18 – 2024-06-19 (×4): 1000 mg via ORAL
  Filled 2024-06-18 (×4): qty 2

## 2024-06-18 MED ORDER — ALUM & MAG HYDROXIDE-SIMETH 200-200-20 MG/5ML PO SUSP: 30.0000 mL | ORAL | Status: DC | PRN

## 2024-06-18 MED ORDER — DIPHENHYDRAMINE HCL 12.5 MG/5ML PO ELIX
12.5000 mg | ORAL_SOLUTION | ORAL | Status: DC | PRN
  Administered 2024-06-19: 25 mg via ORAL
  Filled 2024-06-18: qty 10

## 2024-06-18 MED ORDER — ACETAMINOPHEN 10 MG/ML IV SOLN: 1000.0000 mg | Freq: Once | INTRAVENOUS | Status: DC | PRN

## 2024-06-18 MED ORDER — ONDANSETRON HCL 4 MG/2ML IJ SOLN: 4.0000 mg | Freq: Once | INTRAMUSCULAR | Status: DC | PRN

## 2024-06-18 MED ORDER — OXYCODONE HCL 5 MG/5ML PO SOLN: 5.0000 mg | Freq: Once | ORAL | Status: DC | PRN

## 2024-06-18 MED ORDER — PILOCARPINE HCL 1 % OP SOLN
1.0000 [drp] | Freq: Two times a day (BID) | OPHTHALMIC | Status: DC
  Administered 2024-06-19: 1 [drp] via OPHTHALMIC
  Filled 2024-06-18: qty 15

## 2024-06-18 MED ORDER — SODIUM CHLORIDE (PF) 0.9 % IJ SOLN
INTRAMUSCULAR | Status: DC | PRN
  Administered 2024-06-18: 61 mL

## 2024-06-18 MED ORDER — FENTANYL CITRATE (PF) 100 MCG/2ML IJ SOLN
INTRAMUSCULAR | Status: DC | PRN
  Administered 2024-06-18 (×2): 50 ug via INTRAVENOUS

## 2024-06-18 MED ORDER — TRANEXAMIC ACID-NACL 1000-0.7 MG/100ML-% IV SOLN
1000.0000 mg | Freq: Once | INTRAVENOUS | Status: AC
  Administered 2024-06-18: 1000 mg via INTRAVENOUS
  Filled 2024-06-18: qty 100

## 2024-06-18 MED ORDER — ORAL CARE MOUTH RINSE: 15.0000 mL | Freq: Once | OROMUCOSAL | Status: AC

## 2024-06-18 MED ORDER — SODIUM CHLORIDE 0.9 % IV SOLN: INTRAVENOUS | Status: DC

## 2024-06-18 MED ORDER — METHOCARBAMOL 500 MG PO TABS
500.0000 mg | ORAL_TABLET | Freq: Four times a day (QID) | ORAL | Status: DC | PRN
  Administered 2024-06-18 – 2024-06-19 (×2): 500 mg via ORAL
  Filled 2024-06-18 (×2): qty 1

## 2024-06-18 MED ORDER — ONDANSETRON HCL 4 MG/2ML IJ SOLN: 4.0000 mg | Freq: Four times a day (QID) | INTRAMUSCULAR | Status: DC | PRN

## 2024-06-18 MED ORDER — DEXAMETHASONE SOD PHOSPHATE PF 10 MG/ML IJ SOLN
INTRAMUSCULAR | Status: DC | PRN
  Administered 2024-06-18: 10 mg via INTRAVENOUS

## 2024-06-18 MED ORDER — MIDAZOLAM HCL 2 MG/2ML IJ SOLN
INTRAMUSCULAR | Status: AC
  Filled 2024-06-18: qty 2

## 2024-06-18 MED ORDER — METOCLOPRAMIDE HCL 5 MG/ML IJ SOLN: 5.0000 mg | Freq: Three times a day (TID) | INTRAMUSCULAR | Status: DC | PRN

## 2024-06-18 MED ORDER — OXYCODONE HCL 5 MG PO TABS: 10.0000 mg | ORAL_TABLET | ORAL | Status: DC | PRN

## 2024-06-18 MED ORDER — POVIDONE-IODINE 10 % EX SWAB: 2.0000 | Freq: Once | CUTANEOUS | Status: DC

## 2024-06-18 MED ORDER — PHENYLEPHRINE HCL (PRESSORS) 10 MG/ML IV SOLN
INTRAVENOUS | Status: AC
  Filled 2024-06-18: qty 1

## 2024-06-18 MED ORDER — ONDANSETRON HCL 4 MG PO TABS: 4.0000 mg | ORAL_TABLET | Freq: Four times a day (QID) | ORAL | Status: DC | PRN

## 2024-06-18 MED ORDER — MIDAZOLAM HCL (PF) 2 MG/2ML IJ SOLN
INTRAMUSCULAR | Status: DC | PRN
  Administered 2024-06-18: 2 mg via INTRAVENOUS

## 2024-06-18 MED ORDER — METHOCARBAMOL 1000 MG/10ML IJ SOLN: 500.0000 mg | Freq: Four times a day (QID) | INTRAMUSCULAR | Status: DC | PRN

## 2024-06-18 MED ORDER — ASPIRIN 81 MG PO CHEW
81.0000 mg | CHEWABLE_TABLET | Freq: Two times a day (BID) | ORAL | Status: DC
  Administered 2024-06-18 – 2024-06-19 (×2): 81 mg via ORAL
  Filled 2024-06-18 (×2): qty 1

## 2024-06-18 MED ORDER — PHENYLEPHRINE 80 MCG/ML (10ML) SYRINGE FOR IV PUSH (FOR BLOOD PRESSURE SUPPORT)
PREFILLED_SYRINGE | INTRAVENOUS | Status: DC | PRN
  Administered 2024-06-18: 80 ug via INTRAVENOUS

## 2024-06-18 MED ORDER — ONDANSETRON HCL 4 MG/2ML IJ SOLN
INTRAMUSCULAR | Status: AC
  Filled 2024-06-18: qty 2

## 2024-06-18 MED ORDER — STERILE WATER FOR IRRIGATION IR SOLN
Status: DC | PRN
  Administered 2024-06-18: 1000 mL

## 2024-06-18 MED ORDER — CHLORHEXIDINE GLUCONATE 0.12 % MT SOLN
15.0000 mL | Freq: Once | OROMUCOSAL | Status: AC
  Administered 2024-06-18: 15 mL via OROMUCOSAL

## 2024-06-18 MED ORDER — 0.9 % SODIUM CHLORIDE (POUR BTL) OPTIME
TOPICAL | Status: DC | PRN
  Administered 2024-06-18: 1000 mL

## 2024-06-18 MED ORDER — PROPOFOL 1000 MG/100ML IV EMUL
INTRAVENOUS | Status: AC
  Filled 2024-06-18: qty 100

## 2024-06-18 MED ORDER — AMISULPRIDE (ANTIEMETIC) 5 MG/2ML IV SOLN: 10.0000 mg | Freq: Once | INTRAVENOUS | Status: DC | PRN

## 2024-06-18 MED ORDER — PHENYLEPHRINE HCL-NACL 20-0.9 MG/250ML-% IV SOLN
INTRAVENOUS | Status: DC | PRN
  Administered 2024-06-18: 50 ug/min via INTRAVENOUS

## 2024-06-18 MED ORDER — CELECOXIB 200 MG PO CAPS
200.0000 mg | ORAL_CAPSULE | Freq: Two times a day (BID) | ORAL | Status: DC
  Administered 2024-06-18 – 2024-06-19 (×3): 200 mg via ORAL
  Filled 2024-06-18 (×3): qty 1

## 2024-06-18 NOTE — Evaluation (Signed)
 Physical Therapy Evaluation Patient Details Name: Michael Vaughan MRN: [id number] DOB: 11-24-1965 Today's Date: 06/18/2024  History of Present Illness  Pt is 59 yo male s/p L anterior THA on 06/18/24.  Pt with hx including but not limited to R THA, HTN, arthritis, hemochromatosis, GERD  Clinical Impression  Pt is s/p THA resulting in the deficits listed below (see PT Problem List). At baseline, pt is independent.  He has home support and DME.  Pt motivated and with good pain control.  Pt able to ambulate 100' with RW and CGA.  Pt with previous R THA and familiar with rehab process. Expected to progress very well, noted plan for HEP only at d/c.  Pt will benefit from acute skilled PT to increase their independence and safety with mobility to facilitate discharge.          If plan is discharge home, recommend the following: A little help with walking and/or transfers;A little help with bathing/dressing/bathroom;Assistance with cooking/housework;Help with stairs or ramp for entrance   Can travel by private vehicle        Equipment Recommendations None recommended by PT  Recommendations for Other Services       Functional Status Assessment Patient has had a recent decline in their functional status and demonstrates the ability to make significant improvements in function in a reasonable and predictable amount of time.     Precautions / Restrictions Precautions Precautions: Fall Restrictions Weight Bearing Restrictions Per Provider Order: Yes LLE Weight Bearing Per Provider Order: Weight bearing as tolerated      Mobility  Bed Mobility Overal bed mobility: Needs Assistance Bed Mobility: Supine to Sit     Supine to sit: Min assist     General bed mobility comments: assist for L LE only    Transfers Overall transfer level: Needs assistance Equipment used: Rolling walker (2 wheels) Transfers: Sit to/from Stand Sit to Stand: Contact guard assist                 Ambulation/Gait Ambulation/Gait assistance: Contact guard assist Gait Distance (Feet): 100 Feet Assistive device: Rolling walker (2 wheels) Gait Pattern/deviations: Step-to pattern, Decreased stride length Gait velocity: decreased but functioanl     General Gait Details: cues to push RW and for RW proximity  Stairs            Wheelchair Mobility     Tilt Bed    Modified Rankin (Stroke Patients Only)       Balance Overall balance assessment: Needs assistance Sitting-balance support: No upper extremity supported Sitting balance-Leahy Scale: Good     Standing balance support: No upper extremity supported, Bilateral upper extremity supported Standing balance-Leahy Scale: Fair Standing balance comment: Steady wtih RW to ambulate; could static stand without support                             Pertinent Vitals/Pain Pain Assessment Pain Assessment: 0-10 Pain Score: 5  Pain Location: L hip Pain Descriptors / Indicators: Discomfort Pain Intervention(s): Limited activity within patient's tolerance, Monitored during session, Premedicated before session, Repositioned, Other (comment) (significant improvement with transfers)    Home Living Family/patient expects to be discharged to:: Private residence Living Arrangements: Spouse/significant other Available Help at Discharge: Family;Available 24 hours/day Type of Home: House Home Access: Stairs to enter Entrance Stairs-Rails: Right Entrance Stairs-Number of Steps: 4   Home Layout: Two level;Able to live on main level with bedroom/bathroom Home Equipment: Cane - single point;Rolling  Walker (2 wheels);Rollator (4 wheels);BSC/3in1;Shower seat      Prior Function Prior Level of Function : Independent/Modified Independent;Driving             Mobility Comments: Could ambulate in community; did not need AD ADLs Comments: independent adls and iadls     Extremity/Trunk Assessment   Upper Extremity  Assessment Upper Extremity Assessment: Overall WFL for tasks assessed    Lower Extremity Assessment Lower Extremity Assessment: LLE deficits/detail LLE Deficits / Details: ROM WFL; MMT: ankle 5/5, knee 3/5 not further tested; hip 1/5    Cervical / Trunk Assessment Cervical / Trunk Assessment: Normal  Communication        Cognition Arousal: Alert Behavior During Therapy: WFL for tasks assessed/performed   PT - Cognitive impairments: No apparent impairments                       PT - Cognition Comments: pleasant and motivated Following commands: Intact       Cueing       General Comments General comments (skin integrity, edema, etc.): VSS    Exercises     Assessment/Plan    PT Assessment Patient needs continued PT services  PT Problem List Decreased strength;Decreased range of motion;Decreased activity tolerance;Decreased balance;Decreased mobility;Pain;Decreased knowledge of use of DME       PT Treatment Interventions DME instruction;Therapeutic exercise;Gait training;Stair training;Functional mobility training;Therapeutic activities;Patient/family education;Balance training;Modalities    PT Goals (Current goals can be found in the Care Plan section)  Acute Rehab PT Goals Patient Stated Goal: return home; walk quarter mile in a few weeks PT Goal Formulation: With patient/family Time For Goal Achievement: 07/02/24 Potential to Achieve Goals: Good    Frequency 7X/week     Co-evaluation               AM-PAC PT 6 Clicks Mobility  Outcome Measure Help needed turning from your back to your side while in a flat bed without using bedrails?: A Little Help needed moving from lying on your back to sitting on the side of a flat bed without using bedrails?: A Little Help needed moving to and from a bed to a chair (including a wheelchair)?: A Little Help needed standing up from a chair using your arms (e.g., wheelchair or bedside chair)?: A Little Help  needed to walk in hospital room?: A Little Help needed climbing 3-5 steps with a railing? : A Little 6 Click Score: 18    End of Session Equipment Utilized During Treatment: Gait belt Activity Tolerance: Patient tolerated treatment well Patient left: in chair;with chair alarm set;with call bell/phone within reach;with SCD's reapplied Nurse Communication: Mobility status PT Visit Diagnosis: Other abnormalities of gait and mobility (R26.89);Muscle weakness (generalized) (M62.81)    Time: 1543-1610 PT Time Calculation (min) (ACUTE ONLY): 27 min   Charges:   PT Evaluation $PT Eval Low Complexity: 1 Low PT Treatments $Gait Training: 8-22 mins PT General Charges $$ ACUTE PT VISIT: 1 Visit         Benjiman, PT Acute Rehab [facility] [phone]   Benjiman VEAR Mulberry 06/18/2024, 5:07 PM

## 2024-06-18 NOTE — Anesthesia Procedure Notes (Signed)
 Procedure Name: MAC Date/Time: 06/18/2024 11:06 AM  Performed by: Obadiah Reyes BROCKS, CRNAPre-anesthesia Checklist: Patient identified, Emergency Drugs available, Suction available, Patient being monitored and Timeout performed Patient Re-evaluated:Patient Re-evaluated prior to induction Oxygen Delivery Method: Simple face mask Preoxygenation: Pre-oxygenation with 100% oxygen Induction Type: IV induction Airway Equipment and Method: Oral airway

## 2024-06-18 NOTE — Discharge Instructions (Signed)

## 2024-06-18 NOTE — Anesthesia Procedure Notes (Signed)
 Spinal  Patient location during procedure: OR End time: 06/18/2024 11:06 AM Reason for block: surgical anesthesia  Staffing Performed: anesthesiologist  Authorized by: Jefm Garnette LABOR, MD   Performed by: Jefm Garnette LABOR, MD  Preanesthetic Checklist Completed: patient identified, IV checked, risks and benefits discussed, surgical consent, monitors and equipment checked, pre-op evaluation and timeout performed Spinal Block Patient position: sitting Prep: DuraPrep and site prepped and draped Patient monitoring: heart rate, cardiac monitor, continuous pulse ox and blood pressure Approach: midline Location: L3-4 Injection technique: single-shot Needle Needle type: Pencan  Needle gauge: 24 G Needle length: 10 cm Needle insertion depth (cm): 8 Assessment Sensory level: T4 Events: CSF return  Additional Notes 2 Attempt (s). Pt tolerated procedure well.

## 2024-06-18 NOTE — Interval H&P Note (Signed)
 History and Physical Interval Note:  06/18/2024 9:46 AM  Michael Vaughan  has presented today for surgery, with the diagnosis of Left hip osteoarthritis.  The various methods of treatment have been discussed with the patient and family. After consideration of risks, benefits and other options for treatment, the patient has consented to  Procedures: ARTHROPLASTY, HIP, TOTAL, ANTERIOR APPROACH (Left) as a surgical intervention.  The patient's history has been reviewed, patient examined, no change in status, stable for surgery.  I have reviewed the patient's chart and labs.  Questions were answered to the patient's satisfaction.     [name]

## 2024-06-18 NOTE — Op Note (Signed)
 MEDICAL RECORD NO.: [id number]      FACILITY:  [facility]      PHYSICIAN:  Michael JONETTA Vaughan  DATE OF BIRTH:  [birth year]     DATE OF PROCEDURE:  06/18/2024                                 OPERATIVE REPORT         PREOPERATIVE DIAGNOSIS: left hip osteoarthritis.      POSTOPERATIVE DIAGNOSIS:  left hip osteoarthritis.      PROCEDURE:  left total hip replacement through an anterior approach   utilizing DePuy THR system, component size 56 mm pinnacle cup, a size 36+4 neutral   Altrex liner, a size 5 Hi Actis stem with a 36+5 delta ceramic   ball.      SURGEON:  Michael Vaughan, [name]      ASSISTANT:  Rosina Calin, PA-C     ANESTHESIA:  Spinal.      SPECIMENS:  None.      COMPLICATIONS:  None.      BLOOD LOSS:  700 cc     DRAINS:  None.      INDICATION OF THE PROCEDURE:  Michael Vaughan is a 59 y.o. male who had   presented to office for evaluation of {left hip pain.  Radiographs revealed   progressive degenerative changes with bone-on-bone   articulation of the  hip joint, including subchondral cystic changes and osteophytes.  The patient had painful limited range of   motion significantly affecting their overall quality of life and function.  The patient was failing to    respond to conservative measures including medications and/or injections and activity modification and at this point was ready   to proceed with more definitive measures.  Consent was obtained for   benefit of pain relief.  Specific risks of infection, DVT, component   failure, dislocation, neurovascular injury, and need for revision surgery were reviewed in the office.     PROCEDURE IN DETAIL:  The patient was brought to operative theater.   Once adequate anesthesia, preoperative antibiotics, 2 gm of Ancef , 1 gm of Tranexamic Acid , and 10 mg of Decadron  were administered, the patient was positioned supine on the [employer] table.  Once the patient was safely positioned with adequate padding and  protection of bony prominences we predraped out the hip, and used fluoroscopy to confirm orientation of the pelvis.      The left hip was then prepped and draped from proximal iliac crest to   mid thigh with a shower curtain technique.      A time-out was performed identifying the patient, planned procedure, and the appropriate extremity.     An incision was then made 2 cm lateral to the   anterior superior iliac spine extending over the orientation of the   tensor fascia lata muscle and sharp dissection was carried down to the   fascia of the muscle.      The fascia was then incised.  The muscle belly was identified and swept   laterally and retractor placed along the superior neck.  Following   cauterization of the circumflex vessels and removing some pericapsular   fat, a second cobra retractor was placed on the inferior neck.  A T-capsulotomy was made along the line of the   superior neck to the trochanteric fossa, then extended proximally and   distally.  Tag  sutures were placed and the retractors were then placed   intracapsular.  We then identified the trochanteric fossa and   orientation of my neck cut and then made a neck osteotomy with the femur on traction.  The femoral   head was removed without difficulty or complication.  Traction was let   off and retractors were placed posterior and anterior around the   acetabulum.      The labrum and foveal tissue were debrided.  I began reaming with a 52 mm   reamer and reamed up to 55 mm reamer with good bony bed preparation and a 56 mm  cup was chosen.  The final 56 mm Pinnacle cup was then impacted under fluoroscopy to confirm the depth of penetration and orientation with respect to   Abduction and forward flexion.  A screw was placed into the ilium.  The final   36+4 neutral Altrex liner was impacted with good visualized rim fit.  The cup was positioned anatomically within the acetabular portion of the pelvis.      At this point,  the femur was rolled to 100 degrees.  Further capsule was   released off the inferior aspect of the femoral neck.  I then   released the superior capsule proximally.  With the leg in a neutral position the hook was placed laterally   along the femur under the vastus lateralis origin and elevated manually and then held in position using the hook attachment on the bed.  The leg was then extended and adducted with the leg rolled to 100   degrees of external rotation with traction off.  Retractors were placed along the medial calcar and posteriorly over the greater trochanter.  Once the proximal femur was fully   exposed, I used a box osteotome to set orientation.  I then began   broaching with the starting chili pepper broach and passed this by hand and then broached up to 5.  With the 5 broach in place I chose a high offset neck and did several trial reductions.  The offset was appropriate, leg lengths   appeared to be equal best matched with the +5 head ball trial confirmed radiographically.   Given these findings, I went ahead and dislocated the hip, repositioned all   retractors and positioned the left hip in the extended and abducted position.  The final 5 Hi Actis stem was   chosen and it was impacted down to the level of neck cut.  Based on this   and the trial reductions, a final 36+5 delta ceramic ball was chosen and   impacted onto a clean and dry trunnion, and the hip was reduced.  The   hip had been irrigated throughout the case again at this point.  I did   reapproximate the superior capsular leaflet to the anterior leaflet   using #1 Vicryl.  The fascia of the   tensor fascia lata muscle was then reapproximated using #1 Vicryl and #0 Stratafix sutures.  The   remaining wound was closed with 2-0 Vicryl and running 4-0 Monocryl.   The hip was cleaned, dried, and dressed sterilely using Dermabond and   Aquacel dressing.  The patient was then brought   to recovery room in stable condition  tolerating the procedure well.    PA Patti was present for the entirety of the case involved from   preoperative positioning, perioperative retractor management, general   facilitation of the case, as well as primary wound  closure as assistant.            Michael Vaughan, [name]        06/18/2024 12:09 PM

## 2024-06-18 NOTE — Anesthesia Postprocedure Evaluation (Signed)
"   Anesthesia Post Note  Patient: Michael Vaughan  Procedure(s) Performed: ARTHROPLASTY, HIP, TOTAL, ANTERIOR APPROACH (Left: Hip)     Patient location during evaluation: Nursing Unit Anesthesia Type: Spinal Level of consciousness: oriented and awake and alert Pain management: pain level controlled Vital Signs Assessment: post-procedure vital signs reviewed and stable Respiratory status: spontaneous breathing and respiratory function stable Cardiovascular status: blood pressure returned to baseline and stable Postop Assessment: no headache, no backache, no apparent nausea or vomiting and patient able to bend at knees Anesthetic complications: no   No notable events documented.  Last Vitals:  Vitals:   06/18/24 1400 06/18/24 1415  BP: 117/84 (!) 151/86  Pulse: 75 66  Resp: 15 18  Temp: (!) 36.4 C 36.4 C  SpO2: 97% 99%    Last Pain:  Vitals:   06/18/24 1435  TempSrc:   PainSc: 9                  [name] A Tafari Humiston      "

## 2024-06-18 NOTE — Transfer of Care (Signed)
 Immediate Anesthesia Transfer of Care Note  Patient: Michael Vaughan  Procedure(s) Performed: ARTHROPLASTY, HIP, TOTAL, ANTERIOR APPROACH (Left: Hip)  Patient Location: PACU  Anesthesia Type:Spinal  Level of Consciousness: sedated and responds to stimulation  Airway & Oxygen Therapy: Patient Spontanous Breathing and Patient connected to face mask oxygen  Post-op Assessment: Report given to RN and Post -op Vital signs reviewed and stable  Post vital signs: Reviewed and stable  Last Vitals:  Vitals Value Taken Time  BP 103/65 06/18/24 12:32  Temp    Pulse 49 06/18/24 12:33  Resp 13 06/18/24 12:33  SpO2 100 % 06/18/24 12:33  Vitals shown include unfiled device data.  Last Pain:  Vitals:   06/18/24 0847  TempSrc:   PainSc: 6       Patients Stated Pain Goal: 5 (06/18/24 0847)  Complications: No notable events documented.

## 2024-06-18 NOTE — Care Plan (Signed)
 Ortho Bundle Case Management Note  Patient Details  Name: Michael Vaughan MRN: [id number] Date of Birth: 11-Feb-1966  L THA on 06/18/24  DCP: Home with wife  DME: No needs  PT: HEP                   DME Arranged:  N/A DME Agency:  NA  HH Arranged:    HH Agency:     Additional Comments: Please contact me with any questions of if this plan should need to change.  Lyle Pepper, CCM [facility] [phone]  Ext. [phone]   06/18/2024, 1:32 PM

## 2024-06-19 ENCOUNTER — Ambulatory Visit: Admitting: Hematology and Oncology

## 2024-06-19 ENCOUNTER — Other Ambulatory Visit

## 2024-06-19 ENCOUNTER — Encounter (HOSPITAL_COMMUNITY): Payer: Self-pay | Admitting: Orthopedic Surgery

## 2024-06-19 ENCOUNTER — Encounter: Payer: Self-pay | Admitting: Hematology and Oncology

## 2024-06-19 ENCOUNTER — Other Ambulatory Visit (HOSPITAL_COMMUNITY): Payer: Self-pay

## 2024-06-19 ENCOUNTER — Inpatient Hospital Stay

## 2024-06-19 DIAGNOSIS — M1612 Unilateral primary osteoarthritis, left hip: Secondary | ICD-10-CM | POA: Diagnosis not present

## 2024-06-19 LAB — CBC
HCT: 29.4 % — ABNORMAL LOW (ref 39.0–52.0)
Hemoglobin: 10.5 g/dL — ABNORMAL LOW (ref 13.0–17.0)
MCH: 32.3 pg (ref 26.0–34.0)
MCHC: 35.7 g/dL (ref 30.0–36.0)
MCV: 90.5 fL (ref 80.0–100.0)
Platelets: 242 K/uL (ref 150–400)
RBC: 3.25 MIL/uL — ABNORMAL LOW (ref 4.22–5.81)
RDW: 13.4 % (ref 11.5–15.5)
WBC: 14.3 K/uL — ABNORMAL HIGH (ref 4.0–10.5)
nRBC: 0 % (ref 0.0–0.2)

## 2024-06-19 LAB — BASIC METABOLIC PANEL WITH GFR
Anion gap: 10 (ref 5–15)
BUN: 16 mg/dL (ref 6–20)
CO2: 21 mmol/L — ABNORMAL LOW (ref 22–32)
Calcium: 8.3 mg/dL — ABNORMAL LOW (ref 8.9–10.3)
Chloride: 104 mmol/L (ref 98–111)
Creatinine, Ser: 0.94 mg/dL (ref 0.61–1.24)
GFR, Estimated: >60 mL/min
Glucose, Bld: 206 mg/dL — ABNORMAL HIGH (ref 70–99)
Potassium: 3.7 mmol/L (ref 3.5–5.1)
Sodium: 135 mmol/L (ref 135–145)

## 2024-06-19 MED ORDER — ASPIRIN 81 MG PO CHEW
81.0000 mg | CHEWABLE_TABLET | Freq: Two times a day (BID) | ORAL | 0 refills | Status: AC
  Filled 2024-06-19: qty 56, 28d supply, fill #0

## 2024-06-19 MED ORDER — OXYCODONE HCL 5 MG PO TABS
5.0000 mg | ORAL_TABLET | ORAL | 0 refills | Status: AC | PRN
  Filled 2024-06-19: qty 42, 7d supply, fill #0

## 2024-06-19 MED ORDER — METHOCARBAMOL 500 MG PO TABS
500.0000 mg | ORAL_TABLET | Freq: Four times a day (QID) | ORAL | 0 refills | Status: AC | PRN
  Filled 2024-06-19: qty 40, 10d supply, fill #0

## 2024-06-19 MED ORDER — CELECOXIB 200 MG PO CAPS
200.0000 mg | ORAL_CAPSULE | Freq: Two times a day (BID) | ORAL | 0 refills | Status: AC
  Filled 2024-06-19: qty 60, 30d supply, fill #0

## 2024-06-19 MED ORDER — POLYETHYLENE GLYCOL 3350 17 GM/SCOOP PO POWD
17.0000 g | Freq: Two times a day (BID) | ORAL | 0 refills | Status: AC
  Filled 2024-06-19: qty 238, 7d supply, fill #0

## 2024-06-19 MED ORDER — SENNA 8.6 MG PO TABS
2.0000 | ORAL_TABLET | Freq: Every day | ORAL | 0 refills | Status: AC
  Filled 2024-06-19: qty 28, 14d supply, fill #0

## 2024-06-19 NOTE — Progress Notes (Signed)
 Discharge medications delivered to patient at the bedside in a secure bag.

## 2024-06-19 NOTE — Progress Notes (Signed)
 Physical Therapy Treatment Patient Details Name: Michael Vaughan MRN: [id number] DOB: [birth year] Today's Date: 06/19/2024   History of Present Illness Pt is 59 yo male s/p L anterior THA on 06/18/24.  Pt with hx including but not limited to R THA, HTN, arthritis, hemochromatosis, GERD    PT Comments  pATIENT HAS AMBULATED, PRACTICED STEPS, INSTRUCTED AND TEACH BACK hep. pATIENT HAS MET pt GOALS FOR dc.     If plan is discharge home, recommend the following: A little help with walking and/or transfers;A little help with bathing/dressing/bathroom;Assistance with cooking/housework;Help with stairs or ramp for entrance   Can travel by private vehicle        Equipment Recommendations  None recommended by PT    Recommendations for Other Services       Precautions / Restrictions Precautions Precautions: Fall Restrictions LLE Weight Bearing Per Provider Order: Weight bearing as tolerated     Mobility  Bed Mobility               General bed mobility comments: in recliner    Transfers   Equipment used: Rolling walker (2 wheels) Transfers: Sit to/from Stand Sit to Stand: Supervision                Ambulation/Gait Ambulation/Gait assistance: Supervision Gait Distance (Feet): 300 Feet Assistive device: Rolling walker (2 wheels) Gait Pattern/deviations: Step-through pattern       General Gait Details: cues to push RW and for RW proximity   Stairs Stairs: Yes Stairs assistance: Supervision Stair Management: Step to pattern Number of Stairs: 2 (and 3) General stair comments: cues for sequence   Wheelchair Mobility     Tilt Bed    Modified Rankin (Stroke Patients Only)       Balance Overall balance assessment: Mild deficits observed, not formally tested                                          Communication    Cognition Arousal: Alert                                      Cueing    Exercises Total Joint  Exercises Ankle Circles/Pumps: AROM, Both, 10 reps Heel Slides: AAROM, Left, 5 reps Hip ABduction/ADduction: AAROM, Left, 5 reps Long Arc Quad: AROM, Left, 10 reps    General Comments        Pertinent Vitals/Pain Pain Assessment Pain Score: 2  Pain Location: L hip Pain Descriptors / Indicators: Discomfort Pain Intervention(s): Monitored during session, Premedicated before session    Home Living                          Prior Function            PT Goals (current goals can now be found in the care plan section) Progress towards PT goals: Progressing toward goals    Frequency    7X/week      PT Plan      Co-evaluation              AM-PAC PT 6 Clicks Mobility   Outcome Measure  Help needed turning from your back to your side while in a flat bed without using bedrails?: A Little Help needed moving from lying on your back  to sitting on the side of a flat bed without using bedrails?: A Little Help needed moving to and from a bed to a chair (including a wheelchair)?: A Little Help needed standing up from a chair using your arms (e.g., wheelchair or bedside chair)?: A Little Help needed to walk in hospital room?: A Little Help needed climbing 3-5 steps with a railing? : A Little 6 Click Score: 18    End of Session Equipment Utilized During Treatment: Gait belt Activity Tolerance: Patient tolerated treatment well Patient left: in chair;with call bell/phone within reach;with family/visitor present Nurse Communication: Mobility status (PT gaols met) PT Visit Diagnosis: Other abnormalities of gait and mobility (R26.89);Muscle weakness (generalized) (M62.81)     Time: 9150-9060 PT Time Calculation (min) (ACUTE ONLY): 50 min  Charges:    $Gait Training: 8-22 mins $Therapeutic Exercise: 8-22 mins $Self Care/Home Management: 8-22 PT General Charges $$ ACUTE PT VISIT: 1 Visit                     Darice Potters PT Acute Rehabilitation  Services Office [phone]    Potters Darice Norris 06/19/2024, 9:34 AM

## 2024-06-19 NOTE — Progress Notes (Signed)
 Patient ID: Michael Vaughan, male   DOB: 01/29/1966, 58 y.o.   MRN: [id number] Subjective: 1 Day Post-Op Procedures (LRB): ARTHROPLASTY, HIP, TOTAL, ANTERIOR APPROACH (Left)    Patient reports pain as mild to moderate after initial post op pain from spinal wearing off. Otherwise doing well this morning His wife in the room with him this morning  Objective:   VITALS:   Vitals:   06/19/24 0125 06/19/24 0512  BP: (!) 141/81 (!) 140/73  Pulse: 87 69  Resp: 15 18  Temp: 98.7 F (37.1 C) 97.8 F (36.6 C)  SpO2: 96% 96%    Neurovascular intact Incision: dressing C/D/I  LABS Recent Labs    06/19/24 0340  HGB 10.5*  HCT 29.4*  WBC 14.3*  PLT 242    Recent Labs    06/19/24 0340  NA 135  K 3.7  BUN 16  CREATININE 0.94  GLUCOSE 206*    No results for input(s): LABPT, INR in the last 72 hours.   Assessment/Plan: 1 Day Post-Op Procedures (LRB): ARTHROPLASTY, HIP, TOTAL, ANTERIOR APPROACH (Left)   Advance diet Up with therapy Home today after therapy RTC in 2 weeks Will have his meds delivered to room prior to discharge

## 2024-06-19 NOTE — TOC Transition Note (Signed)
 Transition of Care [facility]) - Discharge Note   Patient Details  Name: Michael Vaughan MRN: [id number] Date of Birth: 1965-08-27  Transition of [facility]) CM/SW Contact:  Alfonse JONELLE Rex, RN Phone Number: 06/19/2024, 10:19 AM   Clinical Narrative:   Admitted for scheduled L THA on 06/18/24, HEP, has RW. No INPT CM needs.     Final next level of care: Home/Self Care Barriers to Discharge: No Barriers Identified   Patient Goals and CMS Choice Patient states their goals for this hospitalization and ongoing recovery are:: return home          Discharge Placement                       Discharge Plan and Services Additional resources added to the After Visit Summary for                  DME Arranged: N/A DME Agency: NA                  Social Drivers of Health (SDOH) Interventions SDOH Screenings   Food Insecurity: No Food Insecurity (06/18/2024)  Housing: Low Risk (06/18/2024)  Transportation Needs: No Transportation Needs (06/18/2024)  Utilities: Not At Risk (06/18/2024)  Alcohol Screen: Low Risk (04/28/2024)  Depression (PHQ2-9): Low Risk (04/30/2024)  Financial Resource Strain: Low Risk (04/28/2024)  Physical Activity: Inactive (04/28/2024)  Social Connections: Socially Integrated (06/18/2024)  Stress: No Stress Concern Present (04/28/2024)  Tobacco Use: High Risk (06/18/2024)  Health Literacy: Adequate Health Literacy (12/26/2023)     Readmission Risk Interventions     No data to display

## 2024-06-20 ENCOUNTER — Inpatient Hospital Stay: Attending: Hematology and Oncology

## 2024-06-20 ENCOUNTER — Inpatient Hospital Stay: Admitting: Hematology and Oncology

## 2024-06-20 NOTE — Progress Notes (Signed)
 No show

## 2024-06-24 ENCOUNTER — Ambulatory Visit: Admitting: Hematology and Oncology

## 2024-06-24 ENCOUNTER — Inpatient Hospital Stay

## 2024-06-24 ENCOUNTER — Other Ambulatory Visit

## 2024-06-27 NOTE — Discharge Summary (Signed)
 Patient ID: Michael Vaughan MRN: [id number] DOB/AGE: 60/06/1965 60 y.o.  Admit date: 06/18/2024 Discharge date: 06/19/2024  Admission Diagnoses:  Left hip osteoarthritis  Discharge Diagnoses:  Principal Problem:   S/P total left hip arthroplasty   Past Medical History:  Diagnosis Date   Allergy    Arthralgia    takes aleve regularly   Arthritis    CAP (community acquired pneumonia) 12/02/2023   Cataract    GERD (gastroesophageal reflux disease)    Heartburn    Hereditary hemochromatosis (HCC) 05/2013   iron overload with mild HM, homozygous for C282Y, monthly blood draws   History of chicken pox    History of kidney stones    History of migraine 1990s   remote   Hypertension    Legionnaire's disease (HCC) 12/08/2023   Retinitis pigmentosa     Surgeries: Procedures: ARTHROPLASTY, HIP, TOTAL, ANTERIOR APPROACH on 06/18/2024   Consultants:   Discharged Condition: Improved  Hospital Course: Michael Vaughan is an 59 y.o. male who was admitted 06/18/2024 for operative treatment ofS/P total left hip arthroplasty. Patient has severe unremitting pain that affects sleep, daily activities, and work/hobbies. After pre-op clearance the patient was taken to the operating room on 06/18/2024 and underwent  Procedures: ARTHROPLASTY, HIP, TOTAL, ANTERIOR APPROACH.    Patient was given perioperative antibiotics:  Anti-infectives (From admission, onward)    Start     Dose/Rate Route Frequency Ordered Stop   06/18/24 1700  ceFAZolin  (ANCEF ) IVPB 2g/100 mL premix        2 g 200 mL/hr over 30 Minutes Intravenous Every 6 hours 06/18/24 1426 06/19/24 0742   06/18/24 0845  ceFAZolin  (ANCEF ) IVPB 2g/100 mL premix        2 g 200 mL/hr over 30 Minutes Intravenous On call to O.R. 06/18/24 9164 06/18/24 1107        Patient was given sequential compression devices, early ambulation, and chemoprophylaxis to prevent DVT. Patient worked with PT and was meeting their goals regarding safe ambulation and  transfers.  Patient benefited maximally from hospital stay and there were no complications.    Recent vital signs: No data found.   Recent laboratory studies: No results for input(s): WBC, HGB, HCT, PLT, NA, K, CL, CO2, BUN, CREATININE, GLUCOSE, INR, CALCIUM in the last 72 hours.  Invalid input(s): PT, 2   Discharge Medications:   Allergies as of 06/19/2024       Reactions   Augmentin [amoxicillin-pot Clavulanate] Shortness Of Breath, Swelling, Rash   No problems with amox        Medication List     STOP taking these medications    diclofenac 75 MG EC tablet Commonly known as: VOLTAREN       TAKE these medications    acetaminophen  500 MG tablet Commonly known as: TYLENOL  Take 500 mg by mouth every 6 (six) hours as needed.   amLODipine  5 MG tablet Commonly known as: NORVASC  TAKE 1 TABLET (5 MG TOTAL) BY MOUTH DAILY.   Aspirin  Low Dose 81 MG chewable tablet Generic drug: aspirin  Chew 1 tablet (81 mg total) by mouth 2 (two) times daily for 28 days.   celecoxib  200 MG capsule Commonly known as: CELEBREX  Take 1 capsule (200 mg total) by mouth 2 (two) times daily.   methocarbamol  500 MG tablet Commonly known as: ROBAXIN  Take 1 tablet (500 mg total) by mouth every 6 (six) hours as needed for muscle spasms.   oxyCODONE  5 MG immediate release tablet Commonly known as: Oxy IR/ROXICODONE  Take  1 tablet (5 mg total) by mouth every 4 (four) hours as needed for severe pain (pain score 7-10).   pilocarpine  1 % ophthalmic solution Commonly known as: PILOCAR Place 1 drop into the left eye 2 (two) times daily.   polyethylene glycol powder 17 GM/SCOOP powder Commonly known as: GLYCOLAX /MIRALAX  Dissolve 1 capful (17g) in 4-8 ounces of liquid and take by mouth 2 (two) times daily.   senna 8.6 MG Tabs tablet Commonly known as: SENOKOT Take 2 tablets (17.2 mg total) by mouth at bedtime for 14 days.               Discharge Care  Instructions  (From admission, onward)           Start     Ordered   06/19/24 0000  Change dressing       Comments: Maintain surgical dressing until follow up in the clinic. If the edges start to pull up, may reinforce with tape. If the dressing is no longer working, may remove and cover with gauze and tape, but must keep the area dry and clean.  Call with any questions or concerns.   06/19/24 0750            Diagnostic Studies: DG Pelvis Portable Result Date: 06/18/2024 EXAM: 1 or 2 VIEW(S) XRAY OF THE PELVIS 06/18/2024 01:21:00 PM COMPARISON: Surgical fluoroscopy 06/18/2024. CLINICAL HISTORY: S/P total hip arthroplasty. FINDINGS: BONES AND JOINTS: Left total hip arthroplasty noted. Hardware components in anatomic alignment. Pre-existing right hip arthroplasty noted. No acute fracture. SOFT TISSUES: Soft tissue emphysema overlying the left hip consistent with postoperative change. IMPRESSION: 1. Left total hip arthroplasty with hardware components in anatomic alignment. 2. Soft tissue emphysema overlying the left hip, consistent with postoperative change. Electronically signed by: Elsie Gravely MD 06/18/2024 08:32 PM EST RP Workstation: [id number]   DG HIP UNILAT WITH PELVIS 1V LEFT Result Date: 06/18/2024 EXAM: 1 VIEW(S) XRAY OF THE LEFT HIP 06/18/2024 12:07:00 PM COMPARISON: None available. CLINICAL HISTORY: [id number] Surgery, elective [id number] Surgery, elective [phone] FINDINGS: Intraoperative fluoroscopy was utilized for surgical control purposes. Fluoroscopy time: 10 seconds. Dose: 2.24 mGy. 2 spot images were provided. Images demonstrate placement of a left hip arthroplasty. IMPRESSION: 1. Intraoperative fluoroscopy was obtained for surgical control purposes, demonstrating placement of a left hip arthroplasty. Electronically signed by: Elsie Gravely MD 06/18/2024 08:31 PM EST RP Workstation: [id number]    Disposition: Discharge disposition: 01-Home or Self Care       Discharge  Instructions     Call MD / Call 911   Complete by: As directed    If you experience chest pain or shortness of breath, CALL 911 and be transported to the hospital emergency room.  If you develope a fever above 101 F, pus (white drainage) or increased drainage or redness at the wound, or calf pain, call your surgeon's office.   Change dressing   Complete by: As directed    Maintain surgical dressing until follow up in the clinic. If the edges start to pull up, may reinforce with tape. If the dressing is no longer working, may remove and cover with gauze and tape, but must keep the area dry and clean.  Call with any questions or concerns.   Constipation Prevention   Complete by: As directed    Drink plenty of fluids.  Prune juice may be helpful.  You may use a stool softener, such as Colace (over the counter) 100 mg twice a day.  Use MiraLax  (over the counter)  for constipation as needed.   Diet - low sodium heart healthy   Complete by: As directed    Increase activity slowly as tolerated   Complete by: As directed    Weight bearing as tolerated with assist device (walker, cane, etc) as directed, use it as long as suggested by your surgeon or therapist, typically at least 4-6 weeks.   Post-operative opioid taper instructions:   Complete by: As directed    POST-OPERATIVE OPIOID TAPER INSTRUCTIONS: It is important to wean off of your opioid medication as soon as possible. If you do not need pain medication after your surgery it is ok to stop day one. Opioids include: Codeine, Hydrocodone (Norco, Vicodin), Oxycodone (Percocet, oxycontin ) and hydromorphone  amongst others.  Long term and even short term use of opiods can cause: Increased pain response Dependence Constipation Depression Respiratory depression And more.  Withdrawal symptoms can include Flu like symptoms Nausea, vomiting And more Techniques to manage these symptoms Hydrate well Eat regular healthy meals Stay active Use  relaxation techniques(deep breathing, meditating, yoga) Do Not substitute Alcohol to help with tapering If you have been on opioids for less than two weeks and do not have pain than it is ok to stop all together.  Plan to wean off of opioids This plan should start within one week post op of your joint replacement. Maintain the same interval or time between taking each dose and first decrease the dose.  Cut the total daily intake of opioids by one tablet each day Next start to increase the time between doses. The last dose that should be eliminated is the evening dose.      TED hose   Complete by: As directed    Use stockings (TED hose) for 2 weeks on both leg(s).  You may remove them at night for sleeping.        Follow-up Information     Patti Rosina SAUNDERS, PA-C. Go on 07/03/2024.   Specialty: Orthopedic Surgery Why: You are scheduled for a post op appointment on Wednesday 07/03/24 at 2:15pm Contact information: [address] [place] [address] [phone]                  Signed: Rosina SAUNDERS Patti 06/27/2024, 6:59 AM

## 2024-07-02 ENCOUNTER — Encounter: Payer: Self-pay | Admitting: Hematology and Oncology

## 2024-07-03 ENCOUNTER — Telehealth: Payer: Self-pay | Admitting: Hematology and Oncology

## 2024-07-03 NOTE — Telephone Encounter (Signed)
 i spoke to pt about scheduling future appt

## 2024-07-25 ENCOUNTER — Encounter (INDEPENDENT_AMBULATORY_CARE_PROVIDER_SITE_OTHER): Payer: Medicare PPO | Admitting: Ophthalmology

## 2024-12-23 ENCOUNTER — Ambulatory Visit: Admitting: Dermatology

## 2024-12-24 ENCOUNTER — Other Ambulatory Visit

## 2024-12-26 ENCOUNTER — Ambulatory Visit

## 2024-12-27 ENCOUNTER — Ambulatory Visit

## 2025-01-01 ENCOUNTER — Encounter: Admitting: Family Medicine

## 2025-01-03 ENCOUNTER — Inpatient Hospital Stay: Admitting: Hematology and Oncology

## 2025-01-03 ENCOUNTER — Inpatient Hospital Stay
